# Patient Record
Sex: Female | Born: 1990 | Race: White | Hispanic: No | Marital: Married | Smoking: Never smoker
Health system: Southern US, Community
[De-identification: ages and names within clinical notes are randomized; demographics above are authoritative.]

## PROBLEM LIST (undated history)

## (undated) ENCOUNTER — Emergency Department (HOSPITAL_COMMUNITY): Discharge status: Absent without leave | Payer: Managed Care, Other (non HMO)

## (undated) DIAGNOSIS — F329 Major depressive disorder, single episode, unspecified: Secondary | ICD-10-CM

## (undated) DIAGNOSIS — C801 Malignant (primary) neoplasm, unspecified: Secondary | ICD-10-CM

## (undated) DIAGNOSIS — Z789 Other specified health status: Secondary | ICD-10-CM

## (undated) DIAGNOSIS — F32A Depression, unspecified: Secondary | ICD-10-CM

## (undated) DIAGNOSIS — F419 Anxiety disorder, unspecified: Secondary | ICD-10-CM

## (undated) HISTORY — PX: DILATION AND CURETTAGE OF UTERUS: SHX78

## (undated) HISTORY — PX: NO PAST SURGERIES: SHX2092

## (undated) MED FILL — Dexamethasone Sodium Phosphate Inj 100 MG/10ML: INTRAMUSCULAR | Qty: 1 | Status: AC

## (undated) MED FILL — Fosaprepitant Dimeglumine For IV Infusion 150 MG (Base Eq): INTRAVENOUS | Qty: 5 | Status: AC

---
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
## 2013-05-01 NOTE — L&D Delivery Note (Signed)
Delivery Note  Angel Tate is a 23 y.o. female G1 at 84 0/7 weeks presenting for c/o contractions.  Pregnancy complicated by:  1) Low back pain- tylenol prn  2) Anemia in pregnancy- Hgb 10.7, integra daily, last check 8/27: Hgb 11.6  Pt started having contractions ~ 2 days ago, she has been to MAU x2 for early labor and upon arrival for this admission she was noted to be 4cm dilated with contractions q 1-56min.  Pt received an epidural for pain management.  She was augmented with Pitocin and an IUPC to monitor contractions.  She progressed to complete dilation.  At 4:36 PM a viable and healthy female was delivered via Vaginal, Spontaneous Delivery (Presentation: Left Occiput Anterior).  APGAR: 8, 9; weight 8 lb 1.1 oz (3660 g).   Placenta status: Intact, Spontaneous.  Anesthesia: Epidural  Episiotomy: None Lacerations: 2nd degree Suture Repair: 2.0 3.0 vicryl Est. Blood Loss (mL): 350  Mom to postpartum.  Baby to Couplet care / Skin to Skin.  Janyth Pupa, M 01/22/2014, 8:01 PM

## 2013-06-02 LAB — OB RESULTS CONSOLE HEPATITIS B SURFACE ANTIGEN: Hepatitis B Surface Ag: NEGATIVE

## 2013-06-02 LAB — OB RESULTS CONSOLE ANTIBODY SCREEN: Antibody Screen: NEGATIVE

## 2013-06-02 LAB — OB RESULTS CONSOLE RUBELLA ANTIBODY, IGM: Rubella: IMMUNE

## 2013-06-02 LAB — OB RESULTS CONSOLE ABO/RH: RH Type: POSITIVE

## 2013-06-02 LAB — OB RESULTS CONSOLE HIV ANTIBODY (ROUTINE TESTING): HIV: NONREACTIVE

## 2013-06-02 LAB — OB RESULTS CONSOLE RPR: RPR: NONREACTIVE

## 2013-06-27 ENCOUNTER — Other Ambulatory Visit (HOSPITAL_COMMUNITY)
Admission: RE | Admit: 2013-06-27 | Discharge: 2013-06-27 | Disposition: A | Discharge status: Home / Self Care | Payer: Commercial Indemnity | Source: Ambulatory Visit | Attending: Obstetrics & Gynecology | Admitting: Obstetrics & Gynecology

## 2013-06-27 DIAGNOSIS — Z113 Encounter for screening for infections with a predominantly sexual mode of transmission: Secondary | ICD-10-CM | POA: Insufficient documentation

## 2013-06-27 DIAGNOSIS — Z124 Encounter for screening for malignant neoplasm of cervix: Secondary | ICD-10-CM | POA: Insufficient documentation

## 2013-06-27 DIAGNOSIS — N760 Acute vaginitis: Secondary | ICD-10-CM | POA: Insufficient documentation

## 2013-12-16 LAB — OB RESULTS CONSOLE GBS: GBS: NEGATIVE

## 2014-01-20 ENCOUNTER — Inpatient Hospital Stay (HOSPITAL_COMMUNITY)
Admission: AD | Admit: 2014-01-20 | Discharge: 2014-01-21 | Disposition: A | Discharge status: Home / Self Care | Payer: Managed Care, Other (non HMO) | Source: Ambulatory Visit | Attending: Obstetrics and Gynecology | Admitting: Obstetrics and Gynecology

## 2014-01-20 ENCOUNTER — Encounter (HOSPITAL_COMMUNITY): Payer: Self-pay | Admitting: *Deleted

## 2014-01-20 ENCOUNTER — Inpatient Hospital Stay (HOSPITAL_COMMUNITY): Payer: Managed Care, Other (non HMO)

## 2014-01-20 ENCOUNTER — Inpatient Hospital Stay (HOSPITAL_COMMUNITY)
Admission: AD | Admit: 2014-01-20 | Discharge: 2014-01-20 | Disposition: A | Discharge status: Home / Self Care | Payer: Managed Care, Other (non HMO) | Source: Ambulatory Visit | Attending: Obstetrics and Gynecology | Admitting: Obstetrics and Gynecology

## 2014-01-20 ENCOUNTER — Telehealth (HOSPITAL_COMMUNITY): Payer: Self-pay | Admitting: *Deleted

## 2014-01-20 DIAGNOSIS — O479 False labor, unspecified: Secondary | ICD-10-CM | POA: Insufficient documentation

## 2014-01-20 DIAGNOSIS — O9989 Other specified diseases and conditions complicating pregnancy, childbirth and the puerperium: Principal | ICD-10-CM

## 2014-01-20 DIAGNOSIS — O4290 Premature rupture of membranes, unspecified as to length of time between rupture and onset of labor, unspecified weeks of gestation: Secondary | ICD-10-CM

## 2014-01-20 DIAGNOSIS — O99891 Other specified diseases and conditions complicating pregnancy: Secondary | ICD-10-CM | POA: Insufficient documentation

## 2014-01-20 HISTORY — DX: Other specified health status: Z78.9

## 2014-01-20 LAB — AMNISURE RUPTURE OF MEMBRANE (ROM) NOT AT ARMC: Amnisure ROM: NEGATIVE

## 2014-01-20 LAB — OB RESULTS CONSOLE GC/CHLAMYDIA
Chlamydia: NEGATIVE
Gonorrhea: NEGATIVE

## 2014-01-20 NOTE — MAU Note (Signed)
Pt reports having several episiodes of leaking today. Sent from office.

## 2014-01-20 NOTE — Telephone Encounter (Signed)
Preadmission screen  

## 2014-01-20 NOTE — H&P (Signed)
HPI: 23 y/o G1P0 @ [redacted]w[redacted]d estimated gestational age (as dated by LMP c/w 20 week ultrasound) who was seen in the office today and reports slow leaking of fluid that has continued every time she stands up.  In office, unable to confirm rupture due to bloody show though findings suggestive of gross rupture.  Irregular contractions, + Fetal Movement.  ROS: no HA, no epigastric pain, no visual changes.    Pregnancy complicated by: 1) Low back pain- tylenol prn 2) Anemia in pregnancy- Hgb 10.7, integra daily, last check 8/27: Hgb 11.6   Prenatal Transfer Tool  Maternal Diabetes: No Genetic Screening: Normal Maternal Ultrasounds/Referrals: Normal Fetal Ultrasounds or other Referrals:  None Maternal Substance Abuse:  No Significant Maternal Medications:  None Significant Maternal Lab Results: Lab values include: Group B Strep negative   PNL:  GBS negaitve, Rub Immune, Hep B neg, RPR NR, HIV neg, GC/C neg, glucola:normal Tetra negative H/H: 11.6/35.6 Blood type: O positive Rc'd Tdap Last [place] @ 28wk (concern for S<D)- vertex, anterior, AFI wnl, EFW: 66%, female  OBHx: primip PMHx:  Low back pain- xray 03/2013 irregular lordosis Meds:  PNV, tylenol prn, pepcid prn for indigestion Allergy:  NKDA SurgHx: no SocHx:   no Tobacco, no  EtOH, no Illicit Drugs  O: LMP 61/60/7371 in office: BP 118/77, Wt: 170lbs Gen. AAOx3, NAD Abd. Gravid,  no tenderness,  no rigidity,  no guarding Extr.  no edema B/L , no calf tenderness bilaterally FHT: 140 by doppler SSE: Slight pooling of bloody/clear discharge, cervix does not appear dilated SVE: 1/25/-3, vertex on exam with palpable suture, no bag appreciated  Labs: see orders  A/P:  23 y.o. G1P0 @ [redacted]w[redacted]d EGA who presents for rule out PROM -FWB: continous monitoring, reassuring by doppler in office -Rule out PROM: plan for amnisure.  If confirmed rupture will admit with plans for Pitocin -GBS: negative -Anemia in pregnancy- CBC, T&S to be drawn for  admission -Dr. Landry Mellow to manage until 7pm, [facility] for care this evening  Janyth Pupa, DO [phone] (pager) [phone] (office)

## 2014-01-20 NOTE — MAU Note (Signed)
Worsening contractions tonight.

## 2014-01-20 NOTE — MAU Note (Signed)
Pt to be reassessed in 1 hour after walking.

## 2014-01-21 ENCOUNTER — Other Ambulatory Visit: Payer: Self-pay | Admitting: Obstetrics and Gynecology

## 2014-01-21 ENCOUNTER — Inpatient Hospital Stay (HOSPITAL_COMMUNITY): Payer: Managed Care, Other (non HMO) | Admitting: Anesthesiology

## 2014-01-21 ENCOUNTER — Encounter (HOSPITAL_COMMUNITY): Payer: Self-pay | Admitting: *Deleted

## 2014-01-21 ENCOUNTER — Encounter (HOSPITAL_COMMUNITY): Payer: Self-pay

## 2014-01-21 ENCOUNTER — Inpatient Hospital Stay (HOSPITAL_COMMUNITY)
Admission: AD | Admit: 2014-01-21 | Discharge: 2014-01-24 | DRG: 775 | Disposition: A | Discharge status: Home / Self Care | Payer: Managed Care, Other (non HMO) | Source: Ambulatory Visit | Attending: Obstetrics & Gynecology | Admitting: Obstetrics & Gynecology

## 2014-01-21 ENCOUNTER — Inpatient Hospital Stay (HOSPITAL_COMMUNITY)
Admission: AD | Admit: 2014-01-21 | Discharge: 2014-01-21 | Disposition: A | Discharge status: Home / Self Care | Payer: Managed Care, Other (non HMO) | Source: Ambulatory Visit | Attending: Obstetrics & Gynecology | Admitting: Obstetrics & Gynecology

## 2014-01-21 ENCOUNTER — Encounter (HOSPITAL_COMMUNITY): Payer: Managed Care, Other (non HMO) | Admitting: Anesthesiology

## 2014-01-21 DIAGNOSIS — O9902 Anemia complicating childbirth: Secondary | ICD-10-CM | POA: Diagnosis present

## 2014-01-21 DIAGNOSIS — O479 False labor, unspecified: Secondary | ICD-10-CM | POA: Insufficient documentation

## 2014-01-21 DIAGNOSIS — D649 Anemia, unspecified: Secondary | ICD-10-CM | POA: Diagnosis present

## 2014-01-21 DIAGNOSIS — Z825 Family history of asthma and other chronic lower respiratory diseases: Secondary | ICD-10-CM

## 2014-01-21 LAB — CBC
HCT: 36.9 % (ref 36.0–46.0)
Hemoglobin: 12.3 g/dL (ref 12.0–15.0)
MCH: 30.4 pg (ref 26.0–34.0)
MCHC: 33.3 g/dL (ref 30.0–36.0)
MCV: 91.1 fL (ref 78.0–100.0)
Platelets: 211 10*3/uL (ref 150–400)
RBC: 4.05 MIL/uL (ref 3.87–5.11)
RDW: 14.1 % (ref 11.5–15.5)
WBC: 19.5 10*3/uL — ABNORMAL HIGH (ref 4.0–10.5)

## 2014-01-21 LAB — TYPE AND SCREEN
ABO/RH(D): O POS
Antibody Screen: NEGATIVE

## 2014-01-21 MED ORDER — OXYCODONE-ACETAMINOPHEN 5-325 MG PO TABS
2.0000 | ORAL_TABLET | ORAL | Status: DC | PRN
  Administered 2014-01-22: 2 via ORAL
  Filled 2014-01-21: qty 2

## 2014-01-21 MED ORDER — TERBUTALINE SULFATE 1 MG/ML IJ SOLN: 0.2500 mg | Freq: Once | INTRAMUSCULAR | Status: DC | PRN

## 2014-01-21 MED ORDER — LACTATED RINGERS IV SOLN: 500.0000 mL | INTRAVENOUS | Status: DC | PRN

## 2014-01-21 MED ORDER — BUTORPHANOL TARTRATE 1 MG/ML IJ SOLN
1.0000 mg | Freq: Once | INTRAMUSCULAR | Status: AC
  Administered 2014-01-21: 1 mg via INTRAMUSCULAR
  Filled 2014-01-21: qty 1

## 2014-01-21 MED ORDER — LACTATED RINGERS IV SOLN
INTRAVENOUS | Status: DC
  Administered 2014-01-21 – 2014-01-22 (×3): via INTRAVENOUS

## 2014-01-21 MED ORDER — ONDANSETRON HCL 4 MG/2ML IJ SOLN: 4.0000 mg | Freq: Four times a day (QID) | INTRAMUSCULAR | Status: DC | PRN

## 2014-01-21 MED ORDER — PHENYLEPHRINE 40 MCG/ML (10ML) SYRINGE FOR IV PUSH (FOR BLOOD PRESSURE SUPPORT): 80.0000 ug | PREFILLED_SYRINGE | INTRAVENOUS | Status: DC | PRN

## 2014-01-21 MED ORDER — HYDROXYZINE HCL 50 MG PO TABS: 50.0000 mg | ORAL_TABLET | Freq: Four times a day (QID) | ORAL | Status: DC | PRN

## 2014-01-21 MED ORDER — PHENYLEPHRINE 40 MCG/ML (10ML) SYRINGE FOR IV PUSH (FOR BLOOD PRESSURE SUPPORT)
80.0000 ug | PREFILLED_SYRINGE | INTRAVENOUS | Status: DC | PRN
  Filled 2014-01-21: qty 10

## 2014-01-21 MED ORDER — LIDOCAINE HCL (PF) 1 % IJ SOLN
30.0000 mL | INTRAMUSCULAR | Status: DC | PRN
  Filled 2014-01-21: qty 30

## 2014-01-21 MED ORDER — OXYCODONE-ACETAMINOPHEN 5-325 MG PO TABS: 1.0000 | ORAL_TABLET | ORAL | Status: DC | PRN

## 2014-01-21 MED ORDER — OXYTOCIN 40 UNITS IN LACTATED RINGERS INFUSION - SIMPLE MED: 62.5000 mL/h | INTRAVENOUS | Status: DC

## 2014-01-21 MED ORDER — OXYTOCIN 40 UNITS IN LACTATED RINGERS INFUSION - SIMPLE MED
1.0000 m[IU]/min | INTRAVENOUS | Status: DC
  Administered 2014-01-21: 1 m[IU]/min via INTRAVENOUS
  Administered 2014-01-21: 2 m[IU]/min via INTRAVENOUS
  Filled 2014-01-21: qty 1000

## 2014-01-21 MED ORDER — LIDOCAINE HCL (PF) 1 % IJ SOLN
INTRAMUSCULAR | Status: DC | PRN
  Administered 2014-01-21 (×2): 5 mL

## 2014-01-21 MED ORDER — EPHEDRINE 5 MG/ML INJ: 10.0000 mg | INTRAVENOUS | Status: DC | PRN

## 2014-01-21 MED ORDER — FENTANYL 2.5 MCG/ML BUPIVACAINE 1/10 % EPIDURAL INFUSION (WH - ANES)
14.0000 mL/h | INTRAMUSCULAR | Status: DC | PRN
  Administered 2014-01-21 – 2014-01-22 (×4): 14 mL/h via EPIDURAL
  Filled 2014-01-21 (×4): qty 125

## 2014-01-21 MED ORDER — BUTORPHANOL TARTRATE 1 MG/ML IJ SOLN: 1.0000 mg | INTRAMUSCULAR | Status: DC | PRN

## 2014-01-21 MED ORDER — CITRIC ACID-SODIUM CITRATE 334-500 MG/5ML PO SOLN
30.0000 mL | ORAL | Status: DC | PRN
Start: 2014-01-21 — End: 2014-01-22

## 2014-01-21 MED ORDER — ZOLPIDEM TARTRATE 5 MG PO TABS
5.0000 mg | ORAL_TABLET | Freq: Once | ORAL | Status: AC
  Administered 2014-01-21: 5 mg via ORAL
  Filled 2014-01-21: qty 1

## 2014-01-21 MED ORDER — DIPHENHYDRAMINE HCL 50 MG/ML IJ SOLN: 12.5000 mg | INTRAMUSCULAR | Status: DC | PRN

## 2014-01-21 MED ORDER — OXYTOCIN BOLUS FROM INFUSION: 500.0000 mL | INTRAVENOUS | Status: DC

## 2014-01-21 MED ORDER — MORPHINE SULFATE 4 MG/ML IJ SOLN
4.0000 mg | Freq: Once | INTRAMUSCULAR | Status: AC
  Administered 2014-01-21: 4 mg via INTRAMUSCULAR
  Filled 2014-01-21: qty 1

## 2014-01-21 MED ORDER — LACTATED RINGERS IV SOLN
500.0000 mL | Freq: Once | INTRAVENOUS | Status: AC
  Administered 2014-01-21: 500 mL via INTRAVENOUS

## 2014-01-21 MED ORDER — ACETAMINOPHEN 325 MG PO TABS: 650.0000 mg | ORAL_TABLET | ORAL | Status: DC | PRN

## 2014-01-21 NOTE — Discharge Instructions (Signed)
Fetal Movement Counts °Patient Name: __________________________________________________ Patient Due Date: ____________________ °Performing a fetal movement count is highly recommended in high-risk pregnancies, but it is good for every pregnant woman to do. Your health care provider may ask you to start counting fetal movements at 28 weeks of the pregnancy. Fetal movements often increase: °· After eating a full meal. °· After physical activity. °· After eating or drinking something sweet or cold. °· At rest. °Pay attention to when you feel the baby is most active. This will help you notice a pattern of your baby's sleep and wake cycles and what factors contribute to an increase in fetal movement. It is important to perform a fetal movement count at the same time each day when your baby is normally most active.  °HOW TO COUNT FETAL MOVEMENTS °1. Find a quiet and comfortable area to sit or lie down on your left side. Lying on your left side provides the best blood and oxygen circulation to your baby. °2. Write down the day and time on a sheet of paper or in a journal. °3. Start counting kicks, flutters, swishes, rolls, or jabs in a 2-hour period. You should feel at least 10 movements within 2 hours. °4. If you do not feel 10 movements in 2 hours, wait 2-3 hours and count again. Look for a change in the pattern or not enough counts in 2 hours. °SEEK MEDICAL CARE IF: °· You feel less than 10 counts in 2 hours, tried twice. °· There is no movement in over an hour. °· The pattern is changing or taking longer each day to reach 10 counts in 2 hours. °· You feel the baby is not moving as he or she usually does. °Date: ____________ Movements: ____________ Start time: ____________ Finish time: ____________  °Date: ____________ Movements: ____________ Start time: ____________ Finish time: ____________ °Date: ____________ Movements: ____________ Start time: ____________ Finish time: ____________ °Date: ____________ Movements:  ____________ Start time: ____________ Finish time: ____________ °Date: ____________ Movements: ____________ Start time: ____________ Finish time: ____________ °Date: ____________ Movements: ____________ Start time: ____________ Finish time: ____________ °Date: ____________ Movements: ____________ Start time: ____________ Finish time: ____________ °Date: ____________ Movements: ____________ Start time: ____________ Finish time: ____________  °Date: ____________ Movements: ____________ Start time: ____________ Finish time: ____________ °Date: ____________ Movements: ____________ Start time: ____________ Finish time: ____________ °Date: ____________ Movements: ____________ Start time: ____________ Finish time: ____________ °Date: ____________ Movements: ____________ Start time: ____________ Finish time: ____________ °Date: ____________ Movements: ____________ Start time: ____________ Finish time: ____________ °Date: ____________ Movements: ____________ Start time: ____________ Finish time: ____________ °Date: ____________ Movements: ____________ Start time: ____________ Finish time: ____________  °Date: ____________ Movements: ____________ Start time: ____________ Finish time: ____________ °Date: ____________ Movements: ____________ Start time: ____________ Finish time: ____________ °Date: ____________ Movements: ____________ Start time: ____________ Finish time: ____________ °Date: ____________ Movements: ____________ Start time: ____________ Finish time: ____________ °Date: ____________ Movements: ____________ Start time: ____________ Finish time: ____________ °Date: ____________ Movements: ____________ Start time: ____________ Finish time: ____________ °Date: ____________ Movements: ____________ Start time: ____________ Finish time: ____________  °Date: ____________ Movements: ____________ Start time: ____________ Finish time: ____________ °Date: ____________ Movements: ____________ Start time: ____________ Finish  time: ____________ °Date: ____________ Movements: ____________ Start time: ____________ Finish time: ____________ °Date: ____________ Movements: ____________ Start time: ____________ Finish time: ____________ °Date: ____________ Movements: ____________ Start time: ____________ Finish time: ____________ °Date: ____________ Movements: ____________ Start time: ____________ Finish time: ____________ °Date: ____________ Movements: ____________ Start time: ____________ Finish time: ____________  °Date: ____________ Movements: ____________ Start time: ____________ Finish   time: ____________ °Date: ____________ Movements: ____________ Start time: ____________ Finish time: ____________ °Date: ____________ Movements: ____________ Start time: ____________ Finish time: ____________ °Date: ____________ Movements: ____________ Start time: ____________ Finish time: ____________ °Date: ____________ Movements: ____________ Start time: ____________ Finish time: ____________ °Date: ____________ Movements: ____________ Start time: ____________ Finish time: ____________ °Date: ____________ Movements: ____________ Start time: ____________ Finish time: ____________  °Date: ____________ Movements: ____________ Start time: ____________ Finish time: ____________ °Date: ____________ Movements: ____________ Start time: ____________ Finish time: ____________ °Date: ____________ Movements: ____________ Start time: ____________ Finish time: ____________ °Date: ____________ Movements: ____________ Start time: ____________ Finish time: ____________ °Date: ____________ Movements: ____________ Start time: ____________ Finish time: ____________ °Date: ____________ Movements: ____________ Start time: ____________ Finish time: ____________ °Date: ____________ Movements: ____________ Start time: ____________ Finish time: ____________  °Date: ____________ Movements: ____________ Start time: ____________ Finish time: ____________ °Date: ____________  Movements: ____________ Start time: ____________ Finish time: ____________ °Date: ____________ Movements: ____________ Start time: ____________ Finish time: ____________ °Date: ____________ Movements: ____________ Start time: ____________ Finish time: ____________ °Date: ____________ Movements: ____________ Start time: ____________ Finish time: ____________ °Date: ____________ Movements: ____________ Start time: ____________ Finish time: ____________ °Date: ____________ Movements: ____________ Start time: ____________ Finish time: ____________  °Date: ____________ Movements: ____________ Start time: ____________ Finish time: ____________ °Date: ____________ Movements: ____________ Start time: ____________ Finish time: ____________ °Date: ____________ Movements: ____________ Start time: ____________ Finish time: ____________ °Date: ____________ Movements: ____________ Start time: ____________ Finish time: ____________ °Date: ____________ Movements: ____________ Start time: ____________ Finish time: ____________ °Date: ____________ Movements: ____________ Start time: ____________ Finish time: ____________ °Document Released: 05/17/2006 Document Revised: 09/01/2013 Document Reviewed: 02/12/2012 °[employer]® Patient Information ©2015 [employer]. This information is not intended to replace advice given to you by your health care provider. Make sure you discuss any questions you have with your health care provider. °Third Trimester of Pregnancy °The third trimester is from week 29 through week 42, months 7 through 9. The third trimester is a time when the fetus is growing rapidly. At the end of the ninth month, the fetus is about 20 inches in length and weighs 6-10 pounds.  °BODY CHANGES °Your body goes through many changes during pregnancy. The changes vary from woman to woman.  °· Your weight will continue to increase. You can expect to gain 25-35 pounds (11-16 kg) by the end of the pregnancy. °· You may begin to get  stretch marks on your hips, abdomen, and breasts. °· You may urinate more often because the fetus is moving lower into your pelvis and pressing on your bladder. °· You may develop or continue to have heartburn as a result of your pregnancy. °· You may develop constipation because certain hormones are causing the muscles that push waste through your intestines to slow down. °· You may develop hemorrhoids or swollen, bulging veins (varicose veins). °· You may have pelvic pain because of the weight gain and pregnancy hormones relaxing your joints between the bones in your pelvis. Backaches may result from overexertion of the muscles supporting your posture. °· You may have changes in your hair. These can include thickening of your hair, rapid growth, and changes in texture. Some women also have hair loss during or after pregnancy, or hair that feels dry or thin. Your hair will most likely return to normal after your baby is born. °· Your breasts will continue to grow and be tender. A yellow discharge may leak from your breasts called colostrum. °· Your belly button may stick out. °· You may feel short of   breath because of your expanding uterus. °· You may notice the fetus "dropping," or moving lower in your abdomen. °· You may have a bloody mucus discharge. This usually occurs a few days to a week before labor begins. °· Your cervix becomes thin and soft (effaced) near your due date. °WHAT TO EXPECT AT YOUR PRENATAL EXAMS  °You will have prenatal exams every 2 weeks until week 36. Then, you will have weekly prenatal exams. During a routine prenatal visit: °· You will be weighed to make sure you and the fetus are growing normally. °· Your blood pressure is taken. °· Your abdomen will be measured to track your baby's growth. °· The fetal heartbeat will be listened to. °· Any test results from the previous visit will be discussed. °· You may have a cervical check near your due date to see if you have effaced. °At around  36 weeks, your caregiver will check your cervix. At the same time, your caregiver will also perform a test on the secretions of the vaginal tissue. This test is to determine if a type of bacteria, Group B streptococcus, is present. Your caregiver will explain this further. °Your caregiver may ask you: °· What your birth plan is. °· How you are feeling. °· If you are feeling the baby move. °· If you have had any abnormal symptoms, such as leaking fluid, bleeding, severe headaches, or abdominal cramping. °· If you have any questions. °Other tests or screenings that may be performed during your third trimester include: °· Blood tests that check for low iron levels (anemia). °· Fetal testing to check the health, activity level, and growth of the fetus. Testing is done if you have certain medical conditions or if there are problems during the pregnancy. °FALSE LABOR °You may feel small, irregular contractions that eventually go away. These are called Braxton Hicks contractions, or false labor. Contractions may last for hours, days, or even weeks before true labor sets in. If contractions come at regular intervals, intensify, or become painful, it is best to be seen by your caregiver.  °SIGNS OF LABOR  °· Menstrual-like cramps. °· Contractions that are 5 minutes apart or less. °· Contractions that start on the top of the uterus and spread down to the lower abdomen and back. °· A sense of increased pelvic pressure or back pain. °· A watery or bloody mucus discharge that comes from the vagina. °If you have any of these signs before the 37th week of pregnancy, call your caregiver right away. You need to go to the hospital to get checked immediately. °HOME CARE INSTRUCTIONS  °· Avoid all smoking, herbs, alcohol, and unprescribed drugs. These chemicals affect the formation and growth of the baby. °· Follow your caregiver's instructions regarding medicine use. There are medicines that are either safe or unsafe to take during  pregnancy. °· Exercise only as directed by your caregiver. Experiencing uterine cramps is a good sign to stop exercising. °· Continue to eat regular, healthy meals. °· Wear a good support bra for breast tenderness. °· Do not use hot tubs, steam rooms, or saunas. °· Wear your seat belt at all times when driving. °· Avoid raw meat, uncooked cheese, cat litter boxes, and soil used by cats. These carry germs that can cause birth defects in the baby. °· Take your prenatal vitamins. °· Try taking a stool softener (if your caregiver approves) if you develop constipation. Eat more high-fiber foods, such as fresh vegetables or fruit and whole grains. Drink   plenty of fluids to keep your urine clear or pale yellow. °· Take warm sitz baths to soothe any pain or discomfort caused by hemorrhoids. Use hemorrhoid cream if your caregiver approves. °· If you develop varicose veins, wear support hose. Elevate your feet for 15 minutes, 3-4 times a day. Limit salt in your diet. °· Avoid heavy lifting, wear low heal shoes, and practice good posture. °· Rest a lot with your legs elevated if you have leg cramps or low back pain. °· Visit your dentist if you have not gone during your pregnancy. Use a soft toothbrush to brush your teeth and be gentle when you floss. °· A sexual relationship may be continued unless your caregiver directs you otherwise. °· Do not travel far distances unless it is absolutely necessary and only with the approval of your caregiver. °· Take prenatal classes to understand, practice, and ask questions about the labor and delivery. °· Make a trial run to the hospital. °· Pack your hospital bag. °· Prepare the baby's nursery. °· Continue to go to all your prenatal visits as directed by your caregiver. °SEEK MEDICAL CARE IF: °· You are unsure if you are in labor or if your water has broken. °· You have dizziness. °· You have mild pelvic cramps, pelvic pressure, or nagging pain in your abdominal area. °· You have  persistent nausea, vomiting, or diarrhea. °· You have a bad smelling vaginal discharge. °· You have pain with urination. °SEEK IMMEDIATE MEDICAL CARE IF:  °· You have a fever. °· You are leaking fluid from your vagina. °· You have spotting or bleeding from your vagina. °· You have severe abdominal cramping or pain. °· You have rapid weight loss or gain. °· You have shortness of breath with chest pain. °· You notice sudden or extreme swelling of your face, hands, ankles, feet, or legs. °· You have not felt your baby move in over an hour. °· You have severe headaches that do not go away with medicine. °· You have vision changes. °Document Released: 04/11/2001 Document Revised: 04/22/2013 Document Reviewed: 06/18/2012 °[employer]® Patient Information ©2015 [employer]. This information is not intended to replace advice given to you by your health care provider. Make sure you discuss any questions you have with your health care provider. ° °

## 2014-01-21 NOTE — Discharge Instructions (Signed)
Braxton Hicks Contractions °Contractions of the uterus can occur throughout pregnancy. Contractions are not always a sign that you are in labor.  °WHAT ARE BRAXTON HICKS CONTRACTIONS?  °Contractions that occur before labor are called Braxton Hicks contractions, or false labor. Toward the end of pregnancy (32-34 weeks), these contractions can develop more often and may become more forceful. This is not true labor because these contractions do not result in opening (dilatation) and thinning of the cervix. They are sometimes difficult to tell apart from true labor because these contractions can be forceful and people have different pain tolerances. You should not feel embarrassed if you go to the hospital with false labor. Sometimes, the only way to tell if you are in true labor is for your health care provider to look for changes in the cervix. °If there are no prenatal problems or other health problems associated with the pregnancy, it is completely safe to be sent home with false labor and await the onset of true labor. °HOW CAN YOU TELL THE DIFFERENCE BETWEEN TRUE AND FALSE LABOR? °False Labor °· The contractions of false labor are usually shorter and not as hard as those of true labor.   °· The contractions are usually irregular.   °· The contractions are often felt in the front of the lower abdomen and in the groin.   °· The contractions may go away when you walk around or change positions while lying down.   °· The contractions get weaker and are shorter lasting as time goes on.   °· The contractions do not usually become progressively stronger, regular, and closer together as with true labor.   °True Labor °· Contractions in true labor last 30-70 seconds, become very regular, usually become more intense, and increase in frequency.   °· The contractions do not go away with walking.   °· The discomfort is usually felt in the top of the uterus and spreads to the lower abdomen and low back.   °· True labor can be  determined by your health care provider with an exam. This will show that the cervix is dilating and getting thinner.   °WHAT TO REMEMBER °· Keep up with your usual exercises and follow other instructions given by your health care provider.   °· Take medicines as directed by your health care provider.   °· Keep your regular prenatal appointments.   °· Eat and drink lightly if you think you are going into labor.   °· If Braxton Hicks contractions are making you uncomfortable:   °¨ Change your position from lying down or resting to walking, or from walking to resting.   °¨ Sit and rest in a tub of warm water.   °¨ Drink 2-3 glasses of water. Dehydration may cause these contractions.   °¨ Do slow and deep breathing several times an hour.   °WHEN SHOULD I SEEK IMMEDIATE MEDICAL CARE? °Seek immediate medical care if: °· Your contractions become stronger, more regular, and closer together.   °· You have fluid leaking or gushing from your vagina.   °· You have a fever.   °· You pass blood-tinged mucus.   °· You have vaginal bleeding.   °· You have continuous abdominal pain.   °· You have low back pain that you never had before.   °· You feel your baby's head pushing down and causing pelvic pressure.   °· Your baby is not moving as much as it used to.   °Document Released: 04/17/2005 Document Revised: 04/22/2013 Document Reviewed: 01/27/2013 °[employer]® Patient Information ©2015 [employer]. This information is not intended to replace advice given to you by your health care   provider. Make sure you discuss any questions you have with your health care provider. ° °

## 2014-01-21 NOTE — Anesthesia Procedure Notes (Signed)
Epidural Patient location during procedure: OB Start time: 01/21/2014 8:34 PM  Staffing Anesthesiologist: Rudean Curt Performed by: anesthesiologist   Preanesthetic Checklist Completed: patient identified, site marked, surgical consent, pre-op evaluation, timeout performed, IV checked, risks and benefits discussed and monitors and equipment checked  Epidural Patient position: sitting Prep: site prepped and draped and DuraPrep Patient monitoring: continuous pulse ox and blood pressure Approach: midline Location: L3-L4 Injection technique: LOR air  Needle:  Needle type: Tuohy  Needle gauge: 17 G Needle length: 9 cm and 9 Needle insertion depth: 4 cm Catheter type: closed end flexible Catheter size: 19 Gauge Catheter at skin depth: 10 cm Test dose: negative  Assessment Events: blood not aspirated, injection not painful, no injection resistance, negative IV test and no paresthesia  Additional Notes Patient identified.  Risk benefits discussed including failed block, incomplete pain control, headache, nerve damage, paralysis, blood pressure changes, nausea, vomiting, reactions to medication both toxic or allergic, and postpartum back pain.  Patient expressed understanding and wished to proceed.  All questions were answered.  Sterile technique used throughout procedure and epidural site dressed with sterile barrier dressing. No paresthesia or other complications noted.The patient did not experience any signs of intravascular injection such as tinnitus or metallic taste in mouth nor signs of intrathecal spread such as rapid motor block. Please see nursing notes for vital signs.

## 2014-01-21 NOTE — Anesthesia Preprocedure Evaluation (Signed)

## 2014-01-21 NOTE — MAU Note (Signed)
Pt to receive medication for pain and then be reassessed after an hour.

## 2014-01-21 NOTE — MAU Note (Signed)
Patients presents to MAU with c/o contractions every 3-5 minutes; reports bloody show. Denies LOF at this time. + FM. States was seen in MAU last night and was 2cm.

## 2014-01-21 NOTE — MAU Provider Note (Signed)
History    Angel Tate is a 23y.o. G1P0 at 40 wks who presents, after calling, reporting increase in contraction frequency and intensity.  During phone conversation, patient reported fetal movement, bloody show, and contractions, but denied LOF.  Patient informed of early labor and instructed to call back if contractions increased in frequency and/or intensity.  Patient seen earlier in office & MAU for suspected ROM, but Amnisure returned negative.  Patient presents to MAU and to be evaluated by RN.    There are no active problems to display for this patient.   Chief Complaint  Patient presents with  . Labor Eval   HPI  OB History   Grav Para Term Preterm Abortions TAB SAB Ect Mult Living   1               Past Medical History  Diagnosis Date  . Medical history non-contributory     Past Surgical History  Procedure Laterality Date  . No past surgeries      Family History  Problem Relation Age of Onset  . Hypertension Maternal Grandmother   . Asthma Maternal Aunt     History  Substance Use Topics  . Smoking status: Never Smoker   . Smokeless tobacco: Not on file  . Alcohol Use: No    Allergies: No Known Allergies  Prescriptions prior to admission  Medication Sig Dispense Refill  . acetaminophen (TYLENOL) 500 MG tablet Take 1,000 mg by mouth once.      . bisacodyl (DULCOLAX) 5 MG EC tablet Take 10 mg by mouth daily as needed for mild constipation.      . calcium carbonate (TUMS - DOSED IN MG ELEMENTAL CALCIUM) 500 MG chewable tablet Chew 2 tablets by mouth daily as needed for indigestion or heartburn.      . famotidine (PEPCID) 20 MG tablet Take 20 mg by mouth 2 (two) times daily as needed for heartburn or indigestion.      . Prenatal Vit-Fe Fumarate-FA (PRENATAL MULTIVITAMIN) TABS tablet Take 1 tablet by mouth daily at 12 noon.        ROS  See HPI Above Physical Exam   Blood pressure 129/73, pulse 93, temperature 98 F (36.7 C), temperature source Oral,  resp. rate 20, height 5\' 4"  (1.626 m), weight 170 lb (77.111 kg), last menstrual period 04/16/2013, SpO2 98.00%.  Physical Exam SVE: 2/60/-2, Bloody Show per RN FHR: 135 bpm, Mod Var, -Decels, +Accels UC: Q2-49mins  ED Course  Assessment: IUP at 40wks Early Labor  Plan: -Nurse call to report VE -Instructed to have patient ambulate in hallway -Reassess cervical status and contraction pattern in 1-2hrs  Follow Up (0130) -SVE the same per nurse -Instructed to offer therapeutic rest and observe per protocol -Therapeutic Rest accepted: Morphine IM & Ambien  Follow Up (0340) -Strip Reviewed -Reassuring, min variability most likely due to pain medication -Discharge to home with precautions  Rhyen Mazariego LYNN CNM, MSN 01/21/2014 3:53 AM

## 2014-01-21 NOTE — MAU Note (Signed)
Patient states she is having contractions every 3-5 minutes with bloody show. Reports good fetal movement.

## 2014-01-22 ENCOUNTER — Encounter (HOSPITAL_COMMUNITY): Payer: Self-pay | Admitting: *Deleted

## 2014-01-22 LAB — RPR

## 2014-01-22 LAB — ABO/RH: ABO/RH(D): O POS

## 2014-01-22 MED ORDER — WITCH HAZEL-GLYCERIN EX PADS: 1.0000 "application " | MEDICATED_PAD | CUTANEOUS | Status: DC | PRN

## 2014-01-22 MED ORDER — ONDANSETRON HCL 4 MG PO TABS: 4.0000 mg | ORAL_TABLET | ORAL | Status: DC | PRN

## 2014-01-22 MED ORDER — LIDOCAINE-EPINEPHRINE (PF) 2 %-1:200000 IJ SOLN
20.0000 mL | Freq: Once | INTRAMUSCULAR | Status: AC
  Administered 2014-01-22: 20 mL

## 2014-01-22 MED ORDER — LANOLIN HYDROUS EX OINT: TOPICAL_OINTMENT | CUTANEOUS | Status: DC | PRN

## 2014-01-22 MED ORDER — FAMOTIDINE 20 MG PO TABS: 20.0000 mg | ORAL_TABLET | Freq: Two times a day (BID) | ORAL | Status: DC | PRN

## 2014-01-22 MED ORDER — BENZOCAINE-MENTHOL 20-0.5 % EX AERO
1.0000 "application " | INHALATION_SPRAY | CUTANEOUS | Status: DC | PRN
  Administered 2014-01-22: 1 via TOPICAL
  Filled 2014-01-22: qty 56

## 2014-01-22 MED ORDER — DIBUCAINE 1 % RE OINT: 1.0000 "application " | TOPICAL_OINTMENT | RECTAL | Status: DC | PRN

## 2014-01-22 MED ORDER — INFLUENZA VAC SPLIT QUAD 0.5 ML IM SUSY: 0.5000 mL | PREFILLED_SYRINGE | INTRAMUSCULAR | Status: DC | PRN

## 2014-01-22 MED ORDER — PRENATAL MULTIVITAMIN CH
1.0000 | ORAL_TABLET | Freq: Every day | ORAL | Status: DC
  Administered 2014-01-23: 1 via ORAL
  Filled 2014-01-22: qty 1

## 2014-01-22 MED ORDER — ONDANSETRON HCL 4 MG/2ML IJ SOLN: 4.0000 mg | INTRAMUSCULAR | Status: DC | PRN

## 2014-01-22 MED ORDER — OXYCODONE-ACETAMINOPHEN 5-325 MG PO TABS
1.0000 | ORAL_TABLET | ORAL | Status: DC | PRN
  Administered 2014-01-23: 1 via ORAL
  Filled 2014-01-22: qty 1

## 2014-01-22 MED ORDER — SENNOSIDES-DOCUSATE SODIUM 8.6-50 MG PO TABS
2.0000 | ORAL_TABLET | ORAL | Status: DC
  Administered 2014-01-22 – 2014-01-24 (×2): 2 via ORAL
  Filled 2014-01-22 (×2): qty 2

## 2014-01-22 MED ORDER — SODIUM BICARBONATE 8.4 % IV SOLN
INTRAVENOUS | Status: DC | PRN
  Administered 2014-01-22: 5 mL via EPIDURAL

## 2014-01-22 MED ORDER — ZOLPIDEM TARTRATE 5 MG PO TABS
5.0000 mg | ORAL_TABLET | Freq: Every evening | ORAL | Status: DC | PRN
Start: 2014-01-22 — End: 2014-01-24

## 2014-01-22 MED ORDER — BUPIVACAINE HCL (PF) 0.25 % IJ SOLN
INTRAMUSCULAR | Status: DC | PRN
  Administered 2014-01-22 (×2): 10 mL via EPIDURAL

## 2014-01-22 MED ORDER — DIPHENHYDRAMINE HCL 25 MG PO CAPS: 25.0000 mg | ORAL_CAPSULE | Freq: Four times a day (QID) | ORAL | Status: DC | PRN

## 2014-01-22 MED ORDER — SIMETHICONE 80 MG PO CHEW: 80.0000 mg | CHEWABLE_TABLET | ORAL | Status: DC | PRN

## 2014-01-22 MED ORDER — IBUPROFEN 600 MG PO TABS
600.0000 mg | ORAL_TABLET | Freq: Four times a day (QID) | ORAL | Status: DC
  Administered 2014-01-22 – 2014-01-24 (×7): 600 mg via ORAL
  Filled 2014-01-22 (×7): qty 1

## 2014-01-22 MED ORDER — OXYCODONE-ACETAMINOPHEN 5-325 MG PO TABS: 2.0000 | ORAL_TABLET | ORAL | Status: DC | PRN

## 2014-01-22 NOTE — Progress Notes (Signed)
OB PN:  S: Pt resting comfortably with epidural, no acute complaints  O: BP 108/49  Pulse 93  Temp(Src) 97.8 F (36.6 C) (Oral)  Resp 20  Ht 5\' 4"  (1.626 m)  Wt 77.111 kg (170 lb)  BMI 29.17 kg/m2  SpO2 99%  LMP 04/16/2013  FHT: 135bpm, moderate variablity, + accels, no decels Toco: q32min, now with coupling, MVU adequate SVE: C/C/+1  A/P: 23 y.o. G1P0 @ [redacted]w[redacted]d admitted for labor 1. FWB: Cat. I 2. Labor: continue Pit per protocol, IUPC in place-adequate contractions.  Once further descent will start pushing Pain: continue epidural GBS: negative  Janyth Pupa, DO [phone] (pager) [phone] (office)

## 2014-01-22 NOTE — Progress Notes (Signed)
Angel Tate is a 23 y.o. G1P0 at [redacted]w[redacted]d   Subjective: Pt with c/o pain and pressure.  Anesthesiologist at bedside to redose epidural.  Objective: BP 122/95  Pulse 112  Temp(Src) 98.2 F (36.8 C) (Oral)  Resp 18  Ht 5\' 4"  (1.626 m)  Wt 77.111 kg (170 lb)  BMI 29.17 kg/m2  SpO2 99%  LMP 04/16/2013      FHT:  140s,, reactive, occ moderate variables, moderate variability UC:   regular, every 1-2 minutes MVU 250  SVE:   Dilation: 6.5 Effacement (%): 90 Station: +1;+2 Exam by:: Dr Simona Huh  Labs: Lab Results  Component Value Date   WBC 19.5* 01/21/2014   HGB 12.3 01/21/2014   HCT 36.9 01/21/2014   MCV 91.1 01/21/2014   PLT 211 01/21/2014    Assessment / Plan: Augmentation of labor, progressing well  Labor: Minimal resting tone b/w contractions.  Decrease Pitocin. Preeclampsia:  no signs or symptoms of toxicity Fetal Wellbeing:  Category II Pain Control:  Epidural I/D:  n/a Anticipated MOD:  NSVD  Will check out to Dr. Nelda Marseille at 7 am.  Thurnell Lose 01/22/2014, 6:46 AM

## 2014-01-22 NOTE — H&P (Signed)
Angel Tate is a 23 y.o. female G1 at 57 0/7 weeks presenting for c/o contractions.  Pt started having contractions ~ 2 days ago.  Pt was evaluated for labor in office and sent to hospital for an Amnisure to r/o ROM.  Membranes were intact and AFI was normal.  Please see Dr. Annie Main H&P for more details. Pt presented to MAU twice in the last 24 hours.  Cervix remained 2 cms with gradual effacement.  Pt was observed several hours prior to discharge.  Pt received Stadol at time of discharge, ~ 6 hours before she was readmitted. At time of this admission, pt reported contractions were every 1-2 minutes, much stronger.  Denied LOF.  Pt has had bloody show previously.  Upon arrival cervix was 4 cm.  Pitocin was started for augmentation after no cervical change after 2 hours.  Pt is currently on 8 mUs and cervix is still 4 cm per RN who has assessed pt since admission. Pt is s/p epidural and currently comfortable. PNC has been uncomplicated.  Pt has a labial cyst that is not symptomatic.  She denies a h/o genital herpes.  Maternal Medical History:  Reason for admission: Contractions.   Contractions: Onset was 2 days ago.   Frequency: regular.   Perceived severity is strong.    Fetal activity: Perceived fetal activity is normal.    Prenatal complications: no prenatal complications Prenatal Complications - Diabetes: none.    OB History   Grav Para Term Preterm Abortions TAB SAB Ect Mult Living   1              Past Medical History  Diagnosis Date  . Medical history non-contributory    Past Surgical History  Procedure Laterality Date  . No past surgeries     Family History: family history includes Asthma in her maternal aunt; Hypertension in her maternal grandmother. Social History:  reports that she has never smoked. She has never used smokeless tobacco. She reports that she does not drink alcohol or use illicit drugs.   Prenatal Transfer Tool  Maternal Diabetes: No Genetic  Screening: Normal Maternal Ultrasounds/Referrals: Normal Fetal Ultrasounds or other Referrals:  None Maternal Substance Abuse:  No Significant Maternal Medications:  None Significant Maternal Lab Results:  Lab values include: Group B Strep negative Other Comments:  None  Review of Systems  Gastrointestinal: Positive for abdominal pain.   Cervx 4/90/0, cervix thicker on pt's right side IUPC placed posteriorly without difficulty.  Dilation: 4 Effacement (%): 90 Station: -1;0 Exam by:: e. poore, rn Blood pressure 105/62, pulse 92, temperature 98 F (36.7 C), temperature source Oral, resp. rate 18, height 5\' 4"  (1.626 m), weight 77.111 kg (170 lb), last menstrual period 04/16/2013, SpO2 99.00%. Maternal Exam:  Uterine Assessment: Contraction strength is moderate.  Contraction frequency is regular.   Abdomen: Patient reports no abdominal tenderness. Estimated fetal weight is 7 lbs.   Fetal presentation: vertex  Introitus: Vulva is positive for lesion. Normal vagina.  Ferning test: not done.  Amniotic fluid character: bloody. Labial cyst on 4 o'clock, nontender, not indurated, no discharge  Pelvis: adequate for delivery.   Cervix: Cervix evaluated by digital exam.     Fetal Exam Fetal Monitor Review: Mode: fetoscope.   Variability: moderate (6-25 bpm).   Pattern: accelerations present.    Fetal State Assessment: Category I - tracings are normal.     Physical Exam  Constitutional: She is oriented to person, place, and time. She appears well-developed and well-nourished. No  distress.  HENT:  Head: Normocephalic and atraumatic.  Eyes: EOM are normal.  Neck: Normal range of motion.  Respiratory: Effort normal. No respiratory distress.  Genitourinary: Vulva exhibits lesion.  Musculoskeletal: Normal range of motion. She exhibits no edema and no tenderness.  Neurological: She is alert and oriented to person, place, and time.  Skin: Skin is warm and dry.  Psychiatric: She has  a normal mood and affect.    Prenatal labs: ABO, Rh: --/--/O POS (09/23 1940) Antibody: NEG (09/23 1940) Rubella: Immune (02/02 0000) RPR: NON REAC (09/23 1940)  HBsAg: Negative (02/02 0000)  HIV: Non-reactive (02/02 0000)  GBS: Negative (08/18 0000)   Assessment/Plan: IUP at  40 1/7 weeks. Protracted labor. Category 1 tracing. Continue augmentation of labor with Pitocin. Determine adequacy of contractions with IUPC. Comfortable with epidural.  [name] 01/22/2014, 3:54 AM

## 2014-01-23 LAB — CBC
HCT: 31.2 % — ABNORMAL LOW (ref 36.0–46.0)
Hemoglobin: 10.3 g/dL — ABNORMAL LOW (ref 12.0–15.0)
MCH: 30.3 pg (ref 26.0–34.0)
MCHC: 33 g/dL (ref 30.0–36.0)
MCV: 91.8 fL (ref 78.0–100.0)
Platelets: 168 10*3/uL (ref 150–400)
RBC: 3.4 MIL/uL — ABNORMAL LOW (ref 3.87–5.11)
RDW: 14.2 % (ref 11.5–15.5)
WBC: 19.3 10*3/uL — ABNORMAL HIGH (ref 4.0–10.5)

## 2014-01-23 MED ORDER — IBUPROFEN 600 MG PO TABS: 600.0000 mg | ORAL_TABLET | Freq: Four times a day (QID) | ORAL | Status: DC

## 2014-01-23 MED ORDER — INFLUENZA VAC SPLIT QUAD 0.5 ML IM SUSY
0.5000 mL | PREFILLED_SYRINGE | INTRAMUSCULAR | Status: DC
  Filled 2014-01-23: qty 0.5

## 2014-01-23 MED ORDER — DOCUSATE SODIUM 100 MG PO CAPS: 100.0000 mg | ORAL_CAPSULE | Freq: Two times a day (BID) | ORAL | Status: DC

## 2014-01-23 NOTE — Lactation Note (Signed)
This note was copied from the chart of Angel [facility]. Lactation Consultation Note Baby hasn't latched well probabley d/t very short shaft. Mom has bouncy areolas. Hand expression taught w/good immediate colostrum flow. NS #20 fitted. Shells given to wear to evert nipples more, encouraged to wear w/bra between feedings. Hand pump given to pull nipples out prior to application of NS. Assisted baby in football position and latched well, needed stimulation to suck at times. Became fussy at times, rooting, then would suck. Explained part of newborn behavior. Mom encouraged to feed baby 8-12 times/24 hours and with feeding cues. Mom encouraged to waken baby for feeds. Mom taught how to apply & clean nipple shield. Mom encouraged to do skin-to-skin.Referred to Baby and Me Book in Breastfeeding section Pg. 22-23 for position options and Proper latch demonstration.[place] brochure given w/resources, support groups and Eva services.Encouraged to call for assistance if needed and to verify proper latch.[place] heard. Needs much encouragement and more teaching. Patient Name: Angel Tate Date: 01/23/2014 Reason for consult: Initial assessment   Maternal Data Has patient been taught Hand Expression?: Yes Does the patient have breastfeeding experience prior to this delivery?: No  Feeding Feeding Type: Breast Fed Length of feed: 10 min (still feeding)  LATCH Score/Interventions Latch: Repeated attempts needed to sustain latch, nipple held in mouth throughout feeding, stimulation needed to elicit sucking reflex. Intervention(s): Adjust position;Assist with latch;Breast massage;Breast compression  Audible Swallowing: Spontaneous and intermittent Intervention(s): Skin to skin;Hand expression  Type of Nipple: Everted at rest and after stimulation (short shaft)  Comfort (Breast/Nipple): Soft / non-tender     Hold (Positioning): Assistance needed to correctly position infant at  breast and maintain latch. Intervention(s): Breastfeeding basics reviewed;Support Pillows;Position options;Skin to skin  LATCH Score: 8  Lactation Tools Discussed/Used Tools: Shells;Nipple [place] Fuel;Pump Nipple shield size: 20 Shell Type: Inverted Breast pump type: Manual Pump Review: Setup, frequency, and cleaning;Milk Storage Initiated by:: Allayne Stack RN Date initiated:: 01/23/14   Consult Status Consult Status: Follow-up Date: 01/23/14 Follow-up type: In-patient    Theodoro Kalata 01/23/2014, 6:55 AM

## 2014-01-23 NOTE — Progress Notes (Signed)
Postpartum Note Day # 1  S:  Patient resting comfortable in bed.  Pain controlled.  Tolerating general. + flatus, no BM.  Lochia moderate.  Ambulating without difficulty.  She denies n/v/f/c, SOB, or CP.  Pt plans on breastfeeding.  O: Temp:  [97.8 F (36.6 C)-98.7 F (37.1 C)] 98.3 F (36.8 C) (09/25 0634) Pulse Rate:  [82-137] 103 (09/25 0634) Resp:  [17-20] 17 (09/25 0634) BP: (91-151)/(48-133) 117/66 mmHg (09/25 0634) SpO2:  [97 %] 97 % (09/25 0634) Gen: A&Ox3, NAD CV: RRR, no MRG Resp: CTAB Abdomen: soft, NT, ND Uterus: firm, non-tender, below umbilicus Ext: No edema, no calf tenderness bilaterally  Labs:  Recent Labs  01/21/14 1940 01/23/14 0630  HGB 12.3 10.3*    A/P: Pt is a 23 y.o. G1P1001 s/p NSVD PPD#1  - Pain well controlled -GU: UOP is adequate -GI: Tolerating general diet, encourage fiber and stool softener to help with BM -Activity: encouraged sitting up to chair and ambulation as tolerated -Prophylaxis: early ambulation -Labs: stable as above DISPO: Plan for discharge home tomorrow.  Janyth Pupa, DO [phone] (pager) [phone] (office)

## 2014-01-23 NOTE — Anesthesia Postprocedure Evaluation (Signed)
Anesthesia Post Note  Patient: Angel Tate  Procedure(s) Performed: * No procedures listed *  Anesthesia type: Epidural  Patient location: Mother/Baby  Post pain: Pain level controlled  Post assessment: Post-op Vital signs reviewed  Last Vitals:  Filed Vitals:   01/23/14 0634  BP: 117/66  Pulse: 103  Temp: 36.8 C  Resp: 17    Post vital signs: Reviewed  Level of consciousness:alert  Complications: No apparent anesthesia complications

## 2014-01-23 NOTE — Discharge Instructions (Signed)

## 2014-01-24 NOTE — Progress Notes (Signed)
Postpartum Note Day # 2  S:  Patient resting comfortable in bed.  Pain controlled.  Tolerating general. + flatus, + BM.  Lochia moderate and improving.  Ambulating without difficulty.  She denies n/v/f/c, SOB, or CP.  Pt breastfeeding without difficulty.  O: Temp:  [98.1 F (36.7 C)-98.4 F (36.9 C)] 98.1 F (36.7 C) (09/26 0610) Pulse Rate:  [70-84] 70 (09/26 0610) Resp:  [18-20] 18 (09/26 0610) BP: (115-118)/(58-74) 115/58 mmHg (09/26 0610) Gen: A&Ox3, NAD CV: RRR, no MRG Resp: CTAB Abdomen: soft, NT, ND Uterus: firm, non-tender, below umbilicus Ext: No edema, no calf tenderness bilaterally  Labs:   Recent Labs  01/21/14 1940 01/23/14 0630  HGB 12.3 10.3*    A/P: Pt is a 23 y.o. G1P1001 s/p NSVD PPD#2  - Pain well controlled -GU: UOP is adequate -GI: Tolerating general diet, encourage fiber and stool softener -Activity: encouraged sitting up to chair and ambulation as tolerated -Prophylaxis: early ambulation -Labs: stable as above DISPO: Plan for discharge today  Janyth Pupa, DO [phone] (pager) [phone] (office)

## 2014-01-26 ENCOUNTER — Inpatient Hospital Stay (HOSPITAL_COMMUNITY)
Admission: RE | Admit: 2014-01-26 | Discharge: 2014-01-26 | Disposition: A | Discharge status: Home / Self Care | Payer: Managed Care, Other (non HMO) | Source: Ambulatory Visit | Attending: Obstetrics & Gynecology | Admitting: Obstetrics & Gynecology

## 2014-01-29 NOTE — Discharge Summary (Signed)
Physician Discharge Summary  Patient ID: Angel Tate MRN: [id number] DOB/AGE: 1991-01-27 23 y.o.  Admit date: 01/21/2014 Discharge date: 01/24/2014  Admission Diagnoses: 1) Intrauterine pregnancy @ 40wk  2) Anemia in pregnancy 3) Low back pain  Discharge Diagnoses:  Principal Problem:   Vaginal delivery   Discharged Condition: stable  Hospital Course: Angel Tate is a 23 y.o. female G1 at 9 0/7 weeks presenting for c/o contractions. On admission, she was found to be 3cm dilated.   Pregnancy complicated by:  1) Low back pain- tylenol prn  2) Anemia in pregnancy- Hgb 10.7, integra daily, last check 8/27: Hgb 11.6   Pt started having contractions ~ 2 days ago, she has been to MAU x2 for early labor and upon arrival for this admission she was noted to be 4cm dilated with contractions q 1-54min. Pt received an epidural for pain management. She was augmented with Pitocin and an IUPC to monitor contractions. She progressed to complete dilation. At 4:36 PM a viable and healthy female was delivered by Dr. Nelda Marseille.  2nd degree perineal laceration was noted and repaired in the usual fashion.  APGAR: 8, 9; weight 8 lb 1.1 oz (3660 g).  Her postpartum course was uncomplicated and she was discharged home in stable condition, on PPD #2.  Consults: None  Treatments: IV hydration, analgesia: Epidural and procedures: IUPC placement, NSVD  Discharge Exam: Blood pressure 115/58, pulse 70, temperature 98.1 F (36.7 C), temperature source Oral, resp. rate 18, height 5\' 4"  (1.626 m), weight 77.111 kg (170 lb), last menstrual period 04/16/2013, SpO2 97.00%, unknown if currently breastfeeding. Gen: A&Ox3, NAD  CV: RRR, no MRG  Resp: CTAB  Abdomen: soft, NT, ND  Uterus: firm, non-tender, below umbilicus  Ext: No edema, no calf tenderness bilaterally  Disposition: 01-Home or Self Care     Medication List         acetaminophen 500 MG tablet  Commonly known as:  TYLENOL  Take 1,000 mg by  mouth once.     bisacodyl 5 MG EC tablet  Commonly known as:  DULCOLAX  Take 10 mg by mouth daily as needed for mild constipation.     calcium carbonate 500 MG chewable tablet  Commonly known as:  TUMS - dosed in mg elemental calcium  Chew 2 tablets by mouth daily as needed for indigestion or heartburn.     docusate sodium 100 MG capsule  Commonly known as:  COLACE  Take 1 capsule (100 mg total) by mouth 2 (two) times daily.     famotidine 20 MG tablet  Commonly known as:  PEPCID  Take 20 mg by mouth 2 (two) times daily as needed for heartburn or indigestion.     ibuprofen 600 MG tablet  Commonly known as:  ADVIL,MOTRIN  Take 1 tablet (600 mg total) by mouth every 6 (six) hours.     prenatal multivitamin Tabs tablet  Take 1 tablet by mouth daily at 12 noon.           Follow-up Information   Follow up with Janyth Pupa, M, DO In 6 weeks.   Specialty:  Obstetrics and Gynecology   Contact information:   [place] [id number] [phone]       Signed: Annalee Genta 01/29/2014, 9:45 AM

## 2014-02-09 NOTE — Progress Notes (Signed)
Post discharge chart review completed.  

## 2014-03-02 ENCOUNTER — Encounter (HOSPITAL_COMMUNITY): Payer: Self-pay | Admitting: *Deleted

## 2016-06-30 ENCOUNTER — Other Ambulatory Visit (HOSPITAL_COMMUNITY)
Admission: RE | Admit: 2016-06-30 | Discharge: 2016-06-30 | Disposition: A | Discharge status: Home / Self Care | Payer: BLUE CROSS/BLUE SHIELD | Source: Ambulatory Visit | Attending: Obstetrics and Gynecology | Admitting: Obstetrics and Gynecology

## 2016-06-30 ENCOUNTER — Other Ambulatory Visit: Payer: Self-pay | Admitting: Obstetrics & Gynecology

## 2016-06-30 DIAGNOSIS — Z01419 Encounter for gynecological examination (general) (routine) without abnormal findings: Secondary | ICD-10-CM | POA: Diagnosis not present

## 2016-07-04 LAB — CYTOLOGY - PAP: Diagnosis: NEGATIVE

## 2017-02-15 NOTE — H&P (Signed)
History and Physical   HPI. 26 y.o. G2P1001 @ [redacted]w[redacted]d with missed AB.  Ultrasound was performed on 10/15 that showed gestational sac measuring ~ [redacted]w[redacted]d with no cardiac activity.  She was initially treated with cytotec and did not some moderate bleeding.  Follow up hCG was completed and noted to be 41,000.  She tried an additional round of cytotec with no further bleeding.  She presents today for surgical intervention of missed AB.     [id number]- FTNSVD, uncomplicated female GYNHx: LMP: 11/27/16 last PAP: 06/2016, sexually active w/ one partner,  MedHx: none SxHx: none FHx: no ovarian CA, no colon CA, no breast CA, no Uterine CA SocHx: no tobacco, no EtOH, no drugs Meds: PNV ALL: NKDA  O:Examination in office Gen: NAD, A&Ox3 Heart/Lungs: S1S2 RRR, CTAB Abdomen: soft  no tenderness,  no rigidity,  no guarding,   Pelvis: No external genital lesions/rashes. Vaginal mucosa moist pink well rugated  w/out lesions,  no discharge,  cervix visualized closed with no lesions,  no adnexal tenderness no adnexal fullness, bilateral adnexa without palpable masses.  Extrem: no edema, no calf tenderness LE bilaterally, neg Homan's B/L  Labs: O pos, antibody neg, Hgb 12.7  10/17: hCG 41, 414  [place]: [redacted]wk gestational sac and fetal pole- no cardiac activity, bilateral ovaries unremarkable   A/P: 26 y.o. G2P1001 with missed AB who presents for surgical management with D&E -NPO -LR @ 125cc/hr -CBC, T&S to be collected -IV Doxycycline to OR -SCDs to OR -Risk/benefit and indications reviewed with patient including but not limited to risk of bleeding, infection and uterine injury reviewed with patient.  Questions and concerns were addressed and pt wishes to proceed.  Angel Pupa, DO [phone] (pager) [phone] (office)

## 2017-02-17 ENCOUNTER — Encounter (HOSPITAL_COMMUNITY)
Disposition: A | Discharge status: Home / Self Care | Payer: Self-pay | Source: Ambulatory Visit | Attending: Obstetrics & Gynecology

## 2017-02-17 ENCOUNTER — Ambulatory Visit (HOSPITAL_COMMUNITY): Payer: BLUE CROSS/BLUE SHIELD | Admitting: Anesthesiology

## 2017-02-17 ENCOUNTER — Ambulatory Visit (HOSPITAL_COMMUNITY)
Admit: 2017-02-17 | Discharge: 2017-02-17 | Disposition: A | Discharge status: Home / Self Care | Payer: BLUE CROSS/BLUE SHIELD | Source: Ambulatory Visit | Attending: Obstetrics & Gynecology | Admitting: Obstetrics & Gynecology

## 2017-02-17 ENCOUNTER — Encounter (HOSPITAL_COMMUNITY): Payer: Self-pay | Admitting: *Deleted

## 2017-02-17 DIAGNOSIS — O021 Missed abortion: Secondary | ICD-10-CM | POA: Diagnosis present

## 2017-02-17 HISTORY — DX: Anxiety disorder, unspecified: F41.9

## 2017-02-17 HISTORY — PX: DILATION AND EVACUATION: SHX1459

## 2017-02-17 LAB — CBC
HCT: 34.6 % — ABNORMAL LOW (ref 36.0–46.0)
Hemoglobin: 11.5 g/dL — ABNORMAL LOW (ref 12.0–15.0)
MCH: 29.9 pg (ref 26.0–34.0)
MCHC: 33.2 g/dL (ref 30.0–36.0)
MCV: 90.1 fL (ref 78.0–100.0)
Platelets: 242 10*3/uL (ref 150–400)
RBC: 3.84 MIL/uL — ABNORMAL LOW (ref 3.87–5.11)
RDW: 13.6 % (ref 11.5–15.5)
WBC: 8.3 10*3/uL (ref 4.0–10.5)

## 2017-02-17 LAB — TYPE AND SCREEN
ABO/RH(D): O POS
Antibody Screen: NEGATIVE

## 2017-02-17 SURGERY — DILATION AND EVACUATION, UTERUS
Anesthesia: Monitor Anesthesia Care | Site: Vagina

## 2017-02-17 MED ORDER — LIDOCAINE HCL (CARDIAC) 20 MG/ML IV SOLN
INTRAVENOUS | Status: DC | PRN
  Administered 2017-02-17: 80 mg via INTRAVENOUS

## 2017-02-17 MED ORDER — MIDAZOLAM HCL 2 MG/2ML IJ SOLN
INTRAMUSCULAR | Status: DC | PRN
  Administered 2017-02-17: 2 mg via INTRAVENOUS

## 2017-02-17 MED ORDER — DOXYCYCLINE HYCLATE 100 MG IV SOLR
100.0000 mg | Freq: Once | INTRAVENOUS | Status: AC
  Administered 2017-02-17: 100 mg via INTRAVENOUS
  Filled 2017-02-17: qty 100

## 2017-02-17 MED ORDER — FENTANYL CITRATE (PF) 100 MCG/2ML IJ SOLN
INTRAMUSCULAR | Status: DC | PRN
  Administered 2017-02-17: 100 ug via INTRAVENOUS

## 2017-02-17 MED ORDER — PROPOFOL 10 MG/ML IV BOLUS
INTRAVENOUS | Status: DC | PRN
  Administered 2017-02-17 (×3): 10 mg via INTRAVENOUS
  Administered 2017-02-17: 20 mg via INTRAVENOUS
  Administered 2017-02-17 (×3): 10 mg via INTRAVENOUS
  Administered 2017-02-17: 30 mg via INTRAVENOUS

## 2017-02-17 MED ORDER — DEXAMETHASONE SODIUM PHOSPHATE 10 MG/ML IJ SOLN
INTRAMUSCULAR | Status: DC | PRN
  Administered 2017-02-17: 10 mg via INTRAVENOUS

## 2017-02-17 MED ORDER — FENTANYL CITRATE (PF) 100 MCG/2ML IJ SOLN
INTRAMUSCULAR | Status: AC
  Filled 2017-02-17: qty 2

## 2017-02-17 MED ORDER — IBUPROFEN 200 MG PO TABS: 600.0000 mg | ORAL_TABLET | Freq: Four times a day (QID) | ORAL | 0 refills | Status: DC | PRN

## 2017-02-17 MED ORDER — ACETAMINOPHEN 500 MG PO TABS: 1000.0000 mg | ORAL_TABLET | ORAL | Status: DC

## 2017-02-17 MED ORDER — LIDOCAINE-EPINEPHRINE 1 %-1:100000 IJ SOLN
INTRAMUSCULAR | Status: DC | PRN
  Administered 2017-02-17: 20 mL

## 2017-02-17 MED ORDER — LACTATED RINGERS IV SOLN
INTRAVENOUS | Status: DC
  Administered 2017-02-17 (×2): via INTRAVENOUS
  Filled 2017-02-17: qty 1000

## 2017-02-17 MED ORDER — MIDAZOLAM HCL 2 MG/2ML IJ SOLN
INTRAMUSCULAR | Status: AC
  Filled 2017-02-17: qty 2

## 2017-02-17 MED ORDER — ONDANSETRON HCL 4 MG/2ML IJ SOLN
INTRAMUSCULAR | Status: DC | PRN
  Administered 2017-02-17: 4 mg via INTRAVENOUS

## 2017-02-17 MED ORDER — KETOROLAC TROMETHAMINE 30 MG/ML IJ SOLN
INTRAMUSCULAR | Status: DC | PRN
  Administered 2017-02-17: 30 mg via INTRAVENOUS

## 2017-02-17 MED ORDER — FENTANYL CITRATE (PF) 100 MCG/2ML IJ SOLN: 25.0000 ug | INTRAMUSCULAR | Status: DC | PRN

## 2017-02-17 SURGICAL SUPPLY — 19 items
CATH ROBINSON RED A/P 16FR (CATHETERS) ×2 IMPLANT
DECANTER SPIKE VIAL GLASS SM (MISCELLANEOUS) ×2 IMPLANT
GLOVE BIO SURGEON STRL SZ 6.5 (GLOVE) ×2 IMPLANT
GLOVE BIOGEL PI IND STRL 6.5 (GLOVE) ×1 IMPLANT
GLOVE BIOGEL PI IND STRL 7.0 (GLOVE) ×1 IMPLANT
GLOVE BIOGEL PI INDICATOR 6.5 (GLOVE) ×1
GLOVE BIOGEL PI INDICATOR 7.0 (GLOVE) ×1
GOWN STRL REUS W/TWL LRG LVL3 (GOWN DISPOSABLE) ×4 IMPLANT
KIT BERKELEY 1ST TRIMESTER 3/8 (MISCELLANEOUS) ×2 IMPLANT
NS IRRIG 1000ML POUR BTL (IV SOLUTION) ×2 IMPLANT
PACK VAGINAL MINOR WOMEN LF (CUSTOM PROCEDURE TRAY) ×2 IMPLANT
PAD OB MATERNITY 4.3X12.25 (PERSONAL CARE ITEMS) ×2 IMPLANT
PAD PREP 24X48 CUFFED NSTRL (MISCELLANEOUS) ×2 IMPLANT
SET BERKELEY SUCTION TUBING (SUCTIONS) ×2 IMPLANT
TOWEL OR 17X24 6PK STRL BLUE (TOWEL DISPOSABLE) ×4 IMPLANT
VACURETTE 10 RIGID CVD (CANNULA) ×2 IMPLANT
VACURETTE 7MM CVD STRL WRAP (CANNULA) IMPLANT
VACURETTE 8 RIGID CVD (CANNULA) IMPLANT
VACURETTE 9 RIGID CVD (CANNULA) IMPLANT

## 2017-02-17 NOTE — Anesthesia Preprocedure Evaluation (Signed)
Anesthesia Evaluation  Patient identified by MRN, date of birth, ID band Patient awake    Reviewed: Allergy & Precautions, H&P , Patient's Chart, lab work & pertinent test results, reviewed documented beta blocker date and time   Airway Mallampati: II  TM Distance: >3 FB Neck ROM: full    Dental no notable dental hx.    Pulmonary    Pulmonary exam normal breath sounds clear to auscultation       Cardiovascular  Rhythm:regular Rate:Normal     Neuro/Psych    GI/Hepatic   Endo/Other    Renal/GU      Musculoskeletal   Abdominal   Peds  Hematology   Anesthesia Other Findings   Reproductive/Obstetrics                             Anesthesia Physical Anesthesia Plan  ASA: II  Anesthesia Plan: MAC   Post-op Pain Management:    Induction: Intravenous  PONV Risk Score and Plan: 2 and Ondansetron, Dexamethasone and Treatment may vary due to age or medical condition  Airway Management Planned: Mask and Natural Airway  Additional Equipment:   Intra-op Plan:   Post-operative Plan:   Informed Consent: I have reviewed the patients History and Physical, chart, labs and discussed the procedure including the risks, benefits and alternatives for the proposed anesthesia with the patient or authorized representative who has indicated his/her understanding and acceptance.   Dental Advisory Given  Plan Discussed with: CRNA and Surgeon  Anesthesia Plan Comments: (MAC v LMA)        Anesthesia Quick Evaluation

## 2017-02-17 NOTE — Op Note (Signed)
Preoperative diagnosis: Missed AB  Postoperative diagnosis: Same  Anesthesia: IV sedation and paracervical block  Anesthesiologist: Dr. Glennon Mac  Procedure: Dilatation and evacuation  Surgeon: Dr. Janyth Pupa  Estimated blood loss: 20cc IVF: 1200cc UOP: 100cc  Procedure:  After being informed of the planned procedure with possible complications including bleeding, infection and injury to uterus, informed consent is obtained and patient is taken to or #4. She is placed in the lithotomy position and given IV sedation without complication. She is then prepped and draped in a sterile  fashion and her bladder is emptied with an in and out red rubber catheter.  Pelvic exam reveals a anteverted  uterus compatible with [redacted] weeks gestation. Both adnexa are felt and normal.  A weighted speculum is inserted in the vagina and the anterior lip of the cervix was grasped with a tenaculum forcep. We proceed with a paracervical block using 20 cc of Lidocaine1% with epi. The uterus was then sounded at 11 cm. The cervix was already dilated with products visible at os. Using a #10 curved cannula, we proceed with evacuation of products of conception without difficulty. We then proceed with sharp curettage of the uterine cavity to confirm complete evacuation.  Cannula was resinserted and evacuation was again performed.  Minimal blood noted.  All instruments were removed. Silver nitrazine was used at the tenaculum site.  Instrument and sponge count was correct.The procedure is very well tolerated by the patient is taken to recovery room in a well and stable condition.  Specimen: Products of conception sent to pathology  Janyth Pupa, DO [phone] (cell) [phone] (office)

## 2017-02-17 NOTE — MAU Note (Signed)
Pt presents for D&E for missed AB.

## 2017-02-17 NOTE — Discharge Instructions (Addendum)
HOME INSTRUCTIONS  Please note any unusual or excessive bleeding, pain, swelling. Mild dizziness or drowsiness are normal for about 24 hours after surgery.   Shower when comfortable  Restrictions: No driving for 24 hours or while taking pain medications.  Activity:  No heavy lifting (> 30 lbs), nothing in vagina (no tampons, douching, or intercourse) x 2 weeks; no tub baths for 2 weeks Vaginal spotting is expected but if your bleeding is heavy, period like,  please call the office   Diet:  You may return to your regular diet.  Do not eat large meals.  Eat small frequent meals throughout the day.  Continue to drink a good amount of water at least 6-8 glasses of water per day, hydration is very important for the healing process.  Pain Management: Take Ibuprofen (advil) 600mg  every 6 hours as needed or Tylenol as needed for pain.  Always take prescription pain medication with food.  Alcohol -- Avoid for 24 hours and while taking pain medications.  Nausea: Take sips of ginger ale or soda  Fever -- Call physician if temperature over 101 degrees  Follow up:  If you do not already have a follow up appointment scheduled, please call the office at [phone].  If you experience fever (a temperature greater than 100.4), pain unrelieved by pain medication, shortness of breath, swelling of a single leg, or any other symptoms which are concerning to you please the office immediately.

## 2017-02-17 NOTE — Progress Notes (Signed)
H&P reviewed, no changes.   Risks and benefits reviewed and questions were addressed.  Pt desires to proceed with Suction D&E.   Janyth Pupa, DO [phone] (cell) [phone] (office)

## 2017-02-17 NOTE — Anesthesia Postprocedure Evaluation (Signed)
Anesthesia Post Note  Patient: Angel Tate  Procedure(s) Performed: DILATATION AND EVACUATION (N/A Vagina )     Patient location during evaluation: PACU Anesthesia Type: MAC Level of consciousness: awake and alert Pain management: pain level controlled Vital Signs Assessment: post-procedure vital signs reviewed and stable Respiratory status: spontaneous breathing, nonlabored ventilation, respiratory function stable and patient connected to nasal cannula oxygen Cardiovascular status: stable and blood pressure returned to baseline Postop Assessment: no apparent nausea or vomiting Anesthetic complications: no    Last Vitals:  Vitals:   02/17/17 1230 02/17/17 1245  BP: (!) 106/57 104/60  Pulse: 89 82  Resp: (!) 24 18  Temp:  (!) 36.3 C  SpO2: 98% 99%    Last Pain:  Vitals:   02/17/17 1245  TempSrc:   PainSc: 0-No pain   Pain Goal:                 Riccardo Dubin

## 2017-02-17 NOTE — Transfer of Care (Signed)
Immediate Anesthesia Transfer of Care Note  Patient: Angel Tate  Procedure(s) Performed: DILATATION AND EVACUATION (N/A Vagina )  Patient Location: PACU  Anesthesia Type:MAC  Level of Consciousness: sedated  Airway & Oxygen Therapy: Patient Spontanous Breathing  Post-op Assessment: Report given to RN  Post vital signs: Reviewed and stable  Last Vitals:  Vitals:   02/17/17 0934  BP: 112/64  Pulse: 73  Resp: 20  Temp: 37.6 C  SpO2: 99%    Last Pain:  Vitals:   02/17/17 0934  TempSrc: Oral         Complications: No apparent anesthesia complications

## 2017-02-18 ENCOUNTER — Encounter (HOSPITAL_COMMUNITY): Payer: Self-pay | Admitting: Obstetrics & Gynecology

## 2017-02-19 NOTE — Addendum Note (Signed)
Addendum  created 02/19/17 1258 by Lucretia Kern D, CRNA   Charge Capture section accepted

## 2018-05-01 NOTE — L&D Delivery Note (Signed)
Delivery Note:   G3P1011 at [redacted]w[redacted]d  Admitting diagnosis: 38 wks water broke Risks: hx anxiety on Zoloft Onset of labor: 12 am 01/19/2019 Augmentation: none ROM: 12 am 01/19/2019  Complete dilation at 01/19/2019  0643 Onset of pushing at 0645 FHR second stage Category 2, moderate variability, occasional variable and leate decels.  Analgesia Lorriane Shire intrapartum:Epidural    Resumed care from [place], [place] for second stage at 0710' Pushing in R lateral position with CNM and L&D staff support, FOB present for birth and supportive.  Delivery of a Live born female  Birth Weight:  pending APGAR: 5, 8  Newborn Delivery   Birth date/time: 01/19/2019 07:19:00 Delivery type: Vaginal, Spontaneous      in cephalic presentation, position OA to LOT.   Nuchal Cord: Yes , double, baby somersaulted on perineum then cord unlooped, tactile stimulation to elicit cry, color improving slowly over couple of minutes, baby skin to skin with mother. Cord double clamped after cessation of pulsation, cut by FOB.  Collection of cord blood for typing completed. Cord blood donation-None  Arterial cord blood sample-No    Placenta partially separated with moderate bleeding noted, delivered manually from lower segment with gentle traction-Spontaneous  with 3 vessels . Complete and intact, central cord insertion. Uterotonics: Pitocin IV Placenta to L&D for disposal. Uterine tone firm, bleeding small  None  laceration identified.  Episiotomy:None  Local analgesia: none  Repair: NA Est. Blood Loss ([id number]   Complications: None  APGAR:1 min-5 , 5 min-8    Mom to postpartum.  Baby to Couplet care / Skin to Skin.  Delivery Report:  Review the Delivery Report for details.     Signed: Juliene Pina, CNM, MSN 01/19/2019, 7:43 AM

## 2018-06-18 LAB — OB RESULTS CONSOLE RPR: RPR: NONREACTIVE

## 2018-06-18 LAB — OB RESULTS CONSOLE RUBELLA ANTIBODY, IGM: Rubella: IMMUNE

## 2018-06-18 LAB — OB RESULTS CONSOLE HEPATITIS B SURFACE ANTIGEN: Hepatitis B Surface Ag: NEGATIVE

## 2018-06-18 LAB — OB RESULTS CONSOLE ABO/RH: RH Type: POSITIVE

## 2018-06-18 LAB — OB RESULTS CONSOLE ANTIBODY SCREEN: Antibody Screen: NEGATIVE

## 2018-06-18 LAB — OB RESULTS CONSOLE HIV ANTIBODY (ROUTINE TESTING): HIV: NONREACTIVE

## 2018-06-18 LAB — OB RESULTS CONSOLE GC/CHLAMYDIA
Chlamydia: NEGATIVE
Gonorrhea: NEGATIVE

## 2019-01-14 ENCOUNTER — Telehealth (HOSPITAL_COMMUNITY): Payer: Self-pay | Admitting: *Deleted

## 2019-01-14 ENCOUNTER — Encounter (HOSPITAL_COMMUNITY): Payer: Self-pay | Admitting: *Deleted

## 2019-01-14 LAB — OB RESULTS CONSOLE GBS: GBS: NEGATIVE

## 2019-01-14 NOTE — Telephone Encounter (Signed)
Preadmission screen  

## 2019-01-16 ENCOUNTER — Telehealth (HOSPITAL_COMMUNITY): Payer: Self-pay | Admitting: *Deleted

## 2019-01-16 ENCOUNTER — Encounter (HOSPITAL_COMMUNITY): Payer: Self-pay | Admitting: *Deleted

## 2019-01-16 NOTE — Telephone Encounter (Signed)
Preadmission screen  

## 2019-01-19 ENCOUNTER — Inpatient Hospital Stay (HOSPITAL_COMMUNITY)
Admission: AD | Admit: 2019-01-19 | Discharge: 2019-01-20 | DRG: 807 | Disposition: A | Discharge status: Home / Self Care | Payer: BC Managed Care – PPO | Attending: Obstetrics & Gynecology | Admitting: Obstetrics & Gynecology

## 2019-01-19 ENCOUNTER — Inpatient Hospital Stay (HOSPITAL_COMMUNITY): Payer: BC Managed Care – PPO | Admitting: Anesthesiology

## 2019-01-19 ENCOUNTER — Encounter (HOSPITAL_COMMUNITY): Payer: Self-pay

## 2019-01-19 ENCOUNTER — Other Ambulatory Visit: Payer: Self-pay

## 2019-01-19 DIAGNOSIS — O43123 Velamentous insertion of umbilical cord, third trimester: Secondary | ICD-10-CM | POA: Diagnosis present

## 2019-01-19 DIAGNOSIS — O99344 Other mental disorders complicating childbirth: Secondary | ICD-10-CM | POA: Diagnosis present

## 2019-01-19 DIAGNOSIS — F419 Anxiety disorder, unspecified: Secondary | ICD-10-CM | POA: Diagnosis present

## 2019-01-19 DIAGNOSIS — Z3A37 37 weeks gestation of pregnancy: Secondary | ICD-10-CM | POA: Diagnosis not present

## 2019-01-19 DIAGNOSIS — Z20828 Contact with and (suspected) exposure to other viral communicable diseases: Secondary | ICD-10-CM | POA: Diagnosis present

## 2019-01-19 DIAGNOSIS — F32A Depression, unspecified: Secondary | ICD-10-CM | POA: Diagnosis present

## 2019-01-19 DIAGNOSIS — F329 Major depressive disorder, single episode, unspecified: Secondary | ICD-10-CM | POA: Diagnosis present

## 2019-01-19 DIAGNOSIS — O26893 Other specified pregnancy related conditions, third trimester: Secondary | ICD-10-CM | POA: Diagnosis present

## 2019-01-19 HISTORY — DX: Depression, unspecified: F32.A

## 2019-01-19 LAB — CBC
HCT: 36 % (ref 36.0–46.0)
Hemoglobin: 11.9 g/dL — ABNORMAL LOW (ref 12.0–15.0)
MCH: 31.7 pg (ref 26.0–34.0)
MCHC: 33.1 g/dL (ref 30.0–36.0)
MCV: 96 fL (ref 80.0–100.0)
Platelets: 189 10*3/uL (ref 150–400)
RBC: 3.75 MIL/uL — ABNORMAL LOW (ref 3.87–5.11)
RDW: 13.7 % (ref 11.5–15.5)
WBC: 12.9 10*3/uL — ABNORMAL HIGH (ref 4.0–10.5)
nRBC: 0 % (ref 0.0–0.2)

## 2019-01-19 LAB — POCT FERN TEST: POCT Fern Test: POSITIVE

## 2019-01-19 LAB — TYPE AND SCREEN
ABO/RH(D): O POS
Antibody Screen: NEGATIVE

## 2019-01-19 LAB — ABO/RH: ABO/RH(D): O POS

## 2019-01-19 LAB — RPR: RPR Ser Ql: NONREACTIVE

## 2019-01-19 LAB — SARS CORONAVIRUS 2 BY RT PCR (HOSPITAL ORDER, PERFORMED IN ~~LOC~~ HOSPITAL LAB): SARS Coronavirus 2: NEGATIVE

## 2019-01-19 MED ORDER — FENTANYL CITRATE (PF) 100 MCG/2ML IJ SOLN
INTRAMUSCULAR | Status: AC
  Filled 2019-01-19: qty 2

## 2019-01-19 MED ORDER — SOD CITRATE-CITRIC ACID 500-334 MG/5ML PO SOLN: 30.0000 mL | ORAL | Status: DC | PRN

## 2019-01-19 MED ORDER — WITCH HAZEL-GLYCERIN EX PADS: 1.0000 "application " | MEDICATED_PAD | CUTANEOUS | Status: DC | PRN

## 2019-01-19 MED ORDER — TETANUS-DIPHTH-ACELL PERTUSSIS 5-2.5-18.5 LF-MCG/0.5 IM SUSP: 0.5000 mL | Freq: Once | INTRAMUSCULAR | Status: DC

## 2019-01-19 MED ORDER — METHYLERGONOVINE MALEATE 0.2 MG/ML IJ SOLN: 0.2000 mg | INTRAMUSCULAR | Status: DC | PRN

## 2019-01-19 MED ORDER — BENZOCAINE-MENTHOL 20-0.5 % EX AERO: 1.0000 "application " | INHALATION_SPRAY | CUTANEOUS | Status: DC | PRN

## 2019-01-19 MED ORDER — PHENYLEPHRINE 40 MCG/ML (10ML) SYRINGE FOR IV PUSH (FOR BLOOD PRESSURE SUPPORT): 80.0000 ug | PREFILLED_SYRINGE | INTRAVENOUS | Status: DC | PRN

## 2019-01-19 MED ORDER — FENTANYL-BUPIVACAINE-NACL 0.5-0.125-0.9 MG/250ML-% EP SOLN
12.0000 mL/h | EPIDURAL | Status: DC | PRN
  Filled 2019-01-19: qty 250

## 2019-01-19 MED ORDER — DIPHENHYDRAMINE HCL 50 MG/ML IJ SOLN: 12.5000 mg | INTRAMUSCULAR | Status: DC | PRN

## 2019-01-19 MED ORDER — LACTATED RINGERS IV SOLN: 500.0000 mL | INTRAVENOUS | Status: DC | PRN

## 2019-01-19 MED ORDER — OXYCODONE-ACETAMINOPHEN 5-325 MG PO TABS: 2.0000 | ORAL_TABLET | ORAL | Status: DC | PRN

## 2019-01-19 MED ORDER — LACTATED RINGERS IV SOLN
500.0000 mL | Freq: Once | INTRAVENOUS | Status: AC
  Administered 2019-01-19: 500 mL via INTRAVENOUS

## 2019-01-19 MED ORDER — SIMETHICONE 80 MG PO CHEW: 80.0000 mg | CHEWABLE_TABLET | ORAL | Status: DC | PRN

## 2019-01-19 MED ORDER — OXYTOCIN 40 UNITS IN NORMAL SALINE INFUSION - SIMPLE MED
2.5000 [IU]/h | INTRAVENOUS | Status: DC
  Filled 2019-01-19: qty 1000

## 2019-01-19 MED ORDER — SODIUM CHLORIDE (PF) 0.9 % IJ SOLN
INTRAMUSCULAR | Status: DC | PRN
  Administered 2019-01-19: 12 mL/h via EPIDURAL

## 2019-01-19 MED ORDER — ONDANSETRON HCL 4 MG/2ML IJ SOLN: 4.0000 mg | Freq: Four times a day (QID) | INTRAMUSCULAR | Status: DC | PRN

## 2019-01-19 MED ORDER — BUPIVACAINE HCL (PF) 0.25 % IJ SOLN
INTRAMUSCULAR | Status: DC | PRN
  Administered 2019-01-19 (×2): 5 mL via EPIDURAL

## 2019-01-19 MED ORDER — DIBUCAINE (PERIANAL) 1 % EX OINT: 1.0000 "application " | TOPICAL_OINTMENT | CUTANEOUS | Status: DC | PRN

## 2019-01-19 MED ORDER — ONDANSETRON HCL 4 MG/2ML IJ SOLN: 4.0000 mg | INTRAMUSCULAR | Status: DC | PRN

## 2019-01-19 MED ORDER — EPHEDRINE 5 MG/ML INJ: 10.0000 mg | INTRAVENOUS | Status: DC | PRN

## 2019-01-19 MED ORDER — ACETAMINOPHEN 325 MG PO TABS: 650.0000 mg | ORAL_TABLET | ORAL | Status: DC | PRN

## 2019-01-19 MED ORDER — ONDANSETRON HCL 4 MG PO TABS: 4.0000 mg | ORAL_TABLET | ORAL | Status: DC | PRN

## 2019-01-19 MED ORDER — LIDOCAINE HCL (PF) 1 % IJ SOLN
INTRAMUSCULAR | Status: DC | PRN
  Administered 2019-01-19 (×2): 5 mL via EPIDURAL

## 2019-01-19 MED ORDER — METHYLERGONOVINE MALEATE 0.2 MG PO TABS: 0.2000 mg | ORAL_TABLET | ORAL | Status: DC | PRN

## 2019-01-19 MED ORDER — OXYTOCIN BOLUS FROM INFUSION
500.0000 mL | Freq: Once | INTRAVENOUS | Status: AC
  Administered 2019-01-19: 500 mL via INTRAVENOUS

## 2019-01-19 MED ORDER — FLEET ENEMA 7-19 GM/118ML RE ENEM: 1.0000 | ENEMA | Freq: Every day | RECTAL | Status: DC | PRN

## 2019-01-19 MED ORDER — IBUPROFEN 600 MG PO TABS
600.0000 mg | ORAL_TABLET | Freq: Four times a day (QID) | ORAL | Status: DC
  Administered 2019-01-19 – 2019-01-20 (×5): 600 mg via ORAL
  Filled 2019-01-19 (×5): qty 1

## 2019-01-19 MED ORDER — SENNOSIDES-DOCUSATE SODIUM 8.6-50 MG PO TABS
2.0000 | ORAL_TABLET | ORAL | Status: DC
  Administered 2019-01-20: 2 via ORAL
  Filled 2019-01-19: qty 2

## 2019-01-19 MED ORDER — PHENYLEPHRINE 40 MCG/ML (10ML) SYRINGE FOR IV PUSH (FOR BLOOD PRESSURE SUPPORT)
80.0000 ug | PREFILLED_SYRINGE | INTRAVENOUS | Status: DC | PRN
  Filled 2019-01-19: qty 10

## 2019-01-19 MED ORDER — FLEET ENEMA 7-19 GM/118ML RE ENEM: 1.0000 | ENEMA | RECTAL | Status: DC | PRN

## 2019-01-19 MED ORDER — OXYCODONE-ACETAMINOPHEN 5-325 MG PO TABS: 1.0000 | ORAL_TABLET | ORAL | Status: DC | PRN

## 2019-01-19 MED ORDER — DIPHENHYDRAMINE HCL 25 MG PO CAPS: 25.0000 mg | ORAL_CAPSULE | Freq: Four times a day (QID) | ORAL | Status: DC | PRN

## 2019-01-19 MED ORDER — PRENATAL MULTIVITAMIN CH
1.0000 | ORAL_TABLET | Freq: Every day | ORAL | Status: DC
  Administered 2019-01-19 – 2019-01-20 (×2): 1 via ORAL
  Filled 2019-01-19 (×2): qty 1

## 2019-01-19 MED ORDER — FENTANYL CITRATE (PF) 100 MCG/2ML IJ SOLN
INTRAMUSCULAR | Status: DC | PRN
  Administered 2019-01-19: 100 ug via EPIDURAL

## 2019-01-19 MED ORDER — COCONUT OIL OIL: 1.0000 "application " | TOPICAL_OIL | Status: DC | PRN

## 2019-01-19 MED ORDER — BISACODYL 10 MG RE SUPP: 10.0000 mg | Freq: Every day | RECTAL | Status: DC | PRN

## 2019-01-19 MED ORDER — ZOLPIDEM TARTRATE 5 MG PO TABS: 5.0000 mg | ORAL_TABLET | Freq: Every evening | ORAL | Status: DC | PRN

## 2019-01-19 MED ORDER — LACTATED RINGERS IV SOLN: INTRAVENOUS | Status: DC

## 2019-01-19 MED ORDER — LIDOCAINE HCL (PF) 1 % IJ SOLN: 30.0000 mL | INTRAMUSCULAR | Status: DC | PRN

## 2019-01-19 MED ORDER — SERTRALINE HCL 50 MG PO TABS
50.0000 mg | ORAL_TABLET | Freq: Every day | ORAL | Status: DC
  Administered 2019-01-19: 50 mg via ORAL
  Filled 2019-01-19: qty 1

## 2019-01-19 NOTE — Plan of Care (Signed)
  Problem: Clinical Measurements: Goal: Ability to maintain clinical measurements within normal limits will improve Outcome: Progressing Goal: Will remain free from infection Outcome: Progressing Goal: Diagnostic test results will improve Outcome: Progressing Goal: Respiratory complications will improve Outcome: Progressing Goal: Cardiovascular complication will be avoided Outcome: Progressing   Problem: Activity: Goal: Risk for activity intolerance will decrease Outcome: Progressing   Problem: Nutrition: Goal: Adequate nutrition will be maintained Outcome: Progressing   Problem: Elimination: Goal: Will not experience complications related to bowel motility Outcome: Progressing Goal: Will not experience complications related to urinary retention Outcome: Progressing   Problem: Skin Integrity: Goal: Risk for impaired skin integrity will decrease Outcome: Progressing   

## 2019-01-19 NOTE — MAU Note (Addendum)
Presents with LOF and cxts starting around 0000 today. Unable to time cxts. Pos FM   Gilmer Mor RN

## 2019-01-19 NOTE — Lactation Note (Signed)
This note was copied from a baby's chart. Lactation Consultation Note  Patient Name: Angel Tate [id number] Date: 01/19/2019 Reason for consult: Initial assessment;Early term 37-38.6wks P2.  Mom states first baby never latched so she pumped and bottle fed x 1 year. Newborn is 4 hours old and latched to breast with ease.  Baby is currently skin to skin on mom's chest.  Instructed to watch for feeding cues and call for assist prn.  Breastfeeding consultation services and support given and reviewed.  Maternal Data Formula Feeding for Exclusion: No Does the patient have breastfeeding experience prior to this delivery?: Yes  Feeding Feeding Type: Breast Fed  LATCH Score Latch: Repeated attempts needed to sustain latch, nipple held in mouth throughout feeding, stimulation needed to elicit sucking reflex.  Audible Swallowing: Spontaneous and intermittent  Type of Nipple: Everted at rest and after stimulation  Comfort (Breast/Nipple): Soft / non-tender  Hold (Positioning): Assistance needed to correctly position infant at breast and maintain latch.  LATCH Score: 8  Interventions    Lactation Tools Discussed/Used     Consult Status Consult Status: Follow-up Date: 01/20/19 Follow-up type: In-patient    Ave Filter 01/19/2019, 11:46 AM

## 2019-01-19 NOTE — Anesthesia Procedure Notes (Signed)
Epidural Patient location during procedure: OB Start time: 01/19/2019 3:35 AM End time: 01/19/2019 3:45 AM  Staffing Anesthesiologist: Murvin Natal, MD Performed: anesthesiologist   Preanesthetic Checklist Completed: patient identified, site marked, pre-op evaluation, timeout performed, IV checked, risks and benefits discussed and monitors and equipment checked  Epidural Patient position: sitting Prep: DuraPrep Patient monitoring: heart rate, cardiac monitor, continuous pulse ox and blood pressure Approach: midline Location: L4-L5 Injection technique: LOR air  Needle:  Needle type: Tuohy  Needle gauge: 17 G Needle length: 9 cm Needle insertion depth: 5 cm Catheter type: closed end flexible Catheter size: 19 Gauge Catheter at skin depth: 10 cm Test dose: negative and Other  Assessment Events: blood not aspirated, injection not painful, no injection resistance and negative IV test  Additional Notes Informed consent obtained prior to proceeding including risk of failure, 1% risk of PDPH, risk of minor discomfort and bruising. Discussed alternatives to epidural analgesia and patient desires to proceed.  Timeout performed pre-procedure verifying patient name, procedure, and platelet count.  Patient tolerated procedure well. Reason for block:procedure for pain

## 2019-01-19 NOTE — H&P (Signed)
Angel Tate is a 28 y.o. female, G3P1011 at 37.6 weeks, presenting for SROM clear fluid and labor.  Past hx of postpartum depression and anxiety.  Currently on Zoloft 50 mg.  Patient obtained prenatal [facility] with [facility]  Patient Active Problem List   Diagnosis Date Noted  . Indication for care in labor or delivery 01/19/2019  . Vaginal delivery 01/23/2014    History of present pregnancy: Patient entered care at 10.3 weeks.   EDC of 02/03/2019 was established by LMP.   Anatomy scan: 20 weeks, with normal findings and an posterior placenta.   Additional [place] evaluations:  None Significant prenatal events:  None Last evaluation: This week  OB History    Gravida  3   Para  1   Term  1   Preterm      AB  1   Living  1     SAB  1   TAB      Ectopic      Multiple      Live Births  1          Past Medical History:  Diagnosis Date  . Anxiety   . Depression   . Medical history non-contributory    Past Surgical History:  Procedure Laterality Date  . DILATION AND CURETTAGE OF UTERUS    . DILATION AND EVACUATION N/A 02/17/2017   Procedure: DILATATION AND EVACUATION;  Surgeon: Janyth Pupa, DO;  Location: [facility];  Service: Gynecology;  Laterality: N/A;   Family History: family history includes Asthma in her maternal aunt; Hypertension in her maternal grandmother. Social History:  reports that she has never smoked. She has never used smokeless tobacco. She reports current alcohol use. She reports that she does not use drugs.   Prenatal Transfer Tool  Maternal Diabetes: No Genetic Screening: Normal Maternal Ultrasounds/Referrals: Normal Fetal Ultrasounds or other Referrals:  None Maternal Substance Abuse:  No Significant Maternal Medications:  None Significant Maternal Lab Results: None  TDAP Yes Flu UTD  ROS:  All 10 systems reviewed and negative  No Known Allergies   Dilation: 6 Effacement (%): 80 Station: -1 Exam by:: Angel Tate,  CNM Blood pressure 117/64, pulse (!) 137, temperature 98.1 F (36.7 C), temperature source Oral, resp. rate 16, height 5\' 4"  (1.626 m), weight 77.8 kg, last menstrual period 04/29/2018, SpO2 100 %, unknown if currently breastfeeding.  Chest clear Heart RRR without murmur Abd gravid, NT, FH apprporiate Pelvic: adequate Ext: +2/+2 neg  FHR: Category 1 UCs: every 2  Prenatal labs: ABO, Rh: O positive Antibody: Neg Rubella:  Immune (02/18 0000) RPR: Nonreactive (02/18 0000)  HBsAg: Negative (02/18 0000)  HIV: Non-reactive (02/18 0000)  GBS: Negative/-- (09/15 0000) Sickle cell/Hgb electrophoresis:  N/A  GC:  Neg Chlamydia: Neg Genetic screenings: Neg Glucola: 110 Other:   Hgb 12.9 at NOB, 11.1 at 28 weeks       Assessment/Plan: IUP at 37.6 in active labor srom clear fluid Cat 1 strip  Plan: Admit to [place] Pain med/epidural prn Anticipate SVD  Angel Koch ProtheroCNM, MSN 01/19/2019, 3:13 AM

## 2019-01-19 NOTE — Anesthesia Postprocedure Evaluation (Signed)
Anesthesia Post Note  Patient: Angel Tate  Procedure(s) Performed: AN AD HOC LABOR EPIDURAL     Patient location during evaluation: Mother Baby Anesthesia Type: Epidural Level of consciousness: awake and alert, oriented and patient cooperative Pain management: pain level controlled Vital Signs Assessment: post-procedure vital signs reviewed and stable Respiratory status: spontaneous breathing Cardiovascular status: stable Postop Assessment: no headache, epidural receding, patient able to bend at knees and no signs of nausea or vomiting Anesthetic complications: no Comments: Pain score 3.     Last Vitals:  Vitals:   01/19/19 1010 01/19/19 1500  BP: 110/67 111/67  Pulse: 89 76  Resp: 16 16  Temp: 36.7 C 36.9 C  SpO2: 98% 98%    Last Pain:  Vitals:   01/19/19 1509  TempSrc:   PainSc: 3    Pain Goal:                   [facility]

## 2019-01-19 NOTE — Anesthesia Preprocedure Evaluation (Signed)
Anesthesia Evaluation  Patient identified by MRN, date of birth, ID band Patient awake    Reviewed: Allergy & Precautions, H&P , NPO status , Patient's Chart, lab work & pertinent test results  History of Anesthesia Complications Negative for: history of anesthetic complications  Airway Mallampati: II  TM Distance: >3 FB Neck ROM: full    Dental no notable dental hx. (+) Teeth Intact   Pulmonary neg pulmonary ROS,    Pulmonary exam normal breath sounds clear to auscultation       Cardiovascular negative cardio ROS Normal cardiovascular exam Rhythm:regular Rate:Normal     Neuro/Psych PSYCHIATRIC DISORDERS Anxiety Depression negative neurological ROS     GI/Hepatic negative GI ROS, Neg liver ROS,   Endo/Other  negative endocrine ROS  Renal/GU negative Renal ROS  negative genitourinary   Musculoskeletal   Abdominal   Peds  Hematology negative hematology ROS (+)   Anesthesia Other Findings   Reproductive/Obstetrics (+) Pregnancy                             Anesthesia Physical Anesthesia Plan  ASA: II  Anesthesia Plan: Epidural   Post-op Pain Management:    Induction:   PONV Risk Score and Plan:   Airway Management Planned:   Additional Equipment:   Intra-op Plan:   Post-operative Plan:   Informed Consent: I have reviewed the patients History and Physical, chart, labs and discussed the procedure including the risks, benefits and alternatives for the proposed anesthesia with the patient or authorized representative who has indicated his/her understanding and acceptance.       Plan Discussed with:   Anesthesia Plan Comments:         Anesthesia Quick Evaluation

## 2019-01-20 LAB — CBC
HCT: 33.5 % — ABNORMAL LOW (ref 36.0–46.0)
Hemoglobin: 10.9 g/dL — ABNORMAL LOW (ref 12.0–15.0)
MCH: 31.5 pg (ref 26.0–34.0)
MCHC: 32.5 g/dL (ref 30.0–36.0)
MCV: 96.8 fL (ref 80.0–100.0)
Platelets: 173 10*3/uL (ref 150–400)
RBC: 3.46 MIL/uL — ABNORMAL LOW (ref 3.87–5.11)
RDW: 14.3 % (ref 11.5–15.5)
WBC: 13.4 10*3/uL — ABNORMAL HIGH (ref 4.0–10.5)
nRBC: 0 % (ref 0.0–0.2)

## 2019-01-20 NOTE — Lactation Note (Signed)
This note was copied from a baby's chart. Lactation Consultation Note  Patient Name: Angel Tate [id number] Date: 01/20/2019 Reason for consult: Follow-up assessment;Early term 59-38.6wks Baby is 26 hours old/3% weight loss.  Mom currently has baby on breast in football hold.  Baby is latched well and good active suck/swallow observed.  Mom is comfortable.  She is also pumping small amounts and spoon feeding baby.  Discussed milk coming to volume and the prevention and treatment of engorgement.  Mom has a breast pump at home.  No questions or concerns.  Reviewed lactation services and encouraged to call prn.  Maternal Data    Feeding Feeding Type: Breast Fed  LATCH Score Latch: Grasps breast easily, tongue down, lips flanged, rhythmical sucking.  Audible Swallowing: Spontaneous and intermittent  Type of Nipple: Everted at rest and after stimulation  Comfort (Breast/Nipple): Soft / non-tender  Hold (Positioning): Assistance needed to correctly position infant at breast and maintain latch.  LATCH Score: 9  Interventions    Lactation Tools Discussed/Used     Consult Status Consult Status: Complete Follow-up type: Call as needed    Ave Filter 01/20/2019, 9:28 AM

## 2019-01-20 NOTE — Progress Notes (Addendum)
Patient ID: Angel Tate, female   DOB: [birth year], 28 y.o.   MRN: [id number] PPD # 1 S/P NSVD  Live born female  Birth Weight: 7 lb 6.3 oz (3355 g) APGAR: 5, 8  Newborn Delivery   Birth date/time: 01/19/2019 07:19:00 Delivery type: Vaginal, Spontaneous     Baby name: Eulas Post Delivering provider: Derrell Lolling C  Episiotomy:None   Lacerations:None   circumcision declines  Feeding: breast  Pain control at delivery: Epidural   S:  Reports feeling very well             Tolerating po/ No nausea or vomiting             Bleeding is decreased             Pain controlled with ibuprofen (OTC)             Up ad lib / ambulatory / voiding without difficulties   O:  A & O x 3, in no apparent distress              VS:  Vitals:   01/19/19 1500 01/19/19 1940 01/19/19 2329 01/20/19 0535  BP: 111/67 110/75 121/71 113/76  Pulse: 76 79 77 87  Resp: 16 18 18 18   Temp: 98.4 F (36.9 C) 98.8 F (37.1 C) 97.9 F (36.6 C) 98.6 F (37 C)  TempSrc: Oral Oral Oral Oral  SpO2: 98% 98% 99% 98%  Weight:      Height:        LABS:  Recent Labs    01/19/19 0231 01/20/19 0526  WBC 12.9* 13.4*  HGB 11.9* 10.9*  HCT 36.0 33.5*  PLT 189 173    Blood type: --/--/O POS, O POS Performed at [facility], [address] [place], [place] [address]  [phone])  Rubella: Immune (02/18 0000)   I&O: I/O last 3 completed shifts: In: -  Out: 700 [Urine:400; Blood:300]          No intake/output data recorded.  Vaccines: TDaP UTD         Flu    UTD   Lungs: Clear and unlabored  Heart: regular rate and rhythm / no murmurs  Abdomen: soft, non-tender, non-distended, (+) abdominal wall laxity             Fundus: firm, non-tender, U-1  Perineum: intact, no edema  Lochia: small  Extremities: no edema, no calf pain or tenderness    A/P: PPD # 1 27 y.o., [id number]   Principal Problem:   Postpartum care following vaginal delivery 9/20 Active Problems:   Vaginal delivery   Indication for  care in labor or delivery   Anxiety and depression  - stable on Zoloft 50 mg daily, to continue  - reviewed warning signs of PPD, when to call, self-care   Doing well - stable status  Routine post partum orders  -abdominal binder for comfort x 2 weeks while ambulating             DC home today w/ instructions  F/U at [place] office in 6 weeks and PRN     Juliene Pina, MSN, CNM 01/20/2019, 6:53 AM

## 2019-01-20 NOTE — Discharge Summary (Signed)
OB Discharge Summary     Patient Name: Angel Tate DOB: Jul 02, 1990 MRN: [id number]  Date of admission: 01/19/2019 Delivering MD: Derrell Lolling C   Date of discharge: 01/20/2019  Admitting diagnosis: 84 wks water broke Intrauterine pregnancy: [redacted]w[redacted]d     Secondary diagnosis:  Principal Problem:   Postpartum care following vaginal delivery 9/20 Active Problems:   Vaginal delivery   Indication for care in labor or delivery   Anxiety and depression  Additional problems: none     Discharge diagnosis: Term Pregnancy Delivered                                                                                                Post partum procedures:none  Augmentation: none  Complications: None  Hospital course:  Onset of Labor With Vaginal Delivery     28 y.o. yo [id number] at [redacted]w[redacted]d was admitted in Active Labor on 01/19/2019. Patient had an uncomplicated labor course as follows:  Membrane Rupture Time/Date: 12:00 AM ,01/19/2019   Intrapartum Procedures: Episiotomy: None [1]                                         Lacerations:  None [1]  Patient had a delivery of a Viable infant. 01/19/2019  Information for the patient's newborn:  [place], [place] [id number]  Delivery Method: Vaginal, Spontaneous(Filed from Delivery Summary)     Pateint had an uncomplicated postpartum course.  She is ambulating, tolerating a regular diet, passing flatus, and urinating well. Patient is discharged home in stable condition on 01/20/19.   Physical exam  Vitals:   01/19/19 1500 01/19/19 1940 01/19/19 2329 01/20/19 0535  BP: 111/67 110/75 121/71 113/76  Pulse: 76 79 77 87  Resp: 16 18 18 18   Temp: 98.4 F (36.9 C) 98.8 F (37.1 C) 97.9 F (36.6 C) 98.6 F (37 C)  TempSrc: Oral Oral Oral Oral  SpO2: 98% 98% 99% 98%  Weight:      Height:       General: alert, cooperative and no distress  CV: RRR Lungs: CTAB Lochia: appropriate Uterine Fundus: firm Incision: N/A DVT Evaluation: No  evidence of DVT seen on physical exam. Labs: Lab Results  Component Value Date   WBC 13.4 (H) 01/20/2019   HGB 10.9 (L) 01/20/2019   HCT 33.5 (L) 01/20/2019   MCV 96.8 01/20/2019   PLT 173 01/20/2019   No flowsheet data found.  Discharge instruction: per After Visit Summary and "Baby and Me Booklet".  After visit meds:  Allergies as of 01/20/2019   No Known Allergies     Medication List    TAKE these medications   ibuprofen 200 MG tablet Commonly known as: ADVIL Take 3 tablets (600 mg total) by mouth every 6 (six) hours as needed (for pain.).   prenatal multivitamin Tabs tablet Take 1 tablet by mouth at bedtime.   sertraline 100 MG tablet Commonly known as: ZOLOFT Take 100 mg by mouth at bedtime.       Diet:  routine diet  Activity: Advance as tolerated. Pelvic rest for 6 weeks.   Outpatient follow up:6 weeks Follow up Appt: Future Appointments  Date Time Provider [place]  01/27/2019  8:40 AM [facility]-MAU 1 [facility]INDC None  01/29/2019  7:15 AM [facility]-LD SCHED ROOM [facility] None   Follow up Visit:No follow-ups on file.  Postpartum contraception: Progesterone only pills and Not Discussed  Newborn Data: Live born female  Birth Weight: 7 lb 6.3 oz (3355 g) APGAR: 5, 8  Newborn Delivery   Birth date/time: 01/19/2019 07:19:00 Delivery type: Vaginal, Spontaneous      Baby Feeding: Breast Disposition:home with mother   01/20/2019 Annalee Genta, DO

## 2019-01-20 NOTE — Progress Notes (Signed)
MOB was referred for history of depression/anxiety. * Referral screened out by Clinical Social Worker because none of the following criteria appear to apply: ~ History of anxiety/depression during this pregnancy, or of post-partum depression following prior delivery. ~ Diagnosis of anxiety and/or depression within last 3 years OR * MOB's symptoms currently being treated with medication and/or therapy. Per MOB's chart review, MOB on Zoloft for anxiety and depression.     Please contact the Clinical Social Worker if needs arise, by [facility] request, or if MOB scores greater than 9/yes to question 10 on [place] Postpartum Depression Screen.    Virgie Dad Siegfried Vieth, MSW, LCSW [facility] [place] [phone]

## 2019-01-20 NOTE — Discharge Instructions (Signed)

## 2019-01-27 ENCOUNTER — Other Ambulatory Visit (HOSPITAL_COMMUNITY)
Admission: RE | Admit: 2019-01-27 | Discharge: 2019-01-27 | Disposition: A | Discharge status: Home / Self Care | Payer: BC Managed Care – PPO | Source: Ambulatory Visit

## 2019-01-29 ENCOUNTER — Inpatient Hospital Stay (HOSPITAL_COMMUNITY): Payer: BC Managed Care – PPO

## 2019-01-29 ENCOUNTER — Inpatient Hospital Stay (HOSPITAL_COMMUNITY)
Admission: AD | Admit: 2019-01-29 | Payer: BC Managed Care – PPO | Source: Home / Self Care | Admitting: Obstetrics & Gynecology

## 2020-01-28 ENCOUNTER — Other Ambulatory Visit: Payer: Self-pay | Admitting: Nurse Practitioner

## 2020-01-28 DIAGNOSIS — L042 Acute lymphadenitis of upper limb: Secondary | ICD-10-CM

## 2020-02-04 ENCOUNTER — Ambulatory Visit
Admission: RE | Admit: 2020-02-04 | Discharge: 2020-02-04 | Disposition: A | Discharge status: Home / Self Care | Payer: BC Managed Care – PPO | Source: Ambulatory Visit | Attending: Nurse Practitioner | Admitting: Nurse Practitioner

## 2020-02-04 ENCOUNTER — Other Ambulatory Visit: Payer: Self-pay | Admitting: Nurse Practitioner

## 2020-02-04 ENCOUNTER — Other Ambulatory Visit: Payer: Self-pay

## 2020-02-04 ENCOUNTER — Other Ambulatory Visit (HOSPITAL_COMMUNITY)
Admission: RE | Admit: 2020-02-04 | Discharge: 2020-02-04 | Disposition: A | Discharge status: Home / Self Care | Payer: BC Managed Care – PPO | Source: Ambulatory Visit | Attending: Diagnostic Radiology | Admitting: Diagnostic Radiology

## 2020-02-04 DIAGNOSIS — L042 Acute lymphadenitis of upper limb: Secondary | ICD-10-CM

## 2020-02-06 LAB — SURGICAL PATHOLOGY

## 2020-02-11 ENCOUNTER — Other Ambulatory Visit: Payer: BC Managed Care – PPO

## 2020-02-13 ENCOUNTER — Inpatient Hospital Stay: Payer: BC Managed Care – PPO

## 2020-02-13 ENCOUNTER — Other Ambulatory Visit: Payer: Self-pay

## 2020-02-13 ENCOUNTER — Encounter: Payer: Self-pay | Admitting: Oncology

## 2020-02-13 ENCOUNTER — Inpatient Hospital Stay: Payer: BC Managed Care – PPO | Attending: Oncology | Admitting: Oncology

## 2020-02-13 VITALS — BP 124/78 | HR 80 | Temp 98.3°F | Resp 18 | Ht 64.0 in | Wt 154.0 lb

## 2020-02-13 DIAGNOSIS — Z8041 Family history of malignant neoplasm of ovary: Secondary | ICD-10-CM

## 2020-02-13 DIAGNOSIS — C8590 Non-Hodgkin lymphoma, unspecified, unspecified site: Secondary | ICD-10-CM

## 2020-02-13 DIAGNOSIS — Z23 Encounter for immunization: Secondary | ICD-10-CM | POA: Insufficient documentation

## 2020-02-13 DIAGNOSIS — C8464 Anaplastic large cell lymphoma, ALK-positive, lymph nodes of axilla and upper limb: Secondary | ICD-10-CM | POA: Diagnosis not present

## 2020-02-13 DIAGNOSIS — Z7189 Other specified counseling: Secondary | ICD-10-CM | POA: Diagnosis not present

## 2020-02-13 LAB — CBC WITH DIFFERENTIAL/PLATELET
Abs Immature Granulocytes: 0.03 10*3/uL (ref 0.00–0.07)
Basophils Absolute: 0 10*3/uL (ref 0.0–0.1)
Basophils Relative: 1 %
Eosinophils Absolute: 0.1 10*3/uL (ref 0.0–0.5)
Eosinophils Relative: 1 %
HCT: 37.9 % (ref 36.0–46.0)
Hemoglobin: 12.4 g/dL (ref 12.0–15.0)
Immature Granulocytes: 0 %
Lymphocytes Relative: 35 %
Lymphs Abs: 2.5 10*3/uL (ref 0.7–4.0)
MCH: 29.7 pg (ref 26.0–34.0)
MCHC: 32.7 g/dL (ref 30.0–36.0)
MCV: 90.7 fL (ref 80.0–100.0)
Monocytes Absolute: 0.5 10*3/uL (ref 0.1–1.0)
Monocytes Relative: 7 %
Neutro Abs: 4 10*3/uL (ref 1.7–7.7)
Neutrophils Relative %: 56 %
Platelets: 290 10*3/uL (ref 150–400)
RBC: 4.18 MIL/uL (ref 3.87–5.11)
RDW: 13.2 % (ref 11.5–15.5)
WBC: 7.2 10*3/uL (ref 4.0–10.5)
nRBC: 0 % (ref 0.0–0.2)

## 2020-02-13 LAB — COMPREHENSIVE METABOLIC PANEL
ALT: 13 U/L (ref 0–44)
AST: 16 U/L (ref 15–41)
Albumin: 4.5 g/dL (ref 3.5–5.0)
Alkaline Phosphatase: 65 U/L (ref 38–126)
Anion gap: 9 (ref 5–15)
BUN: 12 mg/dL (ref 6–20)
CO2: 27 mmol/L (ref 22–32)
Calcium: 9.3 mg/dL (ref 8.9–10.3)
Chloride: 103 mmol/L (ref 98–111)
Creatinine, Ser: 0.79 mg/dL (ref 0.44–1.00)
GFR, Estimated: >60 mL/min (ref >60)
Glucose, Bld: 95 mg/dL (ref 70–99)
Potassium: 4.2 mmol/L (ref 3.5–5.1)
Sodium: 139 mmol/L (ref 135–145)
Total Bilirubin: 0.5 mg/dL (ref 0.3–1.2)
Total Protein: 8 g/dL (ref 6.5–8.1)

## 2020-02-13 LAB — HIV ANTIBODY (ROUTINE TESTING W REFLEX): HIV Screen 4th Generation wRfx: NONREACTIVE

## 2020-02-13 LAB — URIC ACID: Uric Acid, Serum: 5 mg/dL (ref 2.5–7.1)

## 2020-02-13 LAB — LACTATE DEHYDROGENASE: LDH: 129 U/L (ref 98–192)

## 2020-02-13 MED ORDER — INFLUENZA VAC SPLIT QUAD 0.5 ML IM SUSY
0.5000 mL | PREFILLED_SYRINGE | Freq: Once | INTRAMUSCULAR | Status: AC
  Administered 2020-02-13: 0.5 mL via INTRAMUSCULAR
  Filled 2020-02-13: qty 0.5

## 2020-02-13 MED ORDER — ACETAMINOPHEN-CODEINE #3 300-30 MG PO TABS: 1.0000 | ORAL_TABLET | Freq: Four times a day (QID) | ORAL | 0 refills | Status: DC | PRN

## 2020-02-13 NOTE — Progress Notes (Signed)
Hematology/Oncology Consult note [facility] [phone] [phone]   Patient Care Team: Patient, No Pcp Per as PCP - General (General Practice)  REFERRING PROVIDER: Arlyce Harman, NP  CHIEF COMPLAINTS/REASON FOR VISIT:  Evaluation of lymphoma  HISTORY OF PRESENTING ILLNESS:   Angel Tate is a  29 y.o.  female with PMH listed below was seen in consultation at the request of  Thongteum, Auma N, NP  for evaluation of lymphoma Patient reports feeling right axillary mass in June 2021.  Initially the mass did not bother her.  Patient developed a rash in the right axillary/upper outer quadrant of the breast and had additional work-up.  Patient was treated with a 10-day course of doxycycline for possible infection.  Did not improve. 02/04/2020, targeted ultrasound showed right axillary mass measuring 6.2 x 4 by 4 cm.  There are 2 adjacent smaller axillary mass measuring 2.0 x 1.2 x 1.4 cm and 0.8 x 0.7 x 0.9 cm.  Mammogram showed dense fibroglandular.  No suspicious mass or malignant type microcalcifications or distortion detected in either breast. Patient underwent ultrasound-guided core biopsy of the right axillary mass. 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  Ki-67 80-90% Flow cytometry has insufficient cells for analysis. Patient was referred to establish care for further evaluation and management. Retrospectively, patient recalls some night sweating.  No fever, chills, unintentional weight loss.  She has good energy level and appetite.  Today she was accompanied by her husband.  She has 2.  Children  Review of Systems  Constitutional: Negative for appetite change, chills, fatigue and fever.  HENT:   Negative for hearing loss and voice change.   Eyes: Negative for eye problems.  Respiratory: Negative for chest tightness and cough.   Cardiovascular: Negative for chest pain.  Gastrointestinal: Negative for  abdominal distention, abdominal pain and blood in stool.  Endocrine: Negative for hot flashes.       Night sweats  Genitourinary: Negative for difficulty urinating and frequency.   Musculoskeletal: Negative for arthralgias.  Skin: Negative for itching and rash.  Neurological: Negative for extremity weakness.  Hematological: Negative for adenopathy.  Psychiatric/Behavioral: Negative for confusion.    MEDICAL HISTORY:  Past Medical History:  Diagnosis Date  . Anxiety   . Depression   . Medical history non-contributory     SURGICAL HISTORY: Past Surgical History:  Procedure Laterality Date  . DILATION AND CURETTAGE OF UTERUS    . DILATION AND EVACUATION N/A 02/17/2017   Procedure: DILATATION AND EVACUATION;  Surgeon: Janyth Pupa, DO;  Location: [facility];  Service: Gynecology;  Laterality: N/A;    SOCIAL HISTORY: Social History   Socioeconomic History  . Marital status: Married    Spouse name: [name]  . Number of children: 1  . Years of education: Not on file  . Highest education level: Not on file  Occupational History  . Occupation: social media- marketing   Tobacco Use  . Smoking status: Never Smoker  . Smokeless tobacco: Never Used  Vaping Use  . Vaping Use: Never used  Substance and Sexual Activity  . Alcohol use: Yes    Comment: socially  . Drug use: No  . Sexual activity: Yes    Birth control/protection: None  Other Topics Concern  . Not on file  Social History Narrative  . Not on file   Social Determinants of Health   Financial Resource Strain:   . Difficulty of Paying Living Expenses: Not on file  Food  Insecurity:   . Worried About Charity fundraiser in the Last Year: Not on file  . Ran Out of Food in the Last Year: Not on file  Transportation Needs:   . Lack of Transportation (Medical): Not on file  . Lack of Transportation (Non-Medical): Not on file  Physical Activity:   . Days of Exercise per Week: Not on file  . Minutes of Exercise per  Session: Not on file  Stress:   . Feeling of Stress : Not on file  Social Connections:   . Frequency of Communication with Friends and Family: Not on file  . Frequency of Social Gatherings with Friends and Family: Not on file  . Attends Religious Services: Not on file  . Active Member of Clubs or Organizations: Not on file  . Attends Archivist Meetings: Not on file  . Marital Status: Not on file  Intimate Partner Violence:   . Fear of Current or Ex-Partner: Not on file  . Emotionally Abused: Not on file  . Physically Abused: Not on file  . Sexually Abused: Not on file    FAMILY HISTORY: Family History  Problem Relation Age of Onset  . Hypertension Maternal Grandmother   . Asthma Maternal Aunt   . Ovarian cancer Paternal Grandmother     ALLERGIES:  has No Known Allergies.  MEDICATIONS:  Current Outpatient Medications  Medication Sig Dispense Refill  . ibuprofen (ADVIL,MOTRIN) 200 MG tablet Take 3 tablets (600 mg total) by mouth every 6 (six) hours as needed (for pain.). 30 tablet 0  . acetaminophen-codeine (TYLENOL #3) 300-30 MG tablet Take 1 tablet by mouth every 6 (six) hours as needed for moderate pain. 30 tablet 0  . sertraline (ZOLOFT) 100 MG tablet Take 100 mg by mouth at bedtime. (Patient not taking: Reported on 02/13/2020)  11   No current facility-administered medications for this visit.     PHYSICAL EXAMINATION: ECOG PERFORMANCE STATUS: 0 - Asymptomatic Vitals:   02/13/20 1103  BP: 124/78  Pulse: 80  Resp: 18  Temp: 98.3 F (36.8 C)   Filed Weights   02/13/20 1103  Weight: 154 lb (69.9 kg)    Physical Exam Constitutional:      General: She is not in acute distress. HENT:     Head: Normocephalic and atraumatic.  Eyes:     General: No scleral icterus. Cardiovascular:     Rate and Rhythm: Normal rate and regular rhythm.     Heart sounds: Normal heart sounds.  Pulmonary:     Effort: Pulmonary effort is normal. No respiratory distress.      Breath sounds: No wheezing.  Abdominal:     General: Bowel sounds are normal. There is no distension.     Palpations: Abdomen is soft.  Musculoskeletal:        General: No deformity. Normal range of motion.     Cervical back: Normal range of motion and neck supple.  Skin:    General: Skin is warm and dry.     Findings: No erythema or rash.  Neurological:     Mental Status: She is alert and oriented to person, place, and time. Mental status is at baseline.     Cranial Nerves: No cranial nerve deficit.     Coordination: Coordination normal.  Psychiatric:        Mood and Affect: Mood normal.   Right axillary palpable fullness.  LABORATORY DATA:  I have reviewed the data as listed Lab Results  Component Value  Date   WBC 7.2 02/13/2020   HGB 12.4 02/13/2020   HCT 37.9 02/13/2020   MCV 90.7 02/13/2020   PLT 290 02/13/2020   Recent Labs    02/13/20 1146  NA 139  K 4.2  CL 103  CO2 27  GLUCOSE 95  BUN 12  CREATININE 0.79  CALCIUM 9.3  GFRNONAA >60  PROT 8.0  ALBUMIN 4.5  AST 16  ALT 13  ALKPHOS 65  BILITOT 0.5   Iron/TIBC/Ferritin/ %Sat No results found for: IRON, TIBC, FERRITIN, IRONPCTSAT    RADIOGRAPHIC STUDIES: I have personally reviewed the radiological images as listed and agreed with the findings in the report. MM DIAG BREAST TOMO BILATERAL  Result Date: 02/04/2020 CLINICAL DATA:  29 year old female complaining of a palpable right axillary mass. Patient states that she has palpated the abnormality since June of 2021 and more recently developed a rash in the right axilla/upper outer quadrant of the breast. Patient has not received the COVID-19 vaccination and she has no underlying medical conditions. EXAM: DIGITAL DIAGNOSTIC BILATERAL MAMMOGRAM WITH CAD AND TOMO ULTRASOUND RIGHT BREAST COMPARISON:  None. ACR Breast Density Category c: The breast tissue is heterogeneously dense, which may obscure small masses. FINDINGS: I palpate fullness in the right axilla.  There is erythema or non raised rash in the upper outer quadrant/right axilla. Targeted ultrasound shows a right axillary mass measuring 6.2 x 4.0 x 4.0 cm. There are 2 adjacent smaller axillary masses measuring 2.0 x 1.2 x 1.4 cm and 0.8 x 0.7 x 0.9 cm. Two view mammogram shows dense fibroglandular tissue. No suspicious mass, malignant type microcalcifications or distortion detected in either breast. Mammographic images were processed with CAD. IMPRESSION: Suspicious right axillary adenopathy. RECOMMENDATION: Ultrasound-guided core biopsy of the largest right axillary mass is recommended. Biopsy has been scheduled on 02/11/2020. The patient was placed on a 10 day course of doxycycline 100 mg p.o. b.i.d. for the possible rash in the upper-outer quadrant/right axilla. I have discussed the findings and recommendations with the patient. If applicable, a reminder letter will be sent to the patient regarding the next appointment. BI-RADS CATEGORY  4: Suspicious. Electronically Signed   By: Lillia Mountain M.D.   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 Core biopsy reveals sheets of large atypical lymphoid cells with frequent mitotic figures and apoptotic debris. Flow cytometry has insufficient cells for analysis. 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 This was found to be concordant by Dr. Hassan Rowan. The patient reported doing well on 02/05/20 after the biopsy with tenderness at the site. Post biopsy instructions and care were reviewed and questions were answered. The patient was encouraged to call [facility] for any additional concerns. Results and recommendations called and faxed to [facility] LPN with [facility] and Gynecology on 02/06/20. Dr. Delice Bison NP will arrange Hematology/Oncology referral per patient request. Pathology results reported by Stacie Acres RN on 02/06/2020. Electronically Signed   By: Margarette Canada M.D.   On: 02/06/2020 12:21   Result Date: 02/06/2020 CLINICAL DATA:  29 year old female for tissue sampling of 6.2 cm RIGHT axillary lymph node/mass. EXAM: [place] AXILLARY NODE CORE BIOPSY RIGHT COMPARISON:  Previous exam(s). PROCEDURE: I met with the patient and we discussed the procedure of ultrasound-guided biopsy, including benefits and alternatives. We  discussed the high likelihood of a successful procedure. We discussed the risks of the procedure, including infection, bleeding, tissue injury, clip migration, and inadequate sampling. Informed written consent was given. The usual time-out protocol was performed immediately prior to the procedure. Using sterile technique and 1% Lidocaine as local anesthetic, under direct ultrasound visualization, a 12 gauge spring-loaded device was used to perform biopsy of the 6.2 cm RIGHT axillary lymph node/mass using a LATERAL approach. Specimens were placed in formalin and saline. At the conclusion of the procedure [facility] tissue marker clip was deployed into the biopsy cavity, with satisfactory placement confirmed sonographically. IMPRESSION: Ultrasound guided biopsy of 6.2 cm RIGHT axillary lymph node/mass. No apparent complications. Electronically Signed: By: Margarette Canada M.D. On: 02/04/2020 14:48   [place] AXILLA RIGHT  Result Date: 02/04/2020 CLINICAL DATA:  29 year old female complaining of a palpable right axillary mass. Patient states that she has palpated the abnormality since June of 2021 and more recently developed a rash in the right axilla/upper outer quadrant of the breast. Patient has not received the COVID-19 vaccination and she has no underlying medical conditions. EXAM: DIGITAL DIAGNOSTIC BILATERAL MAMMOGRAM WITH CAD AND TOMO ULTRASOUND RIGHT BREAST COMPARISON:  None. ACR Breast Density  Category c: The breast tissue is heterogeneously dense, which may obscure small masses. FINDINGS: I palpate fullness in the right axilla. There is erythema or non raised rash in the upper outer quadrant/right axilla. Targeted ultrasound shows a right axillary mass measuring 6.2 x 4.0 x 4.0 cm. There are 2 adjacent smaller axillary masses measuring 2.0 x 1.2 x 1.4 cm and 0.8 x 0.7 x 0.9 cm. Two view mammogram shows dense fibroglandular tissue. No suspicious mass, malignant type microcalcifications or distortion detected in either breast. Mammographic images were processed with CAD. IMPRESSION: Suspicious right axillary adenopathy. RECOMMENDATION: Ultrasound-guided core biopsy of the largest right axillary mass is recommended. Biopsy has been scheduled on 02/11/2020. The patient was placed on a 10 day course of doxycycline 100 mg p.o. b.i.d. for the possible rash in the upper-outer quadrant/right axilla. I have discussed the findings and recommendations with the patient. If applicable, a reminder letter will be sent to the patient regarding the next appointment. BI-RADS CATEGORY  4: Suspicious. Electronically Signed   By: Lillia Mountain M.D.   On: 02/04/2020 13:48      ASSESSMENT & PLAN:  1. 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 Goals of care, counseling/discussion    #Right axillary anaplastic ALK positive large cell lymphoma Pathology report were reviewed and discussed with patient.  For work-up -Check labs, CBC, CMP, LDH, uric acid, HIV, hepatitis panel, peripheral smear, flowcytometry, EBV and HTLV1/2 -Obtain PET scan for staging -Echocardiogram for evaluation of baseline cardiac function -Discussed about the need of Mediport placement -Bone marrow biopsy  The diagnosis and care plan were discussed with patient in detail.   The goal of treatment is with curative intent.  Discussed with patient that ALCL is a type of aggressive lymphoma and mainstay of  first-line treatment is systemic chemotherapy.  Chemotherapy education was provided.  I explained to the patient the risks and benefits of chemotherapy BV+ CHP  including all but not limited to hair loss, mouth sore, nausea, vomiting, diarrhea, low blood counts, bleeding, neuropathy, heart failure,  infertility and risk of life threatening infection and even death, secondary malignancy etc.   Infertility was discussed with patient and she does not desire to preserve her fertility..  Also advised patient to  use contraceptive measures during treatments as chemotherapy treatment is teratogenic for fetus. Patient voices understanding and willing to proceed chemotherapy.   # Chemotherapy education; refer to vascular surgery Medi- port placement. Antiemetics-Zofran and Compazine; allopurinol, EMLA cream sent to pharmacy #Neoplasm related pain, 8 out of 10, patient has taken ibuprofen frequently.  Tylenol did not help her symptoms.  Recommend patient to utilize Tylenol #3 alternating with ibuprofen and see if symptoms can be better controlled.  Patient verbalized understanding that medications should not be sold or shared, taken with alcohol, or used while driving. Patient educated that medications should not be bitten, chewed, or crushed. patient has been made aware of the sside effects and treatment/ prevention  of constipation,  respiratory depression,  and mental status changes, even lead to overdose that may result in death, if used outside of the parameters that we discussed.   Supportive care measures are necessary for patient well-being and will be provided as necessary. We spent sufficient time to discuss many aspect of care, questions were answered to patient's satisfaction.    Orders Placed This Encounter  Procedures  . NM PET Image Initial (PI) Skull Base To Thigh    Standing Status:   Future    Standing Expiration Date:   02/12/2021    Order Specific Question:   If indicated for the ordered  procedure, I authorize the administration of a radiopharmaceutical per Radiology protocol    Answer:   Yes    Order Specific Question:   Is the patient pregnant?    Answer:   No    Order Specific Question:   Preferred imaging location?    Answer:   [facility]  . CBC with Differential/Platelet    Standing Status:   Future    Number of Occurrences:   1    Standing Expiration Date:   02/12/2021  . Comprehensive metabolic panel    Standing Status:   Future    Number of Occurrences:   1    Standing Expiration Date:   02/12/2021  . Lactate dehydrogenase    Standing Status:   Future    Number of Occurrences:   1    Standing Expiration Date:   02/12/2021  . Hepatitis panel, acute    Standing Status:   Future    Number of Occurrences:   1    Standing Expiration Date:   02/12/2021  . HIV Antibody (routine testing w rflx)    Standing Status:   Future    Number of Occurrences:   1    Standing Expiration Date:   02/12/2021  . Uric acid    Standing Status:   Future    Number of Occurrences:   1    Standing Expiration Date:   02/12/2021  . ECHOCARDIOGRAM LIMITED    Standing Status:   Future    Standing Expiration Date:   02/12/2021    Order Specific Question:   Where should this test be performed    Answer:   [facility]    Order Specific Question:   Perflutren DEFINITY (image enhancing agent) should be administered unless hypersensitivity or allergy exist    Answer:   Administer Perflutren    Order Specific Question:   Reason for exam-Echo    Answer:   Other-Full Diagnosis List    Order Specific Question:   Full ICD-10/Reason for Exam    Answer:   Lymphoma [facility]) [[id number]]    Order Specific Question:   Other Comments  Answer:   lymphoma    All questions were answered. The patient knows to call the clinic with any problems questions or concerns.  cc Thongteum, Maryjo [place], NP    Return of visit: to be determined.  Thank you for this kind referral and the opportunity to  participate in the care of this patient. A copy of today's note is routed to referring provider    Earlie Server, MD, PhD Hematology Oncology [facility] Pager- [phone] 02/13/2020

## 2020-02-13 NOTE — H&P (View-Only) (Signed)
Hematology/Oncology Consult note [facility] [phone] [phone]   Patient Care Team: Patient, No Pcp Per as PCP - General (General Practice)  REFERRING PROVIDER: Arlyce Harman, NP  CHIEF COMPLAINTS/REASON FOR VISIT:  Evaluation of lymphoma  HISTORY OF PRESENTING ILLNESS:   Angel Tate is a  29 y.o.  female with PMH listed below was seen in consultation at the request of  Thongteum, Auma N, NP  for evaluation of lymphoma Patient reports feeling right axillary mass in June 2021.  Initially the mass did not bother her.  Patient developed a rash in the right axillary/upper outer quadrant of the breast and had additional work-up.  Patient was treated with a 10-day course of doxycycline for possible infection.  Did not improve. 02/04/2020, targeted ultrasound showed right axillary mass measuring 6.2 x 4 by 4 cm.  There are 2 adjacent smaller axillary mass measuring 2.0 x 1.2 x 1.4 cm and 0.8 x 0.7 x 0.9 cm.  Mammogram showed dense fibroglandular.  No suspicious mass or malignant type microcalcifications or distortion detected in either breast. Patient underwent ultrasound-guided core biopsy of the right axillary mass. 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  Ki-67 80-90% Flow cytometry has insufficient cells for analysis. Patient was referred to establish care for further evaluation and management. Retrospectively, patient recalls some night sweating.  No fever, chills, unintentional weight loss.  She has good energy level and appetite.  Today she was accompanied by her husband.  She has 2.  Children  Review of Systems  Constitutional: Negative for appetite change, chills, fatigue and fever.  HENT:   Negative for hearing loss and voice change.   Eyes: Negative for eye problems.  Respiratory: Negative for chest tightness and cough.   Cardiovascular: Negative for chest pain.  Gastrointestinal: Negative for  abdominal distention, abdominal pain and blood in stool.  Endocrine: Negative for hot flashes.       Night sweats  Genitourinary: Negative for difficulty urinating and frequency.   Musculoskeletal: Negative for arthralgias.  Skin: Negative for itching and rash.  Neurological: Negative for extremity weakness.  Hematological: Negative for adenopathy.  Psychiatric/Behavioral: Negative for confusion.    MEDICAL HISTORY:  Past Medical History:  Diagnosis Date  . Anxiety   . Depression   . Medical history non-contributory     SURGICAL HISTORY: Past Surgical History:  Procedure Laterality Date  . DILATION AND CURETTAGE OF UTERUS    . DILATION AND EVACUATION N/A 02/17/2017   Procedure: DILATATION AND EVACUATION;  Surgeon: Janyth Pupa, DO;  Location: [facility];  Service: Gynecology;  Laterality: N/A;    SOCIAL HISTORY: Social History   Socioeconomic History  . Marital status: Married    Spouse name: [name]  . Number of children: 1  . Years of education: Not on file  . Highest education level: Not on file  Occupational History  . Occupation: social media- marketing   Tobacco Use  . Smoking status: Never Smoker  . Smokeless tobacco: Never Used  Vaping Use  . Vaping Use: Never used  Substance and Sexual Activity  . Alcohol use: Yes    Comment: socially  . Drug use: No  . Sexual activity: Yes    Birth control/protection: None  Other Topics Concern  . Not on file  Social History Narrative  . Not on file   Social Determinants of Health   Financial Resource Strain:   . Difficulty of Paying Living Expenses: Not on file  Food  Insecurity:   . Worried About Charity fundraiser in the Last Year: Not on file  . Ran Out of Food in the Last Year: Not on file  Transportation Needs:   . Lack of Transportation (Medical): Not on file  . Lack of Transportation (Non-Medical): Not on file  Physical Activity:   . Days of Exercise per Week: Not on file  . Minutes of Exercise per  Session: Not on file  Stress:   . Feeling of Stress : Not on file  Social Connections:   . Frequency of Communication with Friends and Family: Not on file  . Frequency of Social Gatherings with Friends and Family: Not on file  . Attends Religious Services: Not on file  . Active Member of Clubs or Organizations: Not on file  . Attends Archivist Meetings: Not on file  . Marital Status: Not on file  Intimate Partner Violence:   . Fear of Current or Ex-Partner: Not on file  . Emotionally Abused: Not on file  . Physically Abused: Not on file  . Sexually Abused: Not on file    FAMILY HISTORY: Family History  Problem Relation Age of Onset  . Hypertension Maternal Grandmother   . Asthma Maternal Aunt   . Ovarian cancer Paternal Grandmother     ALLERGIES:  has No Known Allergies.  MEDICATIONS:  Current Outpatient Medications  Medication Sig Dispense Refill  . ibuprofen (ADVIL,MOTRIN) 200 MG tablet Take 3 tablets (600 mg total) by mouth every 6 (six) hours as needed (for pain.). 30 tablet 0  . acetaminophen-codeine (TYLENOL #3) 300-30 MG tablet Take 1 tablet by mouth every 6 (six) hours as needed for moderate pain. 30 tablet 0  . sertraline (ZOLOFT) 100 MG tablet Take 100 mg by mouth at bedtime. (Patient not taking: Reported on 02/13/2020)  11   No current facility-administered medications for this visit.     PHYSICAL EXAMINATION: ECOG PERFORMANCE STATUS: 0 - Asymptomatic Vitals:   02/13/20 1103  BP: 124/78  Pulse: 80  Resp: 18  Temp: 98.3 F (36.8 C)   Filed Weights   02/13/20 1103  Weight: 154 lb (69.9 kg)    Physical Exam Constitutional:      General: She is not in acute distress. HENT:     Head: Normocephalic and atraumatic.  Eyes:     General: No scleral icterus. Cardiovascular:     Rate and Rhythm: Normal rate and regular rhythm.     Heart sounds: Normal heart sounds.  Pulmonary:     Effort: Pulmonary effort is normal. No respiratory distress.      Breath sounds: No wheezing.  Abdominal:     General: Bowel sounds are normal. There is no distension.     Palpations: Abdomen is soft.  Musculoskeletal:        General: No deformity. Normal range of motion.     Cervical back: Normal range of motion and neck supple.  Skin:    General: Skin is warm and dry.     Findings: No erythema or rash.  Neurological:     Mental Status: She is alert and oriented to person, place, and time. Mental status is at baseline.     Cranial Nerves: No cranial nerve deficit.     Coordination: Coordination normal.  Psychiatric:        Mood and Affect: Mood normal.   Right axillary palpable fullness.  LABORATORY DATA:  I have reviewed the data as listed Lab Results  Component Value  Date   WBC 7.2 02/13/2020   HGB 12.4 02/13/2020   HCT 37.9 02/13/2020   MCV 90.7 02/13/2020   PLT 290 02/13/2020   Recent Labs    02/13/20 1146  NA 139  K 4.2  CL 103  CO2 27  GLUCOSE 95  BUN 12  CREATININE 0.79  CALCIUM 9.3  GFRNONAA >60  PROT 8.0  ALBUMIN 4.5  AST 16  ALT 13  ALKPHOS 65  BILITOT 0.5   Iron/TIBC/Ferritin/ %Sat No results found for: IRON, TIBC, FERRITIN, IRONPCTSAT    RADIOGRAPHIC STUDIES: I have personally reviewed the radiological images as listed and agreed with the findings in the report. MM DIAG BREAST TOMO BILATERAL  Result Date: 02/04/2020 CLINICAL DATA:  29 year old female complaining of a palpable right axillary mass. Patient states that she has palpated the abnormality since June of 2021 and more recently developed a rash in the right axilla/upper outer quadrant of the breast. Patient has not received the COVID-19 vaccination and she has no underlying medical conditions. EXAM: DIGITAL DIAGNOSTIC BILATERAL MAMMOGRAM WITH CAD AND TOMO ULTRASOUND RIGHT BREAST COMPARISON:  None. ACR Breast Density Category c: The breast tissue is heterogeneously dense, which may obscure small masses. FINDINGS: I palpate fullness in the right axilla.  There is erythema or non raised rash in the upper outer quadrant/right axilla. Targeted ultrasound shows a right axillary mass measuring 6.2 x 4.0 x 4.0 cm. There are 2 adjacent smaller axillary masses measuring 2.0 x 1.2 x 1.4 cm and 0.8 x 0.7 x 0.9 cm. Two view mammogram shows dense fibroglandular tissue. No suspicious mass, malignant type microcalcifications or distortion detected in either breast. Mammographic images were processed with CAD. IMPRESSION: Suspicious right axillary adenopathy. RECOMMENDATION: Ultrasound-guided core biopsy of the largest right axillary mass is recommended. Biopsy has been scheduled on 02/11/2020. The patient was placed on a 10 day course of doxycycline 100 mg p.o. b.i.d. for the possible rash in the upper-outer quadrant/right axilla. I have discussed the findings and recommendations with the patient. If applicable, a reminder letter will be sent to the patient regarding the next appointment. BI-RADS CATEGORY  4: Suspicious. Electronically Signed   By: Lillia Mountain M.D.   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 Core biopsy reveals sheets of large atypical lymphoid cells with frequent mitotic figures and apoptotic debris. Flow cytometry has insufficient cells for analysis. 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 This was found to be concordant by Dr. Hassan Rowan. The patient reported doing well on 02/05/20 after the biopsy with tenderness at the site. Post biopsy instructions and care were reviewed and questions were answered. The patient was encouraged to call [facility] for any additional concerns. Results and recommendations called and faxed to [facility] with [facility] Gynecology on 02/06/20. Dr. Delice Bison NP will arrange Hematology/Oncology referral per patient request. Pathology results reported by Stacie Acres RN on 02/06/2020. Electronically Signed   By: Margarette Canada M.D.   On: 02/06/2020 12:21   Result Date: 02/06/2020 CLINICAL DATA:  29 year old female for tissue sampling of 6.2 cm RIGHT axillary lymph node/mass. EXAM: [place] AXILLARY NODE CORE BIOPSY RIGHT COMPARISON:  Previous exam(s). PROCEDURE: I met with the patient and we discussed the procedure of ultrasound-guided biopsy, including benefits and alternatives. We  discussed the high likelihood of a successful procedure. We discussed the risks of the procedure, including infection, bleeding, tissue injury, clip migration, and inadequate sampling. Informed written consent was given. The usual time-out protocol was performed immediately prior to the procedure. Using sterile technique and 1% Lidocaine as local anesthetic, under direct ultrasound visualization, a 12 gauge spring-loaded device was used to perform biopsy of the 6.2 cm RIGHT axillary lymph node/mass using a LATERAL approach. Specimens were placed in formalin and saline. At the conclusion of the procedure [facility] tissue marker clip was deployed into the biopsy cavity, with satisfactory placement confirmed sonographically. IMPRESSION: Ultrasound guided biopsy of 6.2 cm RIGHT axillary lymph node/mass. No apparent complications. Electronically Signed: By: Margarette Canada M.D. On: 02/04/2020 14:48   [place] AXILLA RIGHT  Result Date: 02/04/2020 CLINICAL DATA:  29 year old female complaining of a palpable right axillary mass. Patient states that she has palpated the abnormality since June of 2021 and more recently developed a rash in the right axilla/upper outer quadrant of the breast. Patient has not received the COVID-19 vaccination and she has no underlying medical conditions. EXAM: DIGITAL DIAGNOSTIC BILATERAL MAMMOGRAM WITH CAD AND TOMO ULTRASOUND RIGHT BREAST COMPARISON:  None. ACR Breast Density  Category c: The breast tissue is heterogeneously dense, which may obscure small masses. FINDINGS: I palpate fullness in the right axilla. There is erythema or non raised rash in the upper outer quadrant/right axilla. Targeted ultrasound shows a right axillary mass measuring 6.2 x 4.0 x 4.0 cm. There are 2 adjacent smaller axillary masses measuring 2.0 x 1.2 x 1.4 cm and 0.8 x 0.7 x 0.9 cm. Two view mammogram shows dense fibroglandular tissue. No suspicious mass, malignant type microcalcifications or distortion detected in either breast. Mammographic images were processed with CAD. IMPRESSION: Suspicious right axillary adenopathy. RECOMMENDATION: Ultrasound-guided core biopsy of the largest right axillary mass is recommended. Biopsy has been scheduled on 02/11/2020. The patient was placed on a 10 day course of doxycycline 100 mg p.o. b.i.d. for the possible rash in the upper-outer quadrant/right axilla. I have discussed the findings and recommendations with the patient. If applicable, a reminder letter will be sent to the patient regarding the next appointment. BI-RADS CATEGORY  4: Suspicious. Electronically Signed   By: Lillia Mountain M.D.   On: 02/04/2020 13:48      ASSESSMENT & PLAN:  1. 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 Goals of care, counseling/discussion    #Right axillary anaplastic ALK positive large cell lymphoma Pathology report were reviewed and discussed with patient.  For work-up -Check labs, CBC, CMP, LDH, uric acid, HIV, hepatitis panel, peripheral smear, flowcytometry, EBV and HTLV1/2 -Obtain PET scan for staging -Echocardiogram for evaluation of baseline cardiac function -Discussed about the need of Mediport placement -Bone marrow biopsy  The diagnosis and care plan were discussed with patient in detail.   The goal of treatment is with curative intent.  Discussed with patient that ALCL is a type of aggressive lymphoma and mainstay of  first-line treatment is systemic chemotherapy.  Chemotherapy education was provided.  I explained to the patient the risks and benefits of chemotherapy BV+ CHP  including all but not limited to hair loss, mouth sore, nausea, vomiting, diarrhea, low blood counts, bleeding, neuropathy, heart failure,  infertility and risk of life threatening infection and even death, secondary malignancy etc.   Infertility was discussed with patient and she does not desire to preserve her fertility..  Also advised patient to  use contraceptive measures during treatments as chemotherapy treatment is teratogenic for fetus. Patient voices understanding and willing to proceed chemotherapy.   # Chemotherapy education; refer to vascular surgery Medi- port placement. Antiemetics-Zofran and Compazine; allopurinol, EMLA cream sent to pharmacy #Neoplasm related pain, 8 out of 10, patient has taken ibuprofen frequently.  Tylenol did not help her symptoms.  Recommend patient to utilize Tylenol #3 alternating with ibuprofen and see if symptoms can be better controlled.  Patient verbalized understanding that medications should not be sold or shared, taken with alcohol, or used while driving. Patient educated that medications should not be bitten, chewed, or crushed. patient has been made aware of the sside effects and treatment/ prevention  of constipation,  respiratory depression,  and mental status changes, even lead to overdose that may result in death, if used outside of the parameters that we discussed.   Supportive care measures are necessary for patient well-being and will be provided as necessary. We spent sufficient time to discuss many aspect of care, questions were answered to patient's satisfaction.    Orders Placed This Encounter  Procedures  . NM PET Image Initial (PI) Skull Base To Thigh    Standing Status:   Future    Standing Expiration Date:   02/12/2021    Order Specific Question:   If indicated for the ordered  procedure, I authorize the administration of a radiopharmaceutical per Radiology protocol    Answer:   Yes    Order Specific Question:   Is the patient pregnant?    Answer:   No    Order Specific Question:   Preferred imaging location?    Answer:   [facility]  . CBC with Differential/Platelet    Standing Status:   Future    Number of Occurrences:   1    Standing Expiration Date:   02/12/2021  . Comprehensive metabolic panel    Standing Status:   Future    Number of Occurrences:   1    Standing Expiration Date:   02/12/2021  . Lactate dehydrogenase    Standing Status:   Future    Number of Occurrences:   1    Standing Expiration Date:   02/12/2021  . Hepatitis panel, acute    Standing Status:   Future    Number of Occurrences:   1    Standing Expiration Date:   02/12/2021  . HIV Antibody (routine testing w rflx)    Standing Status:   Future    Number of Occurrences:   1    Standing Expiration Date:   02/12/2021  . Uric acid    Standing Status:   Future    Number of Occurrences:   1    Standing Expiration Date:   02/12/2021  . ECHOCARDIOGRAM LIMITED    Standing Status:   Future    Standing Expiration Date:   02/12/2021    Order Specific Question:   Where should this test be performed    Answer:   [facility]    Order Specific Question:   Perflutren DEFINITY (image enhancing agent) should be administered unless hypersensitivity or allergy exist    Answer:   Administer Perflutren    Order Specific Question:   Reason for exam-Echo    Answer:   Other-Full Diagnosis List    Order Specific Question:   Full ICD-10/Reason for Exam    Answer:   Lymphoma [facility]) [[id number]]    Order Specific Question:   Other Comments  Answer:   lymphoma    All questions were answered. The patient knows to call the clinic with any problems questions or concerns.  cc Thongteum, Maryjo [place], NP    Return of visit: to be determined.  Thank you for this kind referral and the opportunity to  participate in the care of this patient. A copy of today's note is routed to referring provider    Earlie Server, MD, PhD Hematology Oncology [facility] at [facility] Pager- [phone] 02/13/2020

## 2020-02-13 NOTE — Progress Notes (Signed)
Patient here to establish care. Reports continuous pain to right arm, that began at the end of August.

## 2020-02-14 LAB — HEPATITIS PANEL, ACUTE
HCV Ab: NONREACTIVE
Hep A IgM: NONREACTIVE
Hep B C IgM: NONREACTIVE
Hepatitis B Surface Ag: NONREACTIVE

## 2020-02-16 ENCOUNTER — Telehealth: Payer: Self-pay

## 2020-02-16 ENCOUNTER — Other Ambulatory Visit
Admission: RE | Admit: 2020-02-16 | Discharge: 2020-02-16 | Disposition: A | Discharge status: Home / Self Care | Payer: BC Managed Care – PPO | Attending: Oncology | Admitting: Oncology

## 2020-02-16 ENCOUNTER — Other Ambulatory Visit: Payer: Self-pay | Admitting: Radiology

## 2020-02-16 DIAGNOSIS — C8464 Anaplastic large cell lymphoma, ALK-positive, lymph nodes of axilla and upper limb: Secondary | ICD-10-CM | POA: Diagnosis not present

## 2020-02-16 LAB — CBC WITH DIFFERENTIAL/PLATELET
Abs Immature Granulocytes: 0.02 10*3/uL (ref 0.00–0.07)
Basophils Absolute: 0 10*3/uL (ref 0.0–0.1)
Basophils Relative: 0 %
Eosinophils Absolute: 0.2 10*3/uL (ref 0.0–0.5)
Eosinophils Relative: 2 %
HCT: 36.7 % (ref 36.0–46.0)
Hemoglobin: 11.7 g/dL — ABNORMAL LOW (ref 12.0–15.0)
Immature Granulocytes: 0 %
Lymphocytes Relative: 39 %
Lymphs Abs: 2.9 10*3/uL (ref 0.7–4.0)
MCH: 29.5 pg (ref 26.0–34.0)
MCHC: 31.9 g/dL (ref 30.0–36.0)
MCV: 92.7 fL (ref 80.0–100.0)
Monocytes Absolute: 0.6 10*3/uL (ref 0.1–1.0)
Monocytes Relative: 8 %
Neutro Abs: 3.8 10*3/uL (ref 1.7–7.7)
Neutrophils Relative %: 51 %
Platelets: 269 10*3/uL (ref 150–400)
RBC: 3.96 MIL/uL (ref 3.87–5.11)
RDW: 13.3 % (ref 11.5–15.5)
WBC: 7.5 10*3/uL (ref 4.0–10.5)
nRBC: 0 % (ref 0.0–0.2)

## 2020-02-16 LAB — PREGNANCY, URINE: Preg Test, Ur: NEGATIVE

## 2020-02-16 MED ORDER — ACETAMINOPHEN-CODEINE #3 300-30 MG PO TABS: 1.0000 | ORAL_TABLET | Freq: Three times a day (TID) | ORAL | 0 refills | Status: DC | PRN

## 2020-02-16 NOTE — Telephone Encounter (Signed)
Scheduled for BMB on 02/17/20 to arrive at 7:30 am for 8:30 appt.  The IR nurse will call prior to the procedure to go over the instructions.  Please schedule patient for lab at [facility] and inform patient of lab appt detail.

## 2020-02-16 NOTE — Progress Notes (Signed)
Patient on schedule for BMB 10/19, spoke with patient on phone. Made aware to be here @ 0730, NPO after MN as well as have driver post recovery/discharge.

## 2020-02-16 NOTE — Telephone Encounter (Signed)
Dr. Tasia Catchings has spoken to patient and she would like patient to have additional labs and bone marrow biopsy.  Scheduling will get the lab appt scheduled and see if the PET can be done sooner than 10/26.    Bone marrow bx order faxed to specialy scheduling.

## 2020-02-17 ENCOUNTER — Other Ambulatory Visit: Payer: Self-pay

## 2020-02-17 ENCOUNTER — Ambulatory Visit
Admission: RE | Admit: 2020-02-17 | Discharge: 2020-02-17 | Disposition: A | Discharge status: Home / Self Care | Payer: BC Managed Care – PPO | Source: Ambulatory Visit | Attending: Oncology | Admitting: Oncology

## 2020-02-17 DIAGNOSIS — Z79899 Other long term (current) drug therapy: Secondary | ICD-10-CM | POA: Insufficient documentation

## 2020-02-17 DIAGNOSIS — C8464 Anaplastic large cell lymphoma, ALK-positive, lymph nodes of axilla and upper limb: Secondary | ICD-10-CM

## 2020-02-17 DIAGNOSIS — F329 Major depressive disorder, single episode, unspecified: Secondary | ICD-10-CM | POA: Insufficient documentation

## 2020-02-17 DIAGNOSIS — C8590 Non-Hodgkin lymphoma, unspecified, unspecified site: Secondary | ICD-10-CM | POA: Diagnosis present

## 2020-02-17 DIAGNOSIS — F419 Anxiety disorder, unspecified: Secondary | ICD-10-CM | POA: Insufficient documentation

## 2020-02-17 DIAGNOSIS — C8584 Other specified types of non-Hodgkin lymphoma, lymph nodes of axilla and upper limb: Secondary | ICD-10-CM | POA: Diagnosis not present

## 2020-02-17 LAB — CBC WITH DIFFERENTIAL/PLATELET
Abs Immature Granulocytes: 0.03 10*3/uL (ref 0.00–0.07)
Basophils Absolute: 0 10*3/uL (ref 0.0–0.1)
Basophils Relative: 0 %
Eosinophils Absolute: 0.1 10*3/uL (ref 0.0–0.5)
Eosinophils Relative: 2 %
HCT: 37.3 % (ref 36.0–46.0)
Hemoglobin: 12.2 g/dL (ref 12.0–15.0)
Immature Granulocytes: 0 %
Lymphocytes Relative: 27 %
Lymphs Abs: 1.8 10*3/uL (ref 0.7–4.0)
MCH: 29.8 pg (ref 26.0–34.0)
MCHC: 32.7 g/dL (ref 30.0–36.0)
MCV: 91 fL (ref 80.0–100.0)
Monocytes Absolute: 0.5 10*3/uL (ref 0.1–1.0)
Monocytes Relative: 8 %
Neutro Abs: 4.2 10*3/uL (ref 1.7–7.7)
Neutrophils Relative %: 63 %
Platelets: 252 10*3/uL (ref 150–400)
RBC: 4.1 MIL/uL (ref 3.87–5.11)
RDW: 13.6 % (ref 11.5–15.5)
WBC: 6.7 10*3/uL (ref 4.0–10.5)
nRBC: 0 % (ref 0.0–0.2)

## 2020-02-17 MED ORDER — FENTANYL CITRATE (PF) 100 MCG/2ML IJ SOLN
INTRAMUSCULAR | Status: DC | PRN
  Administered 2020-02-17 (×2): 50 ug via INTRAVENOUS

## 2020-02-17 MED ORDER — MIDAZOLAM HCL 2 MG/2ML IJ SOLN
INTRAMUSCULAR | Status: AC
  Filled 2020-02-17: qty 2

## 2020-02-17 MED ORDER — FENTANYL CITRATE (PF) 100 MCG/2ML IJ SOLN
INTRAMUSCULAR | Status: AC
  Filled 2020-02-17: qty 2

## 2020-02-17 MED ORDER — ONDANSETRON HCL 8 MG PO TABS: 8.0000 mg | ORAL_TABLET | Freq: Two times a day (BID) | ORAL | 1 refills | Status: DC | PRN

## 2020-02-17 MED ORDER — LIDOCAINE-PRILOCAINE 2.5-2.5 % EX CREA: TOPICAL_CREAM | CUTANEOUS | 3 refills | Status: DC

## 2020-02-17 MED ORDER — SODIUM CHLORIDE 0.9 % IV SOLN: INTRAVENOUS | Status: DC

## 2020-02-17 MED ORDER — MIDAZOLAM HCL 2 MG/2ML IJ SOLN
INTRAMUSCULAR | Status: DC | PRN
  Administered 2020-02-17 (×2): 1 mg via INTRAVENOUS

## 2020-02-17 MED ORDER — ALLOPURINOL 300 MG PO TABS: 300.0000 mg | ORAL_TABLET | Freq: Every day | ORAL | 3 refills | Status: DC

## 2020-02-17 MED ORDER — PREDNISONE 20 MG PO TABS: 100.0000 mg | ORAL_TABLET | Freq: Every day | ORAL | 7 refills | Status: DC

## 2020-02-17 MED ORDER — HEPARIN SOD (PORK) LOCK FLUSH 100 UNIT/ML IV SOLN
INTRAVENOUS | Status: AC
  Filled 2020-02-17: qty 5

## 2020-02-17 MED ORDER — PROCHLORPERAZINE MALEATE 10 MG PO TABS: 10.0000 mg | ORAL_TABLET | Freq: Four times a day (QID) | ORAL | 6 refills | Status: DC | PRN

## 2020-02-17 NOTE — Progress Notes (Signed)
Patient clinically stable post BMB per DR Pascal Lux, tolerated well. Awake/alert and oriented post procedure. Grandfather at bedside post procedure with update given. Given Versed 2 mg along with Fentanyl 100 mcg for procedure. Report given to [employer] in specials post procedure.

## 2020-02-17 NOTE — Telephone Encounter (Signed)
Done

## 2020-02-17 NOTE — Discharge Instructions (Signed)
Moderate Conscious Sedation, Adult, Care After These instructions provide you with information about caring for yourself after your procedure. Your health care provider may also give you more specific instructions. Your treatment has been planned according to current medical practices, but problems sometimes occur. Call your health care provider if you have any problems or questions after your procedure. What can I expect after the procedure? After your procedure, it is common:  To feel sleepy for several hours.  To feel clumsy and have poor balance for several hours.  To have poor judgment for several hours.  To vomit if you eat too soon. Follow these instructions at home: For at least 24 hours after the procedure:   Do not: ? Participate in activities where you could fall or become injured. ? Drive. ? Use heavy machinery. ? Drink alcohol. ? Take sleeping pills or medicines that cause drowsiness. ? Make important decisions or sign legal documents. ? Take care of children on your own.  Rest. Eating and drinking  Follow the diet recommended by your health care provider.  If you vomit: ? Drink water, juice, or soup when you can drink without vomiting. ? Make sure you have little or no nausea before eating solid foods. General instructions  Have a responsible adult stay with you until you are awake and alert.  Take over-the-counter and prescription medicines only as told by your health care provider.  If you smoke, do not smoke without supervision.  Keep all follow-up visits as told by your health care provider. This is important. Contact a health care provider if:  You keep feeling nauseous or you keep vomiting.  You feel light-headed.  You develop a rash.  You have a fever. Get help right away if:  You have trouble breathing. This information is not intended to replace advice given to you by your health care provider. Make sure you discuss any questions you have  with your health care provider. Document Revised: 03/30/2017 Document Reviewed: 08/07/2015 Elsevier Patient Education  2020 [place]. Bone Marrow Aspiration and Bone Marrow Biopsy, Adult, Care After This sheet gives you information about how to care for yourself after your procedure. Your health care provider may also give you more specific instructions. If you have problems or questions, contact your health care provider. What can I expect after the procedure? After the procedure, it is common to have:  Mild pain and tenderness.  Swelling.  Bruising. Follow these instructions at home: Puncture site care   Follow instructions from your health care provider about how to take care of the puncture site. Make sure you: ? Wash your hands with soap and water before and after you change your bandage (dressing). If soap and water are not available, use hand sanitizer. ? Change your dressing as told by your health care provider.  Check your puncture site every day for signs of infection. Check for: ? More redness, swelling, or pain. ? Fluid or blood. ? Warmth. ? Pus or a bad smell. Activity  Return to your normal activities as told by your health care provider. Ask your health care provider what activities are safe for you.  Do not lift anything that is heavier than 10 lb (4.5 kg), or the limit that you are told, until your health care provider says that it is safe.  Do not drive for 24 hours if you were given a sedative during your procedure. General instructions   Take over-the-counter and prescription medicines only as told by your  health care provider.  Do not take baths, swim, or use a hot tub until your health care provider approves. Ask your health care provider if you may take showers. You may only be allowed to take sponge baths.  If directed, put ice on the affected area. To do this: ? Put ice in a plastic bag. ? Place a towel between your skin and the bag. ? Leave  the ice on for 20 minutes, 2-3 times a day.  Keep all follow-up visits as told by your health care provider. This is important. Contact a health care provider if:  Your pain is not controlled with medicine.  You have a fever.  You have more redness, swelling, or pain around the puncture site.  You have fluid or blood coming from the puncture site.  Your puncture site feels warm to the touch.  You have pus or a bad smell coming from the puncture site. Summary  After the procedure, it is common to have mild pain, tenderness, swelling, and bruising.  Follow instructions from your health care provider about how to take care of the puncture site and what activities are safe for you.  Take over-the-counter and prescription medicines only as told by your health care provider.  Contact a health care provider if you have any signs of infection, such as fluid or blood coming from the puncture site. This information is not intended to replace advice given to you by your health care provider. Make sure you discuss any questions you have with your health care provider. Document Revised: 09/03/2018 Document Reviewed: 09/03/2018 Elsevier Patient Education  [place].

## 2020-02-17 NOTE — Consult Note (Signed)
Chief Complaint: Lymphoma  Referring Physician(s): Yu,Zhou  Patient Status: [facility] - Out-pt  History of Present Illness: Angel Tate is a 29 y.o. female with past medical history significant for anxiety and depression and recent diagnosis of lymphoma who the presents today for CT-guided bone marrow biopsy for tissue diagnostic purposes.  Patient is currently without complaint.  Specifically, chest pain, shortness of breath, fever or chills.  No change in appetite or energy level.  No unintentional weight loss.  Past Medical History:  Diagnosis Date  . Anxiety   . Depression   . Medical history non-contributory     Past Surgical History:  Procedure Laterality Date  . DILATION AND CURETTAGE OF UTERUS    . DILATION AND EVACUATION N/A 02/17/2017   Procedure: DILATATION AND EVACUATION;  Surgeon: Janyth Pupa, DO;  Location: [facility];  Service: Gynecology;  Laterality: N/A;    Allergies: Patient has no known allergies.  Medications: Prior to Admission medications   Medication Sig Start Date End Date Taking? Authorizing Provider  acetaminophen-codeine (TYLENOL #3) 300-30 MG tablet Take 1 tablet by mouth every 8 (eight) hours as needed for moderate pain or severe pain. 02/16/20  Yes Earlie Server, MD  ibuprofen (ADVIL,MOTRIN) 200 MG tablet Take 3 tablets (600 mg total) by mouth every 6 (six) hours as needed (for pain.). 02/17/17  Yes Janyth Pupa, DO  sertraline (ZOLOFT) 100 MG tablet Take 100 mg by mouth at bedtime. Patient not taking: Reported on 02/13/2020 02/12/17   [provider]     Family History  Problem Relation Age of Onset  . Hypertension Maternal Grandmother   . Asthma Maternal Aunt   . Ovarian cancer Paternal Grandmother     Social History   Socioeconomic History  . Marital status: Married    Spouse name: [name]  . Number of children: 1  . Years of education: Not on file  . Highest education level: Not on file  Occupational History  .  Occupation: social media- marketing   Tobacco Use  . Smoking status: Never Smoker  . Smokeless tobacco: Never Used  Vaping Use  . Vaping Use: Never used  Substance and Sexual Activity  . Alcohol use: Yes    Comment: socially  . Drug use: No  . Sexual activity: Yes    Birth control/protection: None  Other Topics Concern  . Not on file  Social History Narrative  . Not on file   Social Determinants of Health   Financial Resource Strain:   . Difficulty of Paying Living Expenses: Not on file  Food Insecurity:   . Worried About Charity fundraiser in the Last Year: Not on file  . Ran Out of Food in the Last Year: Not on file  Transportation Needs:   . Lack of Transportation (Medical): Not on file  . Lack of Transportation (Non-Medical): Not on file  Physical Activity:   . Days of Exercise per Week: Not on file  . Minutes of Exercise per Session: Not on file  Stress:   . Feeling of Stress : Not on file  Social Connections:   . Frequency of Communication with Friends and Family: Not on file  . Frequency of Social Gatherings with Friends and Family: Not on file  . Attends Religious Services: Not on file  . Active Member of Clubs or Organizations: Not on file  . Attends Archivist Meetings: Not on file  . Marital Status: Not on file    ECOG Status: 0 -  Asymptomatic  Review of Systems: A 12 point ROS discussed and pertinent positives are indicated in the HPI above.  All other systems are negative.  Review of Systems  Vital Signs: BP 124/64   Pulse 86   Temp 98.3 F (36.8 C) (Oral)   Resp 20   Ht _0  (1.626 m)   Wt 69.4 kg   SpO2 98%   BMI 26.26 kg/m   Physical Exam  Imaging: MM DIAG BREAST TOMO BILATERAL  Result Date: 02/04/2020 CLINICAL DATA:  29 year old female complaining of a palpable right axillary mass. Patient states that she has palpated the abnormality since June of 2021 and more recently developed a rash in the right axilla/upper outer  quadrant of the breast. Patient has not received the COVID-19 vaccination and she has no underlying medical conditions. EXAM: DIGITAL DIAGNOSTIC BILATERAL MAMMOGRAM WITH CAD AND TOMO ULTRASOUND RIGHT BREAST COMPARISON:  None. ACR Breast Density Category c: The breast tissue is heterogeneously dense, which may obscure small masses. FINDINGS: I palpate fullness in the right axilla. There is erythema or non raised rash in the upper outer quadrant/right axilla. Targeted ultrasound shows a right axillary mass measuring 6.2 x 4.0 x 4.0 cm. There are 2 adjacent smaller axillary masses measuring 2.0 x 1.2 x 1.4 cm and 0.8 x 0.7 x 0.9 cm. Two view mammogram shows dense fibroglandular tissue. No suspicious mass, malignant type microcalcifications or distortion detected in either breast. Mammographic images were processed with CAD. IMPRESSION: Suspicious right axillary adenopathy. RECOMMENDATION: Ultrasound-guided core biopsy of the largest right axillary mass is recommended. Biopsy has been scheduled on 02/11/2020. The patient was placed on a 10 day course of doxycycline 100 mg p.o. b.i.d. for the possible rash in the upper-outer quadrant/right axilla. I have discussed the findings and recommendations with the patient. If applicable, a reminder letter will be sent to the patient regarding the next appointment. BI-RADS CATEGORY  4: Suspicious. Electronically Signed   By: Lillia Mountain M.D.   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 Core biopsy reveals sheets of large atypical lymphoid cells with frequent mitotic figures and apoptotic debris. Flow cytometry has insufficient cells for analysis. 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 This was found to be concordant by Dr. Hassan Rowan. The patient  reported doing well on 02/05/20 after the biopsy with tenderness at the site. Post biopsy instructions and care were reviewed and questions were answered. The patient was encouraged to call [facility] for any additional concerns. Results and recommendations called and faxed to [facility] LPN with [facility] and Gynecology on 02/06/20. Dr. Delice Bison NP will arrange Hematology/Oncology referral per patient request. Pathology results reported by Stacie Acres RN on 02/06/2020. Electronically Signed   By: Margarette Canada M.D.   On: 02/06/2020 12:21   Result Date: 02/06/2020 CLINICAL DATA:  29 year old female for tissue sampling of 6.2 cm RIGHT axillary lymph node/mass. EXAM: [place] AXILLARY NODE CORE BIOPSY RIGHT COMPARISON:  Previous exam(s). PROCEDURE: I met with the patient and we discussed the procedure of ultrasound-guided biopsy, including benefits and alternatives. We discussed the high likelihood of a successful procedure. We discussed the risks of the procedure, including infection, bleeding, tissue injury, clip migration, and inadequate sampling. Informed written consent was given. The usual time-out protocol was performed immediately  prior to the procedure. Using sterile technique and 1% Lidocaine as local anesthetic, under direct ultrasound visualization, a 12 gauge spring-loaded device was used to perform biopsy of the 6.2 cm RIGHT axillary lymph node/mass using a LATERAL approach. Specimens were placed in formalin and saline. At the conclusion of the procedure [facility] tissue marker clip was deployed into the biopsy cavity, with satisfactory placement confirmed sonographically. IMPRESSION: Ultrasound guided biopsy of 6.2 cm RIGHT axillary lymph node/mass. No apparent complications. Electronically Signed: By: Margarette Canada M.D. On: 02/04/2020 14:48   [place] AXILLA RIGHT  Result Date: 02/04/2020 CLINICAL DATA:  29 year old female complaining of a palpable right axillary mass. Patient  states that she has palpated the abnormality since June of 2021 and more recently developed a rash in the right axilla/upper outer quadrant of the breast. Patient has not received the COVID-19 vaccination and she has no underlying medical conditions. EXAM: DIGITAL DIAGNOSTIC BILATERAL MAMMOGRAM WITH CAD AND TOMO ULTRASOUND RIGHT BREAST COMPARISON:  None. ACR Breast Density Category c: The breast tissue is heterogeneously dense, which may obscure small masses. FINDINGS: I palpate fullness in the right axilla. There is erythema or non raised rash in the upper outer quadrant/right axilla. Targeted ultrasound shows a right axillary mass measuring 6.2 x 4.0 x 4.0 cm. There are 2 adjacent smaller axillary masses measuring 2.0 x 1.2 x 1.4 cm and 0.8 x 0.7 x 0.9 cm. Two view mammogram shows dense fibroglandular tissue. No suspicious mass, malignant type microcalcifications or distortion detected in either breast. Mammographic images were processed with CAD. IMPRESSION: Suspicious right axillary adenopathy. RECOMMENDATION: Ultrasound-guided core biopsy of the largest right axillary mass is recommended. Biopsy has been scheduled on 02/11/2020. The patient was placed on a 10 day course of doxycycline 100 mg p.o. b.i.d. for the possible rash in the upper-outer quadrant/right axilla. I have discussed the findings and recommendations with the patient. If applicable, a reminder letter will be sent to the patient regarding the next appointment. BI-RADS CATEGORY  4: Suspicious. Electronically Signed   By: Lillia Mountain M.D.   On: 02/04/2020 13:48    Labs:  CBC: Recent Labs    02/13/20 1146 02/16/20 1825 02/17/20 0803  WBC 7.2 7.5 6.7  HGB 12.4 11.7* 12.2  HCT 37.9 36.7 37.3  PLT 290 269 252    COAGS: No results for input(s): INR, APTT in the last 8760 hours.  BMP: Recent Labs    02/13/20 1146  NA 139  K 4.2  CL 103  CO2 27  GLUCOSE 95  BUN 12  CALCIUM 9.3  CREATININE 0.79  GFRNONAA >60    LIVER  FUNCTION TESTS: Recent Labs    02/13/20 1146  BILITOT 0.5  AST 16  ALT 13  ALKPHOS 65  PROT 8.0  ALBUMIN 4.5    TUMOR MARKERS: No results for input(s): AFPTM, CEA, CA199, CHROMGRNA in the last 8760 hours.  Assessment and Plan:  Angel Tate is a 29 y.o. female with past medical history significant for anxiety and depression and recent diagnosis of lymphoma who the presents today for CT-guided bone marrow biopsy for tissue diagnostic purposes.  Patient is currently without complaint.    Risks and benefits of CT-guided bone marrow biopsy and aspiration was discussed with the patient and/or patient's family including, but not limited to bleeding, infection, damage to adjacent structures or low yield requiring additional tests.  All of the questions were answered and there is agreement to proceed.  Consent signed and in chart.  Thank you for this interesting consult.  I greatly enjoyed meeting [facility] and look forward to participating in their care.  A copy of this report was sent to the requesting provider on this date.  Electronically Signed: Sandi Mariscal, MD 02/17/2020, 8:24 AM   I spent a total of 15 Minutes in face to face in clinical consultation, greater than 50% of which was counseling/coordinating care for CT-guided bone marrow biopsy and aspiration

## 2020-02-17 NOTE — Progress Notes (Signed)
Patient tolerated sandwich/apple juice without event.  No c/o's

## 2020-02-17 NOTE — Telephone Encounter (Signed)
The labs Dr. Tasia Catchings have ordered for patient was not drawn at the main lab today prior to her BMB.  Please schedule pt for lab only.  She prefers appt on Th due to work schedule.  Call patient for day/time preference of lab appt.

## 2020-02-17 NOTE — Progress Notes (Signed)
START ON PATHWAY REGIMEN - Lymphoma and CLL     A cycle is every 21 days:     Cyclophosphamide      Doxorubicin      Prednisone      Brentuximab vedotin   **Always confirm dose/schedule in your pharmacy ordering system**  Patient Characteristics: T-Cell Lymphoma, First Line, AITL (Angioimmunoblastic T-Cell Lymphoma) or ALCL (Anaplastic Large Cell Lymphoma) or Peripheral T-Cell, NOS, CD30 Positive Disease Type: Not Applicable Disease Type: T-Cell Lymphoma Disease Type: Not Applicable Line of Therapy: First Line T-Cell Lymphoma Subtype: ALCL (Anaplastic Large Cell Lymphoma) CD30 Status: CD30 Positive Intent of Therapy: Curative Intent, Discussed with Patient

## 2020-02-17 NOTE — Procedures (Signed)
Pre-procedure Diagnosis: Lymphoma Post-procedure Diagnosis: Same  Technically successful CT guided bone marrow aspiration and biopsy of left iliac crest.   Complications: None Immediate  EBL: None  Signed: Sandi Mariscal Pager: [phone] 02/17/2020, 9:17 AM

## 2020-02-18 ENCOUNTER — Encounter: Payer: Self-pay | Admitting: Oncology

## 2020-02-18 ENCOUNTER — Other Ambulatory Visit: Payer: Self-pay

## 2020-02-18 ENCOUNTER — Telehealth: Payer: Self-pay

## 2020-02-18 ENCOUNTER — Ambulatory Visit
Admission: RE | Admit: 2020-02-18 | Discharge: 2020-02-18 | Disposition: A | Discharge status: Home / Self Care | Payer: BC Managed Care – PPO | Source: Ambulatory Visit | Attending: Oncology | Admitting: Oncology

## 2020-02-18 ENCOUNTER — Telehealth (INDEPENDENT_AMBULATORY_CARE_PROVIDER_SITE_OTHER): Payer: Self-pay

## 2020-02-18 DIAGNOSIS — C8464 Anaplastic large cell lymphoma, ALK-positive, lymph nodes of axilla and upper limb: Secondary | ICD-10-CM

## 2020-02-18 DIAGNOSIS — Z0189 Encounter for other specified special examinations: Secondary | ICD-10-CM

## 2020-02-18 LAB — ECHOCARDIOGRAM LIMITED: S' Lateral: 3.31 cm

## 2020-02-18 NOTE — Telephone Encounter (Signed)
I spoke with the patient and she is scheduled with Dr. Lucky Cowboy for a port placement on 02/23/20 with a 8:30 am arrival time to the [facility]. Covid testing on 02/19/20 between 8-1 pm at the [facility]. Pre-procedure instructions were discussed and will be mailed.

## 2020-02-18 NOTE — Patient Instructions (Signed)
Doxorubicin injection What is this medicine? DOXORUBICIN (dox oh ROO bi sin) is a chemotherapy drug. It is used to treat many kinds of cancer like leukemia, lymphoma, neuroblastoma, sarcoma, and Wilms' tumor. It is also used to treat bladder cancer, breast cancer, lung cancer, ovarian cancer, stomach cancer, and thyroid cancer. This medicine may be used for other purposes; ask your health care provider or pharmacist if you have questions. COMMON BRAND NAME(S): Adriamycin, Adriamycin PFS, Adriamycin RDF, Rubex What should I tell my health care provider before I take this medicine? They need to know if you have any of these conditions:  heart disease  history of low blood counts caused by a medicine  liver disease  recent or ongoing radiation therapy  an unusual or allergic reaction to doxorubicin, other chemotherapy agents, other medicines, foods, dyes, or preservatives  pregnant or trying to get pregnant  breast-feeding How should I use this medicine? This drug is given as an infusion into a vein. It is administered in a hospital or clinic by a specially trained health care professional. If you have pain, swelling, burning or any unusual feeling around the site of your injection, tell your health care professional right away. Talk to your pediatrician regarding the use of this medicine in children. Special care may be needed. Overdosage: If you think you have taken too much of this medicine contact a poison control center or emergency room at once. NOTE: This medicine is only for you. Do not share this medicine with others. What if I miss a dose? It is important not to miss your dose. Call your doctor or health care professional if you are unable to keep an appointment. What may interact with this medicine? This medicine may interact with the following medications:  6-mercaptopurine  paclitaxel  phenytoin  St. John's Wort  trastuzumab  verapamil This list may not describe  all possible interactions. Give your health care provider a list of all the medicines, herbs, non-prescription drugs, or dietary supplements you use. Also tell them if you smoke, drink alcohol, or use illegal drugs. Some items may interact with your medicine. What should I watch for while using this medicine? This drug may make you feel generally unwell. This is not uncommon, as chemotherapy can affect healthy cells as well as cancer cells. Report any side effects. Continue your course of treatment even though you feel ill unless your doctor tells you to stop. There is a maximum amount of this medicine you should receive throughout your life. The amount depends on the medical condition being treated and your overall health. Your doctor will watch how much of this medicine you receive in your lifetime. Tell your doctor if you have taken this medicine before. You may need blood work done while you are taking this medicine. Your urine may turn red for a few days after your dose. This is not blood. If your urine is dark or brown, call your doctor. In some cases, you may be given additional medicines to help with side effects. Follow all directions for their use. Call your doctor or health care professional for advice if you get a fever, chills or sore throat, or other symptoms of a cold or flu. Do not treat yourself. This drug decreases your body's ability to fight infections. Try to avoid being around people who are sick. This medicine may increase your risk to bruise or bleed. Call your doctor or health care professional if you notice any unusual bleeding. Talk to your doctor   about your risk of cancer. You may be more at risk for certain types of cancers if you take this medicine. Do not become pregnant while taking this medicine or for 6 months after stopping it. Women should inform their doctor if they wish to become pregnant or think they might be pregnant. Men should not father a child while taking this  medicine and for 6 months after stopping it. There is a potential for serious side effects to an unborn child. Talk to your health care professional or pharmacist for more information. Do not breast-feed an infant while taking this medicine. This medicine has caused ovarian failure in some women and reduced sperm counts in some men This medicine may interfere with the ability to have a child. Talk with your doctor or health care professional if you are concerned about your fertility. This medicine may cause a decrease in Co-Enzyme Q-10. You should make sure that you get enough Co-Enzyme Q-10 while you are taking this medicine. Discuss the foods you eat and the vitamins you take with your health care professional. What side effects may I notice from receiving this medicine? Side effects that you should report to your doctor or health care professional as soon as possible:  allergic reactions like skin rash, itching or hives, swelling of the face, lips, or tongue  breathing problems  chest pain  fast or irregular heartbeat  low blood counts - this medicine may decrease the number of white blood cells, red blood cells and platelets. You may be at increased risk for infections and bleeding.  pain, redness, or irritation at site where injected  signs of infection - fever or chills, cough, sore throat, pain or difficulty passing urine  signs of decreased platelets or bleeding - bruising, pinpoint red spots on the skin, black, tarry stools, blood in the urine  swelling of the ankles, feet, hands  tiredness  weakness Side effects that usually do not require medical attention (report to your doctor or health care professional if they continue or are bothersome):  diarrhea  hair loss  mouth sores  nail discoloration or damage  nausea  red colored urine  vomiting This list may not describe all possible side effects. Call your doctor for medical advice about side effects. You may report  side effects to FDA at 1-800-FDA-1088. Where should I keep my medicine? This drug is given in a hospital or clinic and will not be stored at home. NOTE: This sheet is a summary. It may not cover all possible information. If you have questions about this medicine, talk to your doctor, pharmacist, or health care provider.  2020 Elsevier/Gold Standard (2016-11-29 11:01:26) Brentuximab vedotin solution for injection What is this medicine? BRENTUXIMAB VEDOTIN (bren TUX see mab ve DOE tin) is a monoclonal antibody and a chemotherapy drug. It is used for treating Hodgkin lymphoma and certain non-Hodgkin lymphomas, such as anaplastic large-cell lymphoma, mycosis fungoides, and peripheral T-cell lymphoma. This medicine may be used for other purposes; ask your health care provider or pharmacist if you have questions. COMMON BRAND NAME(S): ADCETRIS What should I tell my health care provider before I take this medicine? They need to know if you have any of these conditions:  immune system problems  infection (especially a virus infection such as chickenpox, cold sores, or herpes)  kidney disease  liver disease  low blood counts, like low white cell, platelet, or red cell counts  tingling of the fingers or toes, or other nerve disorder  an  unusual or allergic reaction to brentuximab vedotin, other medicines, foods, dyes, or preservatives  pregnant or trying to get pregnant  breast-feeding How should I use this medicine? This medicine is for infusion into a vein. It is given by a health care professional in a hospital or clinic setting. Talk to your pediatrician regarding the use of this medicine in children. Special care may be needed. Overdosage: If you think you have taken too much of this medicine contact a poison control center or emergency room at once. NOTE: This medicine is only for you. Do not share this medicine with others. What if I miss a dose? It is important not to miss your  dose. Call your doctor or health care professional if you are unable to keep an appointment. What may interact with this medicine? This medicine may interact with the following medications:  ketoconazole  rifampin  St. John's wort; Hypericum perforatum This list may not describe all possible interactions. Give your health care provider a list of all the medicines, herbs, non-prescription drugs, or dietary supplements you use. Also tell them if you smoke, drink alcohol, or use illegal drugs. Some items may interact with your medicine. What should I watch for while using this medicine? Visit your doctor for checks on your progress. This drug may make you feel generally unwell. Report any side effects. Continue your course of treatment even though you feel ill unless your doctor tells you to stop. Call your doctor or health care professional for advice if you get a fever, chills or sore throat, or other symptoms of a cold or flu. Do not treat yourself. This drug decreases your body's ability to fight infections. Try to avoid being around people who are sick. This medicine may increase your risk to bruise or bleed. Call your doctor or health care professional if you notice any unusual bleeding. In some patients, this medicine may cause a serious brain infection that may cause death. If you have any problems seeing, thinking, speaking, walking, or standing, tell your doctor right away. If you cannot reach your doctor, urgently seek other source of medical care. Do not become pregnant while taking this medicine or for 6 months after stopping it. Women should inform their doctor if they wish to become pregnant or think they might be pregnant. Men should not father a child while taking this medicine and for 6 months after stopping it. There is a potential for serious side effects to an unborn child. Talk to your health care professional or pharmacist for more information. Do not breast-feed an infant while  taking this medicine. This may interfere with the ability to father a child. You should talk to your doctor or health care professional if you are concerned about your fertility. What side effects may I notice from receiving this medicine? Side effects that you should report to your doctor or health care professional as soon as possible:  allergic reactions like skin rash, itching or hives, swelling of the face, lips, or tongue  changes in emotions or moods  diarrhea  low blood counts - this medicine may decrease the number of white blood cells, red blood cells and platelets. You may be at increased risk for infections and bleeding.  pain, tingling, numbness in the hands or feet  redness, blistering, peeling or loosening of the skin, including inside the mouth  shortness of breath  signs of infection - fever or chills, cough, sore throat, pain or difficulty passing urine  signs of decreased  platelets or bleeding - bruising, pinpoint red spots on the skin, black, tarry stools, blood in the urine  signs of decreased red blood cells - unusually weak or tired, fainting spells, lightheadedness  signs of liver injury like dark yellow or brown urine; general ill feeling or flu-like symptoms; light-colored stools; loss of appetite; nausea; right upper belly pain; yellowing of the eyes or skin  stomach pain  sudden numbness or weakness of the face, arm or leg  vomiting Side effects that usually do not require medical attention (report to your doctor or health care professional if they continue or are bothersome):  constipation  dizziness  headache  muscle pain  tiredness This list may not describe all possible side effects. Call your doctor for medical advice about side effects. You may report side effects to FDA at 1-800-FDA-1088. Where should I keep my medicine? This drug is given in a hospital or clinic and will not be stored at home. NOTE: This sheet is a summary. It may not  cover all possible information. If you have questions about this medicine, talk to your doctor, pharmacist, or health care provider.  2020 Elsevier/Gold Standard (2017-03-19 14:10:02) Cyclophosphamide Injection What is this medicine? CYCLOPHOSPHAMIDE (sye kloe FOSS fa mide) is a chemotherapy drug. It slows the growth of cancer cells. This medicine is used to treat many types of cancer like lymphoma, myeloma, leukemia, breast cancer, and ovarian cancer, to name a few. This medicine may be used for other purposes; ask your health care provider or pharmacist if you have questions. COMMON BRAND NAME(S): Cytoxan, Neosar What should I tell my health care provider before I take this medicine? They need to know if you have any of these conditions:  heart disease  history of irregular heartbeat  infection  kidney disease  liver disease  low blood counts, like white cells, platelets, or red blood cells  on hemodialysis  recent or ongoing radiation therapy  scarring or thickening of the lungs  trouble passing urine  an unusual or allergic reaction to cyclophosphamide, other medicines, foods, dyes, or preservatives  pregnant or trying to get pregnant  breast-feeding How should I use this medicine? This drug is usually given as an injection into a vein or muscle or by infusion into a vein. It is administered in a hospital or clinic by a specially trained health care professional. Talk to your pediatrician regarding the use of this medicine in children. Special care may be needed. Overdosage: If you think you have taken too much of this medicine contact a poison control center or emergency room at once. NOTE: This medicine is only for you. Do not share this medicine with others. What if I miss a dose? It is important not to miss your dose. Call your doctor or health care professional if you are unable to keep an appointment. What may interact with this medicine?  amphotericin  B  azathioprine  certain antivirals for HIV or hepatitis  certain medicines for blood pressure, heart disease, irregular heart beat  certain medicines that treat or prevent blood clots like warfarin  certain other medicines for cancer  cyclosporine  etanercept  indomethacin  medicines that relax muscles for surgery  medicines to increase blood counts  metronidazole This list may not describe all possible interactions. Give your health care provider a list of all the medicines, herbs, non-prescription drugs, or dietary supplements you use. Also tell them if you smoke, drink alcohol, or use illegal drugs. Some items may interact with  your medicine. What should I watch for while using this medicine? Your condition will be monitored carefully while you are receiving this medicine. You may need blood work done while you are taking this medicine. Drink water or other fluids as directed. Urinate often, even at night. Some products may contain alcohol. Ask your health care professional if this medicine contains alcohol. Be sure to tell all health care professionals you are taking this medicine. Certain medicines, like metronidazole and disulfiram, can cause an unpleasant reaction when taken with alcohol. The reaction includes flushing, headache, nausea, vomiting, sweating, and increased thirst. The reaction can last from 30 minutes to several hours. Do not become pregnant while taking this medicine or for 1 year after stopping it. Women should inform their health care professional if they wish to become pregnant or think they might be pregnant. Men should not father a child while taking this medicine and for 4 months after stopping it. There is potential for serious side effects to an unborn child. Talk to your health care professional for more information. Do not breast-feed an infant while taking this medicine or for 1 week after stopping it. This medicine has caused ovarian failure in some  women. This medicine may make it more difficult to get pregnant. Talk to your health care professional if you are concerned about your fertility. This medicine has caused decreased sperm counts in some men. This may make it more difficult to father a child. Talk to your health care professional if you are concerned about your fertility. Call your health care professional for advice if you get a fever, chills, or sore throat, or other symptoms of a cold or flu. Do not treat yourself. This medicine decreases your body's ability to fight infections. Try to avoid being around people who are sick. Avoid taking medicines that contain aspirin, acetaminophen, ibuprofen, naproxen, or ketoprofen unless instructed by your health care professional. These medicines may hide a fever. Talk to your health care professional about your risk of cancer. You may be more at risk for certain types of cancer if you take this medicine. If you are going to need surgery or other procedure, tell your health care professional that you are using this medicine. Be careful brushing or flossing your teeth or using a toothpick because you may get an infection or bleed more easily. If you have any dental work done, tell your dentist you are receiving this medicine. What side effects may I notice from receiving this medicine? Side effects that you should report to your doctor or health care professional as soon as possible:  allergic reactions like skin rash, itching or hives, swelling of the face, lips, or tongue  breathing problems  nausea, vomiting  signs and symptoms of bleeding such as bloody or black, tarry stools; red or dark brown urine; spitting up blood or brown material that looks like coffee grounds; red spots on the skin; unusual bruising or bleeding from the eyes, gums, or nose  signs and symptoms of heart failure like fast, irregular heartbeat, sudden weight gain; swelling of the ankles, feet, hands  signs and  symptoms of infection like fever; chills; cough; sore throat; pain or trouble passing urine  signs and symptoms of kidney injury like trouble passing urine or change in the amount of urine  signs and symptoms of liver injury like dark yellow or brown urine; general ill feeling or flu-like symptoms; light-colored stools; loss of appetite; nausea; right upper belly pain; unusually weak or tired;  yellowing of the eyes or skin Side effects that usually do not require medical attention (report to your doctor or health care professional if they continue or are bothersome):  confusion  decreased hearing  diarrhea  facial flushing  hair loss  headache  loss of appetite  missed menstrual periods  signs and symptoms of low red blood cells or anemia such as unusually weak or tired; feeling faint or lightheaded; falls  skin discoloration This list may not describe all possible side effects. Call your doctor for medical advice about side effects. You may report side effects to FDA at 1-800-FDA-1088. Where should I keep my medicine? This drug is given in a hospital or clinic and will not be stored at home. NOTE: This sheet is a summary. It may not cover all possible information. If you have questions about this medicine, talk to your doctor, pharmacist, or health care provider.  2020 Elsevier/Gold Standard (2019-01-20 09:53:29)

## 2020-02-18 NOTE — Telephone Encounter (Signed)
-----   Message from Earlie Server, MD sent at 02/17/2020 10:29 PM EDT ----- - she had labs done at main lab, but only urine HCG, CBC, rest of the labs were not done. Please rearrange, on a day that she needs to come anyway.  - please refer her to vascular surgeon for medi port placement.  - Chemo therapy class BV +CHP, plan 4- 6  cycles.  - let her know that I sent her emla cream, nausea medication and allopurinol. I recommend her to start taking allopurinol for tumor lysis prophylaxis. No need to take other nausea medications prednisone until she start chemotherapy and I will further discuss w her on first day of chemo.  -plan lab md BV+ CHP Day 3 Fulphilia after she had chemo class, PET and Echo Thanks.

## 2020-02-18 NOTE — Progress Notes (Signed)
*  PRELIMINARY RESULTS* Echocardiogram 2D Echocardiogram has been performed.  Angel Tate 02/18/2020, 11:36 AM

## 2020-02-18 NOTE — Telephone Encounter (Signed)
Done.. Pt is aware of her schedule *NEW* Chemo class BV +CHP on 10/21 @ 9am

## 2020-02-18 NOTE — Telephone Encounter (Signed)
Patient updated with Dr. Collie Siad plan.  Please schedule chemo class BV +CHP and contact patient with appt detail.

## 2020-02-18 NOTE — Telephone Encounter (Signed)
Referral entered for port placement

## 2020-02-18 NOTE — Telephone Encounter (Signed)
Left message for patient to call for an update on plan.

## 2020-02-19 ENCOUNTER — Inpatient Hospital Stay (HOSPITAL_BASED_OUTPATIENT_CLINIC_OR_DEPARTMENT_OTHER): Payer: BC Managed Care – PPO | Admitting: Nurse Practitioner

## 2020-02-19 ENCOUNTER — Inpatient Hospital Stay: Payer: BC Managed Care – PPO

## 2020-02-19 ENCOUNTER — Other Ambulatory Visit
Admission: RE | Admit: 2020-02-19 | Discharge: 2020-02-19 | Disposition: A | Discharge status: Home / Self Care | Payer: BC Managed Care – PPO | Source: Ambulatory Visit | Attending: Vascular Surgery | Admitting: Vascular Surgery

## 2020-02-19 DIAGNOSIS — Z7189 Other specified counseling: Secondary | ICD-10-CM

## 2020-02-19 DIAGNOSIS — C8464 Anaplastic large cell lymphoma, ALK-positive, lymph nodes of axilla and upper limb: Secondary | ICD-10-CM

## 2020-02-19 DIAGNOSIS — Z01812 Encounter for preprocedural laboratory examination: Secondary | ICD-10-CM | POA: Diagnosis not present

## 2020-02-19 DIAGNOSIS — Z20822 Contact with and (suspected) exposure to covid-19: Secondary | ICD-10-CM | POA: Insufficient documentation

## 2020-02-19 DIAGNOSIS — Z0000 Encounter for general adult medical examination without abnormal findings: Secondary | ICD-10-CM

## 2020-02-19 LAB — CBC WITH DIFFERENTIAL/PLATELET
Abs Immature Granulocytes: 0.02 10*3/uL (ref 0.00–0.07)
Basophils Absolute: 0 10*3/uL (ref 0.0–0.1)
Basophils Relative: 0 %
Eosinophils Absolute: 0.1 10*3/uL (ref 0.0–0.5)
Eosinophils Relative: 1 %
HCT: 38.8 % (ref 36.0–46.0)
Hemoglobin: 12.7 g/dL (ref 12.0–15.0)
Immature Granulocytes: 0 %
Lymphocytes Relative: 22 %
Lymphs Abs: 2.1 10*3/uL (ref 0.7–4.0)
MCH: 29.5 pg (ref 26.0–34.0)
MCHC: 32.7 g/dL (ref 30.0–36.0)
MCV: 90 fL (ref 80.0–100.0)
Monocytes Absolute: 0.5 10*3/uL (ref 0.1–1.0)
Monocytes Relative: 6 %
Neutro Abs: 6.7 10*3/uL (ref 1.7–7.7)
Neutrophils Relative %: 71 %
Platelets: 278 10*3/uL (ref 150–400)
RBC: 4.31 MIL/uL (ref 3.87–5.11)
RDW: 13.2 % (ref 11.5–15.5)
WBC: 9.4 10*3/uL (ref 4.0–10.5)
nRBC: 0 % (ref 0.0–0.2)

## 2020-02-19 LAB — PATHOLOGIST SMEAR REVIEW

## 2020-02-19 LAB — SARS CORONAVIRUS 2 (TAT 6-24 HRS): SARS Coronavirus 2: NEGATIVE

## 2020-02-20 ENCOUNTER — Telehealth: Payer: Self-pay | Admitting: *Deleted

## 2020-02-20 LAB — HTLV I+II ANTIBODIES, (EIA), BLD: HTLV I/II Ab: NEGATIVE

## 2020-02-20 NOTE — Telephone Encounter (Signed)
FYI

## 2020-02-20 NOTE — Telephone Encounter (Signed)
Per Margreta Journey 02/20/20 staff message cx 02/24/20  PET scan due to PET being denied. Urgent appeal filed. Awaiting determination. Called pt and made her aware.

## 2020-02-22 ENCOUNTER — Other Ambulatory Visit (INDEPENDENT_AMBULATORY_CARE_PROVIDER_SITE_OTHER): Payer: Self-pay | Admitting: Nurse Practitioner

## 2020-02-23 ENCOUNTER — Encounter
Admission: RE | Disposition: A | Discharge status: Home / Self Care | Payer: Self-pay | Source: Home / Self Care | Attending: Vascular Surgery

## 2020-02-23 ENCOUNTER — Encounter: Payer: Self-pay | Admitting: Vascular Surgery

## 2020-02-23 ENCOUNTER — Other Ambulatory Visit: Payer: Self-pay

## 2020-02-23 ENCOUNTER — Ambulatory Visit
Admission: RE | Admit: 2020-02-23 | Discharge: 2020-02-23 | Disposition: A | Discharge status: Home / Self Care | Payer: BC Managed Care – PPO | Attending: Vascular Surgery | Admitting: Vascular Surgery

## 2020-02-23 DIAGNOSIS — F329 Major depressive disorder, single episode, unspecified: Secondary | ICD-10-CM | POA: Diagnosis not present

## 2020-02-23 DIAGNOSIS — C8460 Anaplastic large cell lymphoma, ALK-positive, unspecified site: Secondary | ICD-10-CM | POA: Insufficient documentation

## 2020-02-23 DIAGNOSIS — F419 Anxiety disorder, unspecified: Secondary | ICD-10-CM | POA: Diagnosis not present

## 2020-02-23 DIAGNOSIS — C8584 Other specified types of non-Hodgkin lymphoma, lymph nodes of axilla and upper limb: Secondary | ICD-10-CM

## 2020-02-23 HISTORY — PX: PORTA CATH INSERTION: CATH118285

## 2020-02-23 LAB — COMP PANEL: LEUKEMIA/LYMPHOMA

## 2020-02-23 LAB — EPSTEIN BARR VRS(EBV DNA BY PCR)
EBV DNA QN by PCR: NEGATIVE copies/mL
log10 EBV DNA Qn PCR: UNDETERMINED log10 copy/mL

## 2020-02-23 SURGERY — PORTA CATH INSERTION
Anesthesia: Moderate Sedation

## 2020-02-23 MED ORDER — CHLORHEXIDINE GLUCONATE CLOTH 2 % EX PADS
6.0000 | MEDICATED_PAD | Freq: Every day | CUTANEOUS | Status: DC
  Administered 2020-02-23: 6 via TOPICAL

## 2020-02-23 MED ORDER — CEFAZOLIN SODIUM-DEXTROSE 2-4 GM/100ML-% IV SOLN
INTRAVENOUS | Status: AC
  Administered 2020-02-23: 2 g via INTRAVENOUS
  Filled 2020-02-23: qty 100

## 2020-02-23 MED ORDER — MIDAZOLAM HCL 2 MG/ML PO SYRP: 8.0000 mg | ORAL_SOLUTION | Freq: Once | ORAL | Status: DC | PRN

## 2020-02-23 MED ORDER — FAMOTIDINE 20 MG PO TABS: 40.0000 mg | ORAL_TABLET | Freq: Once | ORAL | Status: DC | PRN

## 2020-02-23 MED ORDER — MIDAZOLAM HCL 5 MG/5ML IJ SOLN
INTRAMUSCULAR | Status: AC
  Filled 2020-02-23: qty 5

## 2020-02-23 MED ORDER — DIPHENHYDRAMINE HCL 50 MG/ML IJ SOLN: 50.0000 mg | Freq: Once | INTRAMUSCULAR | Status: DC | PRN

## 2020-02-23 MED ORDER — MIDAZOLAM HCL 2 MG/2ML IJ SOLN
INTRAMUSCULAR | Status: DC | PRN
  Administered 2020-02-23: 2 mg via INTRAVENOUS

## 2020-02-23 MED ORDER — SODIUM CHLORIDE 0.9 % IV SOLN
Freq: Once | INTRAVENOUS | Status: DC
  Filled 2020-02-23: qty 2

## 2020-02-23 MED ORDER — FENTANYL CITRATE (PF) 100 MCG/2ML IJ SOLN
INTRAMUSCULAR | Status: DC | PRN
Start: 2020-02-23 — End: 2020-02-23
  Administered 2020-02-23: 50 ug via INTRAVENOUS

## 2020-02-23 MED ORDER — METHYLPREDNISOLONE SODIUM SUCC 125 MG IJ SOLR: 125.0000 mg | Freq: Once | INTRAMUSCULAR | Status: DC | PRN

## 2020-02-23 MED ORDER — HYDROMORPHONE HCL 1 MG/ML IJ SOLN: 1.0000 mg | Freq: Once | INTRAMUSCULAR | Status: DC | PRN

## 2020-02-23 MED ORDER — FENTANYL CITRATE (PF) 100 MCG/2ML IJ SOLN
INTRAMUSCULAR | Status: AC
  Filled 2020-02-23: qty 2

## 2020-02-23 MED ORDER — ONDANSETRON HCL 4 MG/2ML IJ SOLN: 4.0000 mg | Freq: Four times a day (QID) | INTRAMUSCULAR | Status: DC | PRN

## 2020-02-23 MED ORDER — SODIUM CHLORIDE 0.9 % IV SOLN: INTRAVENOUS | Status: DC

## 2020-02-23 MED ORDER — CEFAZOLIN SODIUM-DEXTROSE 2-4 GM/100ML-% IV SOLN: 2.0000 g | Freq: Once | INTRAVENOUS | Status: AC

## 2020-02-23 SURGICAL SUPPLY — 11 items
DERMABOND ADVANCED (GAUZE/BANDAGES/DRESSINGS) ×1
DERMABOND ADVANCED .7 DNX12 (GAUZE/BANDAGES/DRESSINGS) ×1 IMPLANT
ELECT REM PT RETURN 9FT ADLT (ELECTROSURGICAL) ×2
ELECTRODE REM PT RTRN 9FT ADLT (ELECTROSURGICAL) ×1 IMPLANT
HANDLE YANKAUER SUCT BULB TIP (MISCELLANEOUS) ×2 IMPLANT
KIT PORT POWER 8FR ISP CVUE (Port) ×2 IMPLANT
PACK ANGIOGRAPHY (CUSTOM PROCEDURE TRAY) ×2 IMPLANT
SPONGE XRAY 4X4 16PLY STRL (MISCELLANEOUS) ×2 IMPLANT
SUT MNCRL AB 4-0 PS2 18 (SUTURE) ×2 IMPLANT
SUT VIC AB 3-0 SH 27 (SUTURE) ×1
SUT VIC AB 3-0 SH 27X BRD (SUTURE) ×1 IMPLANT

## 2020-02-23 NOTE — Progress Notes (Signed)
Pt d/c home via personal vehicle with pt mother.  Pt alert/oriented, port site c/d/i, VSS at time of discharge.  Port information and d/c instructions reviewed.

## 2020-02-23 NOTE — Interval H&P Note (Signed)
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  The various methods of treatment have been discussed with the patient and family. After consideration of risks, benefits and other options for treatment, the patient has consented to  Procedure(s): PORTA CATH INSERTION (N/A) as a surgical intervention.  The patient's history has been reviewed, patient examined, no change in status, stable for surgery.  I have reviewed the patient's chart and labs.  Questions were answered to the patient's satisfaction.     [name]

## 2020-02-23 NOTE — Op Note (Signed)
      [facility]       Operative Note  Date: 02/23/2020  Preoperative diagnosis:  1. Anaplastic lymphoma  Postoperative diagnosis:  Same as above  Procedures: #1. Ultrasound guidance for vascular access to the right internal jugular vein. #2. Fluoroscopic guidance for placement of catheter. #3. Placement of CT compatible Port-A-Cath, right internal jugular vein.  Surgeon: [name], MD.   Anesthesia: Local with moderate conscious sedation for approximately 22  minutes using 2 mg of Versed and 50 mcg of Fentanyl  Fluoroscopy time: less than 1 minute  Contrast used: 0  Estimated blood loss: 3 cc  Indication for the procedure:  The patient is a 29 y.o.female with lymphoma.  The patient needs a Port-A-Cath for durable venous access, chemotherapy, lab draws, and CT scans. We are asked to place this. Risks and benefits were discussed and informed consent was obtained.  Description of procedure: The patient was brought to the vascular and interventional radiology suite.  Moderate conscious sedation was administered throughout the procedure during a face to face encounter with the patient with my supervision of the RN administering medicines and monitoring the patient's vital signs, pulse oximetry, telemetry and mental status throughout from the start of the procedure until the patient was taken to the recovery room. The right neck chest and shoulder were sterilely prepped and draped, and a sterile surgical field was created. Ultrasound was used to help visualize a patent right internal jugular vein. This was then accessed under direct ultrasound guidance without difficulty with the Seldinger needle and a permanent image was recorded. A J-wire was placed. After skin nick and dilatation, the peel-away sheath was then placed over the wire. I then anesthetized an area under the clavicle approximately 1-2 fingerbreadths. A transverse incision was created and an inferior pocket  was created with electrocautery and blunt dissection. The port was then brought onto the field, placed into the pocket and secured to the chest wall with 2 Prolene sutures. The catheter was connected to the port and tunneled from the subclavicular incision to the access site. Fluoroscopic guidance was then used to cut the catheter to an appropriate length. The catheter was then placed through the peel-away sheath and the peel-away sheath was removed. The catheter tip was parked in excellent location under fluorocoscopic guidance in the cavoatrial junction. The pocket was then irrigated with antibiotic impregnated saline and the wound was closed with a running 3-0 Vicryl and a 4-0 Monocryl. The access incision was closed with a single 4-0 Monocryl. The Huber needle was used to withdraw blood and flush the port with heparinized saline. Dermabond was then placed as a dressing. The patient tolerated the procedure well and was taken to the recovery room in stable condition.   [name] 02/23/2020 10:30 AM   This note was created with Dragon Medical transcription system. Any errors in dictation are purely unintentional.

## 2020-02-24 ENCOUNTER — Encounter (HOSPITAL_COMMUNITY): Payer: Self-pay | Admitting: Oncology

## 2020-02-24 ENCOUNTER — Ambulatory Visit: Payer: BC Managed Care – PPO

## 2020-02-24 ENCOUNTER — Encounter: Payer: Self-pay | Admitting: Nurse Practitioner

## 2020-02-24 NOTE — Telephone Encounter (Addendum)
Update from Tonita [place]., authorization dept:  It's in [place]. There's nothing else I can do until they make their determination.

## 2020-02-24 NOTE — Progress Notes (Signed)
Virtual Visit Progress Note  [facility] NOTE [facility]  Telephone:([phone] [phone]  Patient Care Team: Patient, No Pcp Per as PCP - General (General Practice)   Name of the patient: Angel Tate  [id number]  05-03-1990   Date of visit: 02/24/20  I connected with Oletta Lamas on 02/24/20 at 11:30 AM EDT by telephone visit and verified that I am speaking with the correct person using two identifiers.   I discussed the limitations, risks, security and privacy concerns of performing an evaluation and management service by telemedicine and the availability of in-person appointments. I also discussed with the patient that there may be a patient responsible charge related to this service. The patient expressed understanding and agreed to proceed.   Other persons participating in the visit and their role in the encounter: Magdalene Patricia, Therapist, sports (Nurse Navigator & Chemo Education)  Patient's location: home Provider's location: clinic  [place]  Chief complaint/Reason for visit- Initial Meeting for [facility], preparing for starting chemotherapy  Heme/Onc history:  Angel Tate is a  29 y.o.  female with PMH listed below was seen in consultation at the request of  Thongteum, Auma N, NP  for evaluation of lymphoma Patient reports feeling right axillary mass in June 2021.  Initially the mass did not bother her.  Patient developed a rash in the right axillary/upper outer quadrant of the breast and had additional work-up.  Patient was treated with a 10-day course of doxycycline for possible infection.  Did not improve. 02/04/2020, targeted ultrasound showed right axillary mass measuring 6.2 x 4 by 4 cm.  There are 2 adjacent smaller axillary mass measuring 2.0 x 1.2 x 1.4 cm and 0.8 x 0.7 x 0.9 cm.  Mammogram showed dense fibroglandular.  No suspicious mass or malignant type microcalcifications or distortion detected  in either breast. Patient underwent ultrasound-guided core biopsy of the right axillary mass. 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  Ki-67 80-90% Flow cytometry has insufficient cells for analysis.  Patient met with Dr. Tasia Catchings who recommended bone marrow biopsy.  Advised the goal of treatment would be curative intent and that ALCL is a type of aggressive lymphoma and mainstay of first-line treatment is systemic chemotherapy.  She recommended BV + CHP.  ECOG- 1.  Has some night sweats.  Underwent CBC, LDH, CMP, uric acid. Urine pregnancy was negative.  PET scan was ordered but awaiting insurance authorization.   She is married and has 2 children.  She was not desiring future fertility.  She has an IUD/Skyla that was placed by Dr. Freada Bergeron Ob-Gyn in November 2020 after her second child was born.   She had a port placed for administration of chemotherapy on 02/23/2020.  Interval history-  Angel Tate, 29 year old female patient, who presents to chemo care clinic today for initial meeting in preparation for starting chemotherapy. I introduced the chemo care clinic and we discussed that the role of the clinic is to assist those who are at an increased risk of emergency room visits and/or complications during the course of chemotherapy treatment. We discussed that the increased risk takes into account factors such as age, performance status, and co-morbidities. We also discussed that for some, this might include barriers to care such as not having a primary care provider, lack of insurance/transportation, or not being able to afford medications. We discussed that the goal of the program is to help prevent unplanned ER visits and help  reduce complications during chemotherapy. We do this by discussing specific risk factors to each individual and identifying ways that we can help improve these risk factors and reduce barriers to care.  She had a port placed for  administration of chemotherapy yesterday.  Experiencing mild pain.  Has been taking Tylenol 3 as prescribed.  PET scan for staging has not yet been approved by insurance.  ECOG FS:1 - Symptomatic but completely ambulatory  Review of systems- Review of Systems  Constitutional: Negative for chills, fever, malaise/fatigue and weight loss.  HENT: Negative for hearing loss, nosebleeds, sore throat and tinnitus.   Eyes: Negative for blurred vision and double vision.  Respiratory: Negative for cough, hemoptysis, shortness of breath and wheezing.   Cardiovascular: Negative for chest pain, palpitations and leg swelling.  Gastrointestinal: Negative for abdominal pain, blood in stool, constipation, diarrhea, melena, nausea and vomiting.  Genitourinary: Negative for dysuria and urgency.  Musculoskeletal: Negative for back pain, falls, joint pain and myalgias.  Skin: Negative for itching and rash.  Neurological: Negative for dizziness, tingling, sensory change, loss of consciousness, weakness and headaches.  Endo/Heme/Allergies: Negative for environmental allergies. Does not bruise/bleed easily.  Psychiatric/Behavioral: Negative for depression. The patient is not nervous/anxious and does not have insomnia.        No Known Allergies  Past Medical History:  Diagnosis Date  . Anxiety   . Depression   . Medical history non-contributory     Past Surgical History:  Procedure Laterality Date  . DILATION AND CURETTAGE OF UTERUS    . DILATION AND EVACUATION N/A 02/17/2017   Procedure: DILATATION AND EVACUATION;  Surgeon: Janyth Pupa, DO;  Location: [place] [facility];  Service: Gynecology;  Laterality: N/A;  . PORTA CATH INSERTION N/A 02/23/2020   Procedure: PORTA CATH INSERTION;  Surgeon: Algernon Huxley, MD;  Location: [place] [facility];  Service: Cardiovascular;  Laterality: N/A;    Social History   Socioeconomic History  . Marital status: Married    Spouse name: [name]  . Number of children: 1  .  Years of education: Not on file  . Highest education level: Not on file  Occupational History  . Occupation: social media- marketing   Tobacco Use  . Smoking status: Never Smoker  . Smokeless tobacco: Never Used  Vaping Use  . Vaping Use: Never used  Substance and Sexual Activity  . Alcohol use: Yes    Comment: socially  . Drug use: No  . Sexual activity: Yes    Birth control/protection: None  Other Topics Concern  . Not on file  Social History Narrative  . Not on file   Social Determinants of Health   Financial Resource Strain:   . Difficulty of Paying Living Expenses: Not on file  Food Insecurity:   . Worried About Charity fundraiser in the Last Year: Not on file  . Ran Out of Food in the Last Year: Not on file  Transportation Needs:   . Lack of Transportation (Medical): Not on file  . Lack of Transportation (Non-Medical): Not on file  Physical Activity:   . Days of Exercise per Week: Not on file  . Minutes of Exercise per Session: Not on file  Stress:   . Feeling of Stress : Not on file  Social Connections:   . Frequency of Communication with Friends and Family: Not on file  . Frequency of Social Gatherings with Friends and Family: Not on file  . Attends Religious Services: Not on file  .  Active Member of Clubs or Organizations: Not on file  . Attends Archivist Meetings: Not on file  . Marital Status: Not on file  Intimate Partner Violence:   . Fear of Current or Ex-Partner: Not on file  . Emotionally Abused: Not on file  . Physically Abused: Not on file  . Sexually Abused: Not on file    Family History  Problem Relation Age of Onset  . Hypertension Maternal Grandmother   . Asthma Maternal Aunt   . Ovarian cancer Paternal Grandmother      Current Outpatient Medications:  .  acetaminophen-codeine (TYLENOL #3) 300-30 MG tablet, Take 1 tablet by mouth every 8 (eight) hours as needed for moderate pain or severe pain., Disp: 30 tablet, Rfl: 0 .   allopurinol (ZYLOPRIM) 300 MG tablet, Take 1 tablet (300 mg total) by mouth daily., Disp: 30 tablet, Rfl: 3 .  ibuprofen (ADVIL,MOTRIN) 200 MG tablet, Take 3 tablets (600 mg total) by mouth every 6 (six) hours as needed (for pain.)., Disp: 30 tablet, Rfl: 0 .  lidocaine-prilocaine (EMLA) cream, Apply to affected area once (Patient not taking: Reported on 02/23/2020), Disp: 30 g, Rfl: 3 .  ondansetron (ZOFRAN) 8 MG tablet, Take 1 tablet (8 mg total) by mouth 2 (two) times daily as needed for refractory nausea / vomiting. Start on day 3 after cyclophosphamide chemotherapy. (Patient not taking: Reported on 02/23/2020), Disp: 30 tablet, Rfl: 1 .  predniSONE (DELTASONE) 20 MG tablet, Take 5 tablets (100 mg total) by mouth daily. Take on days 2-5 of chemotherapy. (Patient not taking: Reported on 02/23/2020), Disp: 20 tablet, Rfl: 7 .  prochlorperazine (COMPAZINE) 10 MG tablet, Take 1 tablet (10 mg total) by mouth every 6 (six) hours as needed (Nausea or vomiting). (Patient not taking: Reported on 02/23/2020), Disp: 30 tablet, Rfl: 6 .  sertraline (ZOLOFT) 100 MG tablet, Take 100 mg by mouth at bedtime. (Patient not taking: Reported on 02/13/2020), Disp: , Rfl: 11  Physical exam: Exam limited due to telemedicine  CMP Latest Ref Rng & Units 02/13/2020  Glucose 70 - 99 mg/dL 95  BUN 6 - 20 mg/dL 12  Creatinine 0.44 - 1.00 mg/dL 0.79  Sodium 135 - 145 mmol/L 139  Potassium 3.5 - 5.1 mmol/L 4.2  Chloride 98 - 111 mmol/L 103  CO2 22 - 32 mmol/L 27  Calcium 8.9 - 10.3 mg/dL 9.3  Total Protein 6.5 - 8.1 g/dL 8.0  Total Bilirubin 0.3 - 1.2 mg/dL 0.5  Alkaline Phos 38 - 126 U/L 65  AST 15 - 41 U/L 16  ALT 0 - 44 U/L 13   CBC Latest Ref Rng & Units 02/19/2020  WBC 4.0 - 10.5 K/uL 9.4  Hemoglobin 12.0 - 15.0 g/dL 12.7  Hematocrit 36 - 46 % 38.8  Platelets 150 - 400 K/uL 278    No images are attached to the encounter.  PERIPHERAL VASCULAR CATHETERIZATION  Result Date: 02/23/2020 See op note  CT  BONE MARROW BIOPSY & ASPIRATION  Result Date: 02/17/2020 INDICATION: Recent diagnosis of lymphoma. Please perform CT-guided bone marrow biopsy for tissue diagnostic purposes. EXAM: CT-GUIDED BONE MARROW BIOPSY AND ASPIRATION MEDICATIONS: None ANESTHESIA/SEDATION: Fentanyl 2 mcg IV; Versed 100 mg IV Sedation Time: 10 Minutes; The patient was continuously monitored during the procedure by the interventional radiology nurse under my direct supervision. COMPLICATIONS: None immediate. PROCEDURE: Informed consent was obtained from the patient following an explanation of the procedure, risks, benefits and alternatives. The patient understands, agrees and consents for the  procedure. All questions were addressed. A time out was performed prior to the initiation of the procedure. The patient was positioned prone and non-contrast localization CT was performed of the pelvis to demonstrate the iliac marrow spaces. The operative site was prepped and draped in the usual sterile fashion. Under sterile conditions and local anesthesia, a 22 gauge spinal needle was utilized for procedural planning. Next, an 11 gauge coaxial bone biopsy needle was advanced into the left iliac marrow space. Needle position was confirmed with CT imaging. Initially, a bone marrow aspiration was performed. Next, a bone marrow biopsy was obtained with the 11 gauge outer bone marrow device. The 11 gauge coaxial bone biopsy needle was re-advanced into a slightly different location within the left iliac marrow space, positioning was confirmed with CT imaging and an additional bone marrow biopsy was obtained. The needle was removed and superficial hemostasis was obtained with manual compression. A dressing was applied. The patient tolerated the procedure well without immediate post procedural complication. IMPRESSION: Successful CT guided left iliac bone marrow aspiration and core biopsy. Electronically Signed   By: Sandi Mariscal M.D.   On: 02/17/2020 11:01    MM DIAG BREAST TOMO BILATERAL  Result Date: 02/04/2020 CLINICAL DATA:  29 year old female complaining of a palpable right axillary mass. Patient states that she has palpated the abnormality since June of 2021 and more recently developed a rash in the right axilla/upper outer quadrant of the breast. Patient has not received the COVID-19 vaccination and she has no underlying medical conditions. EXAM: DIGITAL DIAGNOSTIC BILATERAL MAMMOGRAM WITH CAD AND TOMO ULTRASOUND RIGHT BREAST COMPARISON:  None. ACR Breast Density Category c: The breast tissue is heterogeneously dense, which may obscure small masses. FINDINGS: I palpate fullness in the right axilla. There is erythema or non raised rash in the upper outer quadrant/right axilla. Targeted ultrasound shows a right axillary mass measuring 6.2 x 4.0 x 4.0 cm. There are 2 adjacent smaller axillary masses measuring 2.0 x 1.2 x 1.4 cm and 0.8 x 0.7 x 0.9 cm. Two view mammogram shows dense fibroglandular tissue. No suspicious mass, malignant type microcalcifications or distortion detected in either breast. Mammographic images were processed with CAD. IMPRESSION: Suspicious right axillary adenopathy. RECOMMENDATION: Ultrasound-guided core biopsy of the largest right axillary mass is recommended. Biopsy has been scheduled on 02/11/2020. The patient was placed on a 10 day course of doxycycline 100 mg p.o. b.i.d. for the possible rash in the upper-outer quadrant/right axilla. I have discussed the findings and recommendations with the patient. If applicable, a reminder letter will be sent to the patient regarding the next appointment. BI-RADS CATEGORY  4: Suspicious. Electronically Signed   By: Lillia Mountain M.D.   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 Core biopsy reveals sheets of  large atypical lymphoid cells with frequent mitotic figures and apoptotic debris. Flow cytometry has insufficient cells for analysis. 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 This was found to be concordant by Dr. Hassan Rowan. The patient reported doing well on 02/05/20 after the biopsy with tenderness at the site. Post biopsy instructions and care were reviewed and questions were answered. The patient was encouraged to call [facility] for any additional concerns. Results and recommendations called and faxed to [facility] LPN with [facility] on  02/06/20. Dr. Delice Bison NP will arrange Hematology/Oncology referral per patient request. Pathology results reported by Stacie Acres RN on 02/06/2020. Electronically Signed   By: Margarette Canada M.D.   On: 02/06/2020 12:21   Result Date: 02/06/2020 CLINICAL DATA:  29 year old female for tissue sampling of 6.2 cm RIGHT axillary lymph node/mass. EXAM: [place] AXILLARY NODE CORE BIOPSY RIGHT COMPARISON:  Previous exam(s). PROCEDURE: I met with the patient and we discussed the procedure of ultrasound-guided biopsy, including benefits and alternatives. We discussed the high likelihood of a successful procedure. We discussed the risks of the procedure, including infection, bleeding, tissue injury, clip migration, and inadequate sampling. Informed written consent was given. The usual time-out protocol was performed immediately prior to the procedure. Using sterile technique and 1% Lidocaine as local anesthetic, under direct ultrasound visualization, a 12 gauge spring-loaded device was used to perform biopsy of the 6.2 cm RIGHT axillary lymph node/mass using a LATERAL approach. Specimens were placed in formalin and saline. At the conclusion of the procedure [facility] tissue marker clip was deployed into the biopsy cavity, with satisfactory placement confirmed sonographically. IMPRESSION: Ultrasound  guided biopsy of 6.2 cm RIGHT axillary lymph node/mass. No apparent complications. Electronically Signed: By: Margarette Canada M.D. On: 02/04/2020 14:48   [place] AXILLA RIGHT  Result Date: 02/04/2020 CLINICAL DATA:  29 year old female complaining of a palpable right axillary mass. Patient states that she has palpated the abnormality since June of 2021 and more recently developed a rash in the right axilla/upper outer quadrant of the breast. Patient has not received the COVID-19 vaccination and she has no underlying medical conditions. EXAM: DIGITAL DIAGNOSTIC BILATERAL MAMMOGRAM WITH CAD AND TOMO ULTRASOUND RIGHT BREAST COMPARISON:  None. ACR Breast Density Category c: The breast tissue is heterogeneously dense, which may obscure small masses. FINDINGS: I palpate fullness in the right axilla. There is erythema or non raised rash in the upper outer quadrant/right axilla. Targeted ultrasound shows a right axillary mass measuring 6.2 x 4.0 x 4.0 cm. There are 2 adjacent smaller axillary masses measuring 2.0 x 1.2 x 1.4 cm and 0.8 x 0.7 x 0.9 cm. Two view mammogram shows dense fibroglandular tissue. No suspicious mass, malignant type microcalcifications or distortion detected in either breast. Mammographic images were processed with CAD. IMPRESSION: Suspicious right axillary adenopathy. RECOMMENDATION: Ultrasound-guided core biopsy of the largest right axillary mass is recommended. Biopsy has been scheduled on 02/11/2020. The patient was placed on a 10 day course of doxycycline 100 mg p.o. b.i.d. for the possible rash in the upper-outer quadrant/right axilla. I have discussed the findings and recommendations with the patient. If applicable, a reminder letter will be sent to the patient regarding the next appointment. BI-RADS CATEGORY  4: Suspicious. Electronically Signed   By: Lillia Mountain M.D.   On: 02/04/2020 13:48   ECHOCARDIOGRAM LIMITED  Result Date: 02/18/2020    ECHOCARDIOGRAM LIMITED REPORT   Patient Name:    PRABHNOOR ELLENBERGER Date of Exam: 02/18/2020 Medical Rec #:  [id number]          Height:       64.0 in Accession #:    [id number]         Weight:       153.0 lb Date of Birth:  [birth year]          BSA:          1.746 m Patient Age:    29 years           BP:  104/62 mmHg Patient Gender: F                  HR:           75 bpm. Exam Location:  [facility] Procedure: Limited Echo, Limited Color Doppler and Cardiac Doppler Indications:     Lymphoma  History:         Patient has no prior history of Echocardiogram examinations.                  Lymphoma.  Sonographer:     Charmayne Sheer RDCS (AE) Referring Phys:  [id number] ZHOU YU Diagnosing Phys: Kate Sable MD IMPRESSIONS  1. Left ventricular ejection fraction, by estimation, is 60 to 65%. The left ventricle has normal function. The left ventricle has no regional wall motion abnormalities. Left ventricular diastolic function could not be evaluated.  2. Right ventricular systolic function is normal. The right ventricular size is normal.  3. The mitral valve is normal in structure. No evidence of mitral valve regurgitation. No evidence of mitral stenosis.  4. The aortic valve is normal in structure. Aortic valve regurgitation is not visualized. No aortic stenosis is present.  5. The inferior vena cava is normal in size with greater than 50% respiratory variability, suggesting right atrial pressure of 3 mmHg. FINDINGS  Left Ventricle: Left ventricular ejection fraction, by estimation, is 60 to 65%. The left ventricle has normal function. The left ventricle has no regional wall motion abnormalities. The left ventricular internal cavity size was normal in size. There is  no left ventricular hypertrophy. Left ventricular diastolic function could not be evaluated. Right Ventricle: The right ventricular size is normal. No increase in right ventricular wall thickness. Right ventricular systolic function is normal. Left Atrium: Left atrial size was normal in size. Right Atrium:  Right atrial size was normal in size. Pericardium: There is no evidence of pericardial effusion. Mitral Valve: The mitral valve is normal in structure. No evidence of mitral valve stenosis. Tricuspid Valve: The tricuspid valve is normal in structure. Tricuspid valve regurgitation is not demonstrated. No evidence of tricuspid stenosis. Aortic Valve: The aortic valve is normal in structure. Aortic valve regurgitation is not visualized. No aortic stenosis is present. Pulmonic Valve: The pulmonic valve was normal in structure. Pulmonic valve regurgitation is not visualized. No evidence of pulmonic stenosis. Aorta: The aortic root is normal in size and structure. Venous: The inferior vena cava is normal in size with greater than 50% respiratory variability, suggesting right atrial pressure of 3 mmHg. IAS/Shunts: No atrial level shunt detected by color flow Doppler. LEFT VENTRICLE PLAX 2D LVIDd:         4.74 cm LVIDs:         3.31 cm LV PW:         1.02 cm LV IVS:        0.80 cm LVOT diam:     2.20 cm LVOT Area:     3.80 cm  LEFT ATRIUM         Index LA diam:    3.50 cm 2.00 cm/m   AORTA Ao Root diam: 2.80 cm  SHUNTS Systemic Diam: 2.20 cm Kate Sable MD Electronically signed by Kate Sable MD Signature Date/Time: 02/18/2020/5:10:09 PM    Final      Assessment and plan- Patient is a 29 y.o. female who presents to [facility] for initial meeting in preparation for starting chemotherapy for the treatment of anaplastic large cell lymphoma ALK positive.  1. Anaplastic large cell lymphoma ALK positive-currently awaiting PET for staging.  PET currently awaiting review by insurance. ALCL patients often have extranodal disease that may be inadequately imaged by CT therefore PET scan is preferred and considered preferred standard of care.  Currently in appeals and awaiting decision.   Reviewed plan for chemotherapy including brentuximab vedotin + CHP (cyclophosphamide, doxorubicin, prednisone).  She has  been started on allopurinol for tumor lysis prophylaxis. We reviewed symptoms of tumor lysis syndrome including nausea and vomiting, shortness of breath, irregular heartbeat, clubbing of urine, lethargy, joint pain and I asked her to notify clinic if she experiences symptoms.  She is non-fertility desiring and I reinforced the need for contraception during chemotherapy.  She had Skyla IUD placed in November 2020.  I reviewed that she will need to plan to have IUD replaced in November 2023.  She is followed by Joya Salm, NP.   2. Chemo Care Clinic/Risk for ER/Hospitalization during chemotherapy- We discussed the role of the chemo care clinic and identified patient specific risk factors. I discussed that patient was identified as low risk of ER utilization of hospitalization.  When identified area of need is that patient does not currently have a primary care doctor.  I will make referral to local practice today for her to establish care.   3. Social Determinants of Health- we discussed that social determinants of health may have significant impacts on health and outcomes for cancer patients.  Today we discussed specific social determinants of performance status, alcohol use, depression, financial needs, food insecurity, housing, interpersonal violence, social connections, stress, tobacco use, and transportation.  We discussed a multitude of programs and services available through the cancer center including outpatient physical and occupational therapy, social work, Public librarian, counseling services, referrals to specialists, food bank, social group and social networks, dietitian, smoking cessation, and transportation assistance.  I encouraged patient to participate in regular physical activity as patients who do report fewer negative impacts of cancer and treatments and less fatigue.  After lengthy discussion no areas of need were identified.  4. Palliative Care-I advised her that  palliative care services are available to all patients.  Should she wish to participate in advance care planning or needs ongoing symptom management we can consider referral.  We also discussed the role of the Symptom Management Clinic at [facility] for acute issues and methods of contacting clinic/provider.  Return to clinic - tbd. Awaiting PET.   Visit Diagnosis 1. Anaplastic ALK-positive large cell lymphoma of lymph node of axilla ([place])   2. Routine adult health maintenance    I discussed the assessment and treatment plan with the patient. The patient was provided an opportunity to ask questions and all were answered. The patient agreed with the plan and demonstrated an understanding of the instructions.   The patient was advised to call back or seek an in-person evaluation if the symptoms worsen or if the condition fails to improve as anticipated.   I provided 25 minutes of face-to-face video visit time during this encounter, and > 50% was spent counseling as documented under my assessment & plan.  Beckey Rutter, DNP, AGNP-C [place] at [facility] [phone] (clinic)  CC: Dr. Tasia Catchings

## 2020-02-25 ENCOUNTER — Other Ambulatory Visit: Payer: Self-pay | Admitting: Oncology

## 2020-02-25 ENCOUNTER — Ambulatory Visit: Payer: BC Managed Care – PPO

## 2020-02-25 DIAGNOSIS — C8464 Anaplastic large cell lymphoma, ALK-positive, lymph nodes of axilla and upper limb: Secondary | ICD-10-CM

## 2020-02-25 LAB — SURGICAL PATHOLOGY

## 2020-02-25 NOTE — Telephone Encounter (Signed)
ASAP, after PET. If PET can be done earlier, that will be great. Thanks.

## 2020-02-25 NOTE — Telephone Encounter (Signed)
Done PET has been sched as requested and pt is aware  of her 03/02/20 appt location date and time.

## 2020-02-25 NOTE — Telephone Encounter (Signed)
Patient went to chemo class on 02/19/20.  Please schedule her for lab/MD/BV+CHP with day 3 IVF and Udenyca ASAP after PET and inform  her of appt details.

## 2020-02-25 NOTE — Telephone Encounter (Signed)
Please schedule her for lab md BV-CHP. She had chemo class done?

## 2020-02-25 NOTE — Telephone Encounter (Signed)
Please schedule PET and let patient know appt details.  PET is approved with Auth #[id number]

## 2020-02-25 NOTE — Telephone Encounter (Signed)
PET is now scheduled.  Would you like to schedule her follow up with you at this time?

## 2020-02-25 NOTE — Telephone Encounter (Signed)
Please call patient with appt details.  MyChart message sent to patient that we are getting her treatment scheduled.  Also advised to start the Allopurinol and bring antiemetics and steroid medication prescribed with her to appt and Dr. Tasia Catchings will discuss them with her.

## 2020-02-26 ENCOUNTER — Telehealth: Payer: Self-pay | Admitting: *Deleted

## 2020-02-26 ENCOUNTER — Encounter: Payer: Self-pay | Admitting: Nurse Practitioner

## 2020-02-26 DIAGNOSIS — R0602 Shortness of breath: Secondary | ICD-10-CM

## 2020-02-26 DIAGNOSIS — C8464 Anaplastic large cell lymphoma, ALK-positive, lymph nodes of axilla and upper limb: Secondary | ICD-10-CM

## 2020-02-26 NOTE — Telephone Encounter (Signed)
Patient called reporting that she is noticing that she is getting out of breath and that she has fatigue and a low grade fever. She is asking what to do or if this is normal. Please advise

## 2020-02-26 NOTE — Telephone Encounter (Signed)
I just saw this message and called her. I am not able to reach her.  Please call in AM and follow up on her symptoms. Please advise her to get CXR, arrange her to see me if her symptoms persist. Thanks.

## 2020-02-26 NOTE — Telephone Encounter (Signed)
Please schedule tx

## 2020-02-27 ENCOUNTER — Ambulatory Visit
Admission: RE | Admit: 2020-02-27 | Discharge: 2020-02-27 | Disposition: A | Discharge status: Home / Self Care | Payer: BC Managed Care – PPO | Source: Ambulatory Visit | Attending: Oncology | Admitting: Oncology

## 2020-02-27 ENCOUNTER — Telehealth: Payer: Self-pay

## 2020-02-27 ENCOUNTER — Emergency Department
Admission: EM | Admit: 2020-02-27 | Discharge: 2020-02-27 | Disposition: A | Discharge status: Home / Self Care | Payer: BC Managed Care – PPO | Attending: Emergency Medicine | Admitting: Emergency Medicine

## 2020-02-27 ENCOUNTER — Inpatient Hospital Stay (HOSPITAL_BASED_OUTPATIENT_CLINIC_OR_DEPARTMENT_OTHER): Payer: BC Managed Care – PPO | Admitting: Oncology

## 2020-02-27 ENCOUNTER — Inpatient Hospital Stay: Payer: BC Managed Care – PPO

## 2020-02-27 ENCOUNTER — Other Ambulatory Visit: Payer: Self-pay

## 2020-02-27 ENCOUNTER — Encounter: Payer: Self-pay | Admitting: Oncology

## 2020-02-27 ENCOUNTER — Ambulatory Visit
Admission: RE | Admit: 2020-02-27 | Discharge: 2020-02-27 | Disposition: A | Discharge status: Home / Self Care | Payer: BC Managed Care – PPO | Source: Home / Self Care | Attending: Oncology | Admitting: Oncology

## 2020-02-27 ENCOUNTER — Emergency Department: Payer: BC Managed Care – PPO

## 2020-02-27 VITALS — BP 126/74 | HR 92 | Temp 99.5°F | Resp 18 | Wt 153.1 lb

## 2020-02-27 DIAGNOSIS — C8464 Anaplastic large cell lymphoma, ALK-positive, lymph nodes of axilla and upper limb: Secondary | ICD-10-CM

## 2020-02-27 DIAGNOSIS — R0602 Shortness of breath: Secondary | ICD-10-CM | POA: Insufficient documentation

## 2020-02-27 DIAGNOSIS — R509 Fever, unspecified: Secondary | ICD-10-CM

## 2020-02-27 DIAGNOSIS — G893 Neoplasm related pain (acute) (chronic): Secondary | ICD-10-CM | POA: Diagnosis not present

## 2020-02-27 LAB — CBC WITH DIFFERENTIAL/PLATELET
Abs Immature Granulocytes: 0.02 10*3/uL (ref 0.00–0.07)
Basophils Absolute: 0 10*3/uL (ref 0.0–0.1)
Basophils Relative: 0 %
Eosinophils Absolute: 0 10*3/uL (ref 0.0–0.5)
Eosinophils Relative: 0 %
HCT: 37.8 % (ref 36.0–46.0)
Hemoglobin: 12.4 g/dL (ref 12.0–15.0)
Immature Granulocytes: 0 %
Lymphocytes Relative: 26 %
Lymphs Abs: 2.6 10*3/uL (ref 0.7–4.0)
MCH: 29.3 pg (ref 26.0–34.0)
MCHC: 32.8 g/dL (ref 30.0–36.0)
MCV: 89.4 fL (ref 80.0–100.0)
Monocytes Absolute: 0.6 10*3/uL (ref 0.1–1.0)
Monocytes Relative: 6 %
Neutro Abs: 6.7 10*3/uL (ref 1.7–7.7)
Neutrophils Relative %: 68 %
Platelets: 277 10*3/uL (ref 150–400)
RBC: 4.23 MIL/uL (ref 3.87–5.11)
RDW: 13.4 % (ref 11.5–15.5)
WBC: 10 10*3/uL (ref 4.0–10.5)
nRBC: 0 % (ref 0.0–0.2)

## 2020-02-27 LAB — COMPREHENSIVE METABOLIC PANEL
ALT: 11 U/L (ref 0–44)
AST: 17 U/L (ref 15–41)
Albumin: 4.5 g/dL (ref 3.5–5.0)
Alkaline Phosphatase: 57 U/L (ref 38–126)
Anion gap: 8 (ref 5–15)
BUN: 11 mg/dL (ref 6–20)
CO2: 25 mmol/L (ref 22–32)
Calcium: 9.5 mg/dL (ref 8.9–10.3)
Chloride: 104 mmol/L (ref 98–111)
Creatinine, Ser: 0.69 mg/dL (ref 0.44–1.00)
GFR, Estimated: >60 mL/min (ref >60)
Glucose, Bld: 113 mg/dL — ABNORMAL HIGH (ref 70–99)
Potassium: 4.2 mmol/L (ref 3.5–5.1)
Sodium: 137 mmol/L (ref 135–145)
Total Bilirubin: 0.6 mg/dL (ref 0.3–1.2)
Total Protein: 7.9 g/dL (ref 6.5–8.1)

## 2020-02-27 LAB — URINALYSIS, COMPLETE (UACMP) WITH MICROSCOPIC
Bilirubin Urine: NEGATIVE
Glucose, UA: NEGATIVE mg/dL
Hgb urine dipstick: NEGATIVE
Ketones, ur: NEGATIVE mg/dL
Leukocytes,Ua: NEGATIVE
Nitrite: NEGATIVE
Protein, ur: NEGATIVE mg/dL
Specific Gravity, Urine: 1.015 (ref 1.005–1.030)
pH: 6 (ref 5.0–8.0)

## 2020-02-27 LAB — POC URINE PREG, ED: Preg Test, Ur: NEGATIVE

## 2020-02-27 LAB — FIBRIN DERIVATIVES D-DIMER (ARMC ONLY): Fibrin derivatives D-dimer (ARMC): 604.72 ng/mL (FEU) — ABNORMAL HIGH (ref 0.00–499.00)

## 2020-02-27 MED ORDER — IOHEXOL 350 MG/ML SOLN
75.0000 mL | Freq: Once | INTRAVENOUS | Status: AC | PRN
  Administered 2020-02-27: 75 mL via INTRAVENOUS

## 2020-02-27 NOTE — ED Triage Notes (Signed)
Pt here after getting lab work done at [facility] and being advanced to come to the ER for further evaluation. Pt reports shortness of breath over the past couple of days that has increased yesterday. Pt states she has noticed it getting worse when talking in long sentences and with exertion.

## 2020-02-27 NOTE — Progress Notes (Signed)
Hematology/Oncology Consult note [facility] Telephone:([phone] [phone]   Patient Care Team: Patient, No Pcp Per as PCP - General (General Practice)  REFERRING PROVIDER: Arlyce Harman, NP  CHIEF COMPLAINTS/REASON FOR VISIT:  Evaluation of lymphoma  HISTORY OF PRESENTING ILLNESS:   Angel Tate is a  29 y.o.  female with PMH listed below was seen in consultation at the request of  Thongteum, Auma N, NP  for evaluation of lymphoma Patient reports feeling right axillary mass in June 2021.  Initially the mass did not bother her.  Patient developed a rash in the right axillary/upper outer quadrant of the breast and had additional work-up.  Patient was treated with a 10-day course of doxycycline for possible infection.  Did not improve. 02/04/2020, targeted ultrasound showed right axillary mass measuring 6.2 x 4 by 4 cm.  There are 2 adjacent smaller axillary mass measuring 2.0 x 1.2 x 1.4 cm and 0.8 x 0.7 x 0.9 cm.  Mammogram showed dense fibroglandular.  No suspicious mass or malignant type microcalcifications or distortion detected in either breast. Patient underwent ultrasound-guided core biopsy of the right axillary mass. 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  Ki-67 80-90% Flow cytometry has insufficient cells for analysis. Patient was referred to establish care for further evaluation and management. Retrospectively, patient recalls some night sweating.  No fever, chills, unintentional weight loss.  She has good energy level and appetite.  Today she was accompanied by her husband.  She has 2.  Children  INTERVAL HISTORY Angel Tate is a 29 y.o. female who has above history reviewed by me today presents for acute visit for shortness of breath and a low-grade fever that started yesterday.  Today shortness of breath is better, still has shortness of breath with exertion.  Denies any lower extremity swelling,  cough, sore throat.  Denies any dysuria. Today in the clinic she is afebrile.  Denies any previous history of blood clots or family history of blood clots She has been diagnosed with anaplastic large cell lymphoma ALK positive and is undergoing work-up and the plan to start chemotherapy next week.  PET scan is also scheduled next week.   Review of Systems  Constitutional: Positive for fever. Negative for appetite change, chills and fatigue.  HENT:   Negative for hearing loss and voice change.   Eyes: Negative for eye problems.  Respiratory: Positive for shortness of breath. Negative for chest tightness and cough.   Cardiovascular: Negative for chest pain.  Gastrointestinal: Negative for abdominal distention, abdominal pain and blood in stool.  Endocrine: Negative for hot flashes.       Night sweats  Genitourinary: Negative for difficulty urinating and frequency.   Musculoskeletal: Negative for arthralgias.  Skin: Negative for itching and rash.  Neurological: Negative for extremity weakness.  Hematological: Negative for adenopathy.  Psychiatric/Behavioral: Negative for confusion.    MEDICAL HISTORY:  Past Medical History:  Diagnosis Date  . Anxiety   . Depression   . Medical history non-contributory     SURGICAL HISTORY: Past Surgical History:  Procedure Laterality Date  . DILATION AND CURETTAGE OF UTERUS    . DILATION AND EVACUATION N/A 02/17/2017   Procedure: DILATATION AND EVACUATION;  Surgeon: Janyth Pupa, DO;  Location: [facility];  Service: Gynecology;  Laterality: N/A;  . PORTA CATH INSERTION N/A 02/23/2020   Procedure: PORTA CATH INSERTION;  Surgeon: Algernon Huxley, MD;  Location: [place] [facility];  Service: Cardiovascular;  Laterality: N/A;  SOCIAL HISTORY: Social History   Socioeconomic History  . Marital status: Married    Spouse name: [name]  . Number of children: 1  . Years of education: Not on file  . Highest education level: Not on file  Occupational  History  . Occupation: social media- marketing   Tobacco Use  . Smoking status: Never Smoker  . Smokeless tobacco: Never Used  Vaping Use  . Vaping Use: Never used  Substance and Sexual Activity  . Alcohol use: Yes    Comment: socially  . Drug use: No  . Sexual activity: Yes    Birth control/protection: None  Other Topics Concern  . Not on file  Social History Narrative  . Not on file   Social Determinants of Health   Financial Resource Strain:   . Difficulty of Paying Living Expenses: Not on file  Food Insecurity:   . Worried About Charity fundraiser in the Last Year: Not on file  . Ran Out of Food in the Last Year: Not on file  Transportation Needs:   . Lack of Transportation (Medical): Not on file  . Lack of Transportation (Non-Medical): Not on file  Physical Activity:   . Days of Exercise per Week: Not on file  . Minutes of Exercise per Session: Not on file  Stress:   . Feeling of Stress : Not on file  Social Connections:   . Frequency of Communication with Friends and Family: Not on file  . Frequency of Social Gatherings with Friends and Family: Not on file  . Attends Religious Services: Not on file  . Active Member of Clubs or Organizations: Not on file  . Attends Archivist Meetings: Not on file  . Marital Status: Not on file  Intimate Partner Violence:   . Fear of Current or Ex-Partner: Not on file  . Emotionally Abused: Not on file  . Physically Abused: Not on file  . Sexually Abused: Not on file    FAMILY HISTORY: Family History  Problem Relation Age of Onset  . Hypertension Maternal Grandmother   . Asthma Maternal Aunt   . Ovarian cancer Paternal Grandmother     ALLERGIES:  has No Known Allergies.  MEDICATIONS:  Current Outpatient Medications  Medication Sig Dispense Refill  . acetaminophen-codeine (TYLENOL #3) 300-30 MG tablet Take 1 tablet by mouth every 8 (eight) hours as needed for moderate pain or severe pain. 30 tablet 0  .  allopurinol (ZYLOPRIM) 300 MG tablet Take 1 tablet (300 mg total) by mouth daily. 30 tablet 3  . ibuprofen (ADVIL,MOTRIN) 200 MG tablet Take 3 tablets (600 mg total) by mouth every 6 (six) hours as needed (for pain.). 30 tablet 0  . lidocaine-prilocaine (EMLA) cream Apply to affected area once 30 g 3  . ondansetron (ZOFRAN) 8 MG tablet Take 1 tablet (8 mg total) by mouth 2 (two) times daily as needed for refractory nausea / vomiting. Start on day 3 after cyclophosphamide chemotherapy. 30 tablet 1  . prochlorperazine (COMPAZINE) 10 MG tablet Take 1 tablet (10 mg total) by mouth every 6 (six) hours as needed (Nausea or vomiting). 30 tablet 6  . sertraline (ZOLOFT) 100 MG tablet Take 100 mg by mouth at bedtime.   11  . predniSONE (DELTASONE) 20 MG tablet Take 5 tablets (100 mg total) by mouth daily. Take on days 2-5 of chemotherapy. (Patient not taking: Reported on 02/23/2020) 20 tablet 7   No current facility-administered medications for this visit.  PHYSICAL EXAMINATION: ECOG PERFORMANCE STATUS: 0 - Asymptomatic Vitals:   02/27/20 1327  BP: 126/74  Pulse: 92  Resp: 18  Temp: 99.5 F (37.5 C)  SpO2: 99%   Filed Weights   02/27/20 1327  Weight: 153 lb 1.6 oz (69.4 kg)    Physical Exam Constitutional:      General: She is not in acute distress. HENT:     Head: Normocephalic and atraumatic.  Eyes:     General: No scleral icterus. Cardiovascular:     Rate and Rhythm: Normal rate and regular rhythm.     Heart sounds: Normal heart sounds.  Pulmonary:     Effort: Pulmonary effort is normal. No respiratory distress.     Breath sounds: No wheezing.  Abdominal:     General: Bowel sounds are normal. There is no distension.     Palpations: Abdomen is soft.  Musculoskeletal:        General: No deformity. Normal range of motion.     Cervical back: Normal range of motion and neck supple.  Skin:    General: Skin is warm and dry.     Findings: No erythema or rash.  Neurological:      Mental Status: She is alert and oriented to person, place, and time. Mental status is at baseline.     Cranial Nerves: No cranial nerve deficit.     Coordination: Coordination normal.  Psychiatric:        Mood and Affect: Mood normal.   Right axillary palpable fullness.  LABORATORY DATA:  I have reviewed the data as listed Lab Results  Component Value Date   WBC 10.0 02/27/2020   HGB 12.4 02/27/2020   HCT 37.8 02/27/2020   MCV 89.4 02/27/2020   PLT 277 02/27/2020   Recent Labs    02/13/20 1146 02/27/20 1259  NA 139 137  K 4.2 4.2  CL 103 104  CO2 27 25  GLUCOSE 95 113*  BUN 12 11  CREATININE 0.79 0.69  CALCIUM 9.3 9.5  GFRNONAA >60 >60  PROT 8.0 7.9  ALBUMIN 4.5 4.5  AST 16 17  ALT 13 11  ALKPHOS 65 57  BILITOT 0.5 0.6   Iron/TIBC/Ferritin/ %Sat No results found for: IRON, TIBC, FERRITIN, IRONPCTSAT    RADIOGRAPHIC STUDIES: I have personally reviewed the radiological images as listed and agreed with the findings in the report. PERIPHERAL VASCULAR CATHETERIZATION  Result Date: 02/23/2020 See op note  CT BONE MARROW BIOPSY & ASPIRATION  Result Date: 02/17/2020 INDICATION: Recent diagnosis of lymphoma. Please perform CT-guided bone marrow biopsy for tissue diagnostic purposes. EXAM: CT-GUIDED BONE MARROW BIOPSY AND ASPIRATION MEDICATIONS: None ANESTHESIA/SEDATION: Fentanyl 2 mcg IV; Versed 100 mg IV Sedation Time: 10 Minutes; The patient was continuously monitored during the procedure by the interventional radiology nurse under my direct supervision. COMPLICATIONS: None immediate. PROCEDURE: Informed consent was obtained from the patient following an explanation of the procedure, risks, benefits and alternatives. The patient understands, agrees and consents for the procedure. All questions were addressed. A time out was performed prior to the initiation of the procedure. The patient was positioned prone and non-contrast localization CT was performed of the pelvis to  demonstrate the iliac marrow spaces. The operative site was prepped and draped in the usual sterile fashion. Under sterile conditions and local anesthesia, a 22 gauge spinal needle was utilized for procedural planning. Next, an 11 gauge coaxial bone biopsy needle was advanced into the left iliac marrow space. Needle position was confirmed with CT  imaging. Initially, a bone marrow aspiration was performed. Next, a bone marrow biopsy was obtained with the 11 gauge outer bone marrow device. The 11 gauge coaxial bone biopsy needle was re-advanced into a slightly different location within the left iliac marrow space, positioning was confirmed with CT imaging and an additional bone marrow biopsy was obtained. The needle was removed and superficial hemostasis was obtained with manual compression. A dressing was applied. The patient tolerated the procedure well without immediate post procedural complication. IMPRESSION: Successful CT guided left iliac bone marrow aspiration and core biopsy. Electronically Signed   By: Sandi Mariscal M.D.   On: 02/17/2020 11:01   MM DIAG BREAST TOMO BILATERAL  Result Date: 02/04/2020 CLINICAL DATA:  29 year old female complaining of a palpable right axillary mass. Patient states that she has palpated the abnormality since June of 2021 and more recently developed a rash in the right axilla/upper outer quadrant of the breast. Patient has not received the COVID-19 vaccination and she has no underlying medical conditions. EXAM: DIGITAL DIAGNOSTIC BILATERAL MAMMOGRAM WITH CAD AND TOMO ULTRASOUND RIGHT BREAST COMPARISON:  None. ACR Breast Density Category c: The breast tissue is heterogeneously dense, which may obscure small masses. FINDINGS: I palpate fullness in the right axilla. There is erythema or non raised rash in the upper outer quadrant/right axilla. Targeted ultrasound shows a right axillary mass measuring 6.2 x 4.0 x 4.0 cm. There are 2 adjacent smaller axillary masses measuring 2.0 x  1.2 x 1.4 cm and 0.8 x 0.7 x 0.9 cm. Two view mammogram shows dense fibroglandular tissue. No suspicious mass, malignant type microcalcifications or distortion detected in either breast. Mammographic images were processed with CAD. IMPRESSION: Suspicious right axillary adenopathy. RECOMMENDATION: Ultrasound-guided core biopsy of the largest right axillary mass is recommended. Biopsy has been scheduled on 02/11/2020. The patient was placed on a 10 day course of doxycycline 100 mg p.o. b.i.d. for the possible rash in the upper-outer quadrant/right axilla. I have discussed the findings and recommendations with the patient. If applicable, a reminder letter will be sent to the patient regarding the next appointment. BI-RADS CATEGORY  4: Suspicious. Electronically Signed   By: Lillia Mountain M.D.   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 Core biopsy reveals sheets of large atypical lymphoid cells with frequent mitotic figures and apoptotic debris. Flow cytometry has insufficient cells for analysis. 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 This was found to be concordant by Dr. Hassan Rowan. The patient reported doing well on 02/05/20 after the biopsy with tenderness at the site. Post biopsy instructions and care were reviewed and questions were answered. The patient was encouraged to call [facility] for any additional concerns. Results and recommendations called and faxed to [facility] LPN with [facility] and Gynecology on 02/06/20. Dr. Delice Bison NP will arrange Hematology/Oncology referral per patient request. Pathology results reported by Stacie Acres RN on 02/06/2020. Electronically Signed   By: Margarette Canada M.D.   On: 02/06/2020 12:21   Result Date:  02/06/2020 CLINICAL DATA:  29 year old female for tissue sampling of 6.2 cm RIGHT axillary lymph node/mass. EXAM: [place] AXILLARY NODE CORE BIOPSY RIGHT COMPARISON:  Previous exam(s). PROCEDURE: I met with the patient and we discussed the procedure of ultrasound-guided biopsy, including benefits and alternatives. We discussed the  high likelihood of a successful procedure. We discussed the risks of the procedure, including infection, bleeding, tissue injury, clip migration, and inadequate sampling. Informed written consent was given. The usual time-out protocol was performed immediately prior to the procedure. Using sterile technique and 1% Lidocaine as local anesthetic, under direct ultrasound visualization, a 12 gauge spring-loaded device was used to perform biopsy of the 6.2 cm RIGHT axillary lymph node/mass using a LATERAL approach. Specimens were placed in formalin and saline. At the conclusion of the procedure [facility] tissue marker clip was deployed into the biopsy cavity, with satisfactory placement confirmed sonographically. IMPRESSION: Ultrasound guided biopsy of 6.2 cm RIGHT axillary lymph node/mass. No apparent complications. Electronically Signed: By: Margarette Canada M.D. On: 02/04/2020 14:48   [place] AXILLA RIGHT  Result Date: 02/04/2020 CLINICAL DATA:  29 year old female complaining of a palpable right axillary mass. Patient states that she has palpated the abnormality since June of 2021 and more recently developed a rash in the right axilla/upper outer quadrant of the breast. Patient has not received the COVID-19 vaccination and she has no underlying medical conditions. EXAM: DIGITAL DIAGNOSTIC BILATERAL MAMMOGRAM WITH CAD AND TOMO ULTRASOUND RIGHT BREAST COMPARISON:  None. ACR Breast Density Category c: The breast tissue is heterogeneously dense, which may obscure small masses. FINDINGS: I palpate fullness in the right axilla. There is erythema or non raised rash in the upper outer quadrant/right axilla.  Targeted ultrasound shows a right axillary mass measuring 6.2 x 4.0 x 4.0 cm. There are 2 adjacent smaller axillary masses measuring 2.0 x 1.2 x 1.4 cm and 0.8 x 0.7 x 0.9 cm. Two view mammogram shows dense fibroglandular tissue. No suspicious mass, malignant type microcalcifications or distortion detected in either breast. Mammographic images were processed with CAD. IMPRESSION: Suspicious right axillary adenopathy. RECOMMENDATION: Ultrasound-guided core biopsy of the largest right axillary mass is recommended. Biopsy has been scheduled on 02/11/2020. The patient was placed on a 10 day course of doxycycline 100 mg p.o. b.i.d. for the possible rash in the upper-outer quadrant/right axilla. I have discussed the findings and recommendations with the patient. If applicable, a reminder letter will be sent to the patient regarding the next appointment. BI-RADS CATEGORY  4: Suspicious. Electronically Signed   By: Lillia Mountain M.D.   On: 02/04/2020 13:48   ECHOCARDIOGRAM LIMITED  Result Date: 02/18/2020    ECHOCARDIOGRAM LIMITED REPORT   Patient Name:   Angel Tate Date of Exam: 02/18/2020 Medical Rec #:  [id number]          Height:       64.0 in Accession #:    [id number]         Weight:       153.0 lb Date of Birth:  August 21, 1990          BSA:          1.746 m Patient Age:    29 years           BP:           104/62 mmHg Patient Gender: F                  HR:           75 bpm. Exam Location:  [facility] Procedure: Limited Echo, Limited Color Doppler and Cardiac Doppler Indications:     Lymphoma  History:         Patient has no prior history of Echocardiogram examinations.  Lymphoma.  Sonographer:     Charmayne Sheer RDCS (AE) Referring Phys:  [id number] [place] Diagnosing Phys: Kate Sable MD IMPRESSIONS  1. Left ventricular ejection fraction, by estimation, is 60 to 65%. The left ventricle has normal function. The left ventricle has no regional wall motion abnormalities. Left ventricular diastolic  function could not be evaluated.  2. Right ventricular systolic function is normal. The right ventricular size is normal.  3. The mitral valve is normal in structure. No evidence of mitral valve regurgitation. No evidence of mitral stenosis.  4. The aortic valve is normal in structure. Aortic valve regurgitation is not visualized. No aortic stenosis is present.  5. The inferior vena cava is normal in size with greater than 50% respiratory variability, suggesting right atrial pressure of 3 mmHg. FINDINGS  Left Ventricle: Left ventricular ejection fraction, by estimation, is 60 to 65%. The left ventricle has normal function. The left ventricle has no regional wall motion abnormalities. The left ventricular internal cavity size was normal in size. There is  no left ventricular hypertrophy. Left ventricular diastolic function could not be evaluated. Right Ventricle: The right ventricular size is normal. No increase in right ventricular wall thickness. Right ventricular systolic function is normal. Left Atrium: Left atrial size was normal in size. Right Atrium: Right atrial size was normal in size. Pericardium: There is no evidence of pericardial effusion. Mitral Valve: The mitral valve is normal in structure. No evidence of mitral valve stenosis. Tricuspid Valve: The tricuspid valve is normal in structure. Tricuspid valve regurgitation is not demonstrated. No evidence of tricuspid stenosis. Aortic Valve: The aortic valve is normal in structure. Aortic valve regurgitation is not visualized. No aortic stenosis is present. Pulmonic Valve: The pulmonic valve was normal in structure. Pulmonic valve regurgitation is not visualized. No evidence of pulmonic stenosis. Aorta: The aortic root is normal in size and structure. Venous: The inferior vena cava is normal in size with greater than 50% respiratory variability, suggesting right atrial pressure of 3 mmHg. IAS/Shunts: No atrial level shunt detected by color flow Doppler.  LEFT VENTRICLE PLAX 2D LVIDd:         4.74 cm LVIDs:         3.31 cm LV PW:         1.02 cm LV IVS:        0.80 cm LVOT diam:     2.20 cm LVOT Area:     3.80 cm  LEFT ATRIUM         Index LA diam:    3.50 cm 2.00 cm/m   AORTA Ao Root diam: 2.80 cm  SHUNTS Systemic Diam: 2.20 cm Kate Sable MD Electronically signed by Kate Sable MD Signature Date/Time: 02/18/2020/5:10:09 PM    Final       ASSESSMENT & PLAN:  1. 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 Shortness of breath   3. Neoplasm related pain   4. 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  Work-up in progress PET scan is pending next week. Echocardiogram showed normal LVEF Bone marrow biopsy is negative I recommend chemotherapy with BV+ CHP, plan next week. #Low-grade fever, can be secondary to lymphoma. UA is negative.  No leukocytosis.  Patient has had flu vaccination.  She has not had COVID-19 vaccination.  Advised patient to test COVID-19.  #Neoplasm related pain, continue Tylenol#3 as instructed. #Shortness  of breath, I obtained chest x-ray which to my reading no pleural effusion, pneumonia.  Awaiting official reading. With her lymphoma history, she is at risk of developing acute pulmonary embolism. I will obtain D-dimer.  D-dimer came back positive.  I attempted to obtain outpatient stat CT chest angiogram however the study requires prior authorization and at current time, I was not able to obtain prior authorization. Recommend patient to go to emergency room for further evaluation. I called ER triage nurse and updated patient's information.   Supportive care measures are necessary for patient well-being and will be provided as necessary. We spent sufficient time to discuss many aspect of care, questions were answered to patient's satisfaction.    Orders Placed This Encounter   Procedures  . Fibrin derivatives D-Dimer [facility])    Standing Status:   Future    Number of Occurrences:   1    Standing Expiration Date:   02/26/2021    All questions were answered. The patient knows to call the clinic with any problems questions or concerns.  cc Thongteum, Maryjo [place], NP    Return visit as currently scheduled. Earlie Server, MD, PhD Hematology Oncology [facility] Pager- [phone] 02/27/2020

## 2020-02-27 NOTE — Telephone Encounter (Signed)
MyChart message sent to patient asking how she is feeling this morning.

## 2020-02-27 NOTE — Telephone Encounter (Signed)
Please schedule patient for lab/MD (add on) today and call patient with appt details.  Dr. Tasia Catchings wants patient to have chest x-ray today as well as lab/MD.

## 2020-02-27 NOTE — Progress Notes (Signed)
Patient here for acute visit. Patient reports increased tiredness and shortness of breath over the past week. Pt reports having low grade temp yesterday. Temp today is 99.5

## 2020-02-27 NOTE — Telephone Encounter (Signed)
Secure chat received from Dr. Tasia Catchings: D dimer lab is positive. please arrange her to do a stat CT chest angiogram.  I have discussed the possibility with her   We were not able to schedule the STAT CT angiogram due to insurance auth before can be scheduled but we are not able to obtain due to our insurance auth is closed for the day.  Dr. Tasia Catchings advises patient to go to ER for CT chest angiogram done for positive D-dimer.  Dr. Tasia Catchings has called ER.  Patient agrees and is going to go to [facility] ER.

## 2020-02-27 NOTE — ED Provider Notes (Signed)
[facility] Emergency Department Provider Note   ____________________________________________    I have reviewed the triage vital signs and the nursing notes.   HISTORY  Chief Complaint Shortness of Breath     HPI Angel Tate is a 29 y.o. 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  Patient saw her oncologist today has been having some increased redness of breath over the last 2 days, denies cough, mild elevation in temperature.  Has not been vaccinated for COVID-19.  Had D-dimer which was elevated at the cancer center.  Referred to the ED for CT scan.  Currently patient reports he is feeling okay, no chest pain or discomfort or pleurisy, no recent travel  Past Medical History:  Diagnosis Date  . Anxiety   . Depression   . Medical history non-contributory     Patient Active Problem List   Diagnosis Date Noted  . Shortness of breath 02/27/2020  . 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 Goals of care, counseling/discussion 02/13/2020  . Indication for care in labor or delivery 01/19/2019  . Postpartum care following vaginal delivery 9/20 01/19/2019  . Anxiety and depression 01/19/2019  . Vaginal delivery 01/23/2014    Past Surgical History:  Procedure Laterality Date  . DILATION AND CURETTAGE OF UTERUS    . DILATION AND EVACUATION N/A 02/17/2017   Procedure: DILATATION AND EVACUATION;  Surgeon: Janyth Pupa, DO;  Location: [facility];  Service: Gynecology;  Laterality: N/A;  . PORTA CATH INSERTION N/A 02/23/2020   Procedure: PORTA CATH INSERTION;  Surgeon: Algernon Huxley, MD;  Location: [facility];  Service: Cardiovascular;  Laterality: N/A;    Prior to Admission medications   Medication Sig Start Date End Date Taking? Authorizing Provider  acetaminophen-codeine  (TYLENOL #3) 300-30 MG tablet Take 1 tablet by mouth every 8 (eight) hours as needed for moderate pain or severe pain. 02/16/20   Earlie Server, MD  allopurinol (ZYLOPRIM) 300 MG tablet Take 1 tablet (300 mg total) by mouth daily. 02/17/20   Earlie Server, MD  ibuprofen (ADVIL,MOTRIN) 200 MG tablet Take 3 tablets (600 mg total) by mouth every 6 (six) hours as needed (for pain.). 02/17/17   Janyth Pupa, DO  lidocaine-prilocaine (EMLA) cream Apply to affected area once 02/17/20   Earlie Server, MD  ondansetron (ZOFRAN) 8 MG tablet Take 1 tablet (8 mg total) by mouth 2 (two) times daily as needed for refractory nausea / vomiting. Start on day 3 after cyclophosphamide chemotherapy. 02/17/20   Earlie Server, MD  predniSONE (DELTASONE) 20 MG tablet Take 5 tablets (100 mg total) by mouth daily. Take on days 2-5 of chemotherapy. Patient not taking: Reported on 02/23/2020 02/17/20   Earlie Server, MD  prochlorperazine (COMPAZINE) 10 MG tablet Take 1 tablet (10 mg total) by mouth every 6 (six) hours as needed (Nausea or vomiting). 02/17/20   Earlie Server, MD  sertraline (ZOLOFT) 100 MG tablet Take 100 mg by mouth at bedtime.  02/12/17   [provider]     Allergies Patient has no known allergies.  Family History  Problem Relation Age of Onset  . Hypertension Maternal Grandmother   . Asthma Maternal Aunt   . Ovarian cancer Paternal Grandmother     Social History Social History   Tobacco Use  . Smoking status: Never Smoker  . Smokeless  tobacco: Never Used  Vaping Use  . Vaping Use: Never used  Substance Use Topics  . Alcohol use: Yes    Comment: socially  . Drug use: No    Review of Systems  Constitutional: No fever/chills Eyes: No visual changes.  ENT: No sore throat. Cardiovascular: As above Respiratory: As above Gastrointestinal: No abdominal pain.  No nausea, no vomiting.   Genitourinary: Negative for dysuria. Musculoskeletal: No calf pain or swelling Skin: Negative for rash. Neurological:  Negative for headaches or weakness   ____________________________________________   PHYSICAL EXAM:  VITAL SIGNS: ED Triage Vitals  Enc Vitals Group     BP 02/27/20 1845 127/75     Pulse Rate 02/27/20 1845 84     Resp 02/27/20 1845 (!) 22     Temp 02/27/20 1845 99.1 F (37.3 C)     Temp Source 02/27/20 1845 Oral     SpO2 02/27/20 1844 99 %     Weight 02/27/20 1845 69.4 kg (153 lb)     Height 02/27/20 1845 1.626 m ('5\' 4"' )     Head Circumference --      Peak Flow --      Pain Score 02/27/20 1845 3     Pain Loc --      Pain Edu? --      Excl. in [place]? --     Constitutional: Alert and oriented .  Nose: No congestion/rhinnorhea. Mouth/Throat: Mucous membranes are moist.    Cardiovascular: Normal rate, regular rhythm. Grossly normal heart sounds.  Good peripheral circulation. Respiratory: Normal respiratory effort.  No retractions. Lungs CTAB. Gastrointestinal: Soft and nontender. No distention.    Musculoskeletal: No lower extremity tenderness nor edema.  Warm and well perfused Neurologic:  Normal speech and language. No gross focal neurologic deficits are appreciated.  Skin:  Skin is warm, dry and intact. No rash noted. Psychiatric: Mood and affect are normal. Speech and behavior are normal.  ____________________________________________   LABS (all labs ordered are listed, but only abnormal results are displayed)  Labs Reviewed  POC URINE PREG, ED   ____________________________________________  EKG  ED ECG REPORT I, Lavonia Drafts, the attending physician, personally viewed and interpreted this ECG.  Date: 02/27/2020  Rhythm: normal sinus rhythm QRS Axis: normal Intervals: normal ST/T Wave abnormalities: normal Narrative Interpretation: no evidence of acute ischemia  ____________________________________________  RADIOLOGY  CT angiography reviewed by me, no clear PE noted by me, pending radiology  review ____________________________________________   PROCEDURES  Procedure(s) performed: No  Procedures   Critical Care performed: No ____________________________________________   INITIAL IMPRESSION / ASSESSMENT AND PLAN / ED COURSE  Pertinent labs & imaging results that were available during my care of the patient were reviewed by me and considered in my medical decision making (see chart for details).  Patient with a history of large cell lymphoma presents with mildly increased shortness of breath, no pleurisy calf pain or swelling.  Certainly concern for possible PE given her history.  No cough to suggest pneumonia.  Had elevated D-dimer at cancer center.  Exam is overall reassuring.  She has not been vaccinated as COVID-19 however does not have cough body aches loss of taste or smell etc.  We will obtain CT angiography  CT scan confirmed by radiology, no PE no pneumonia  Patient significantly relief, appropriate for discharge at this time with close follow-up with her oncologist      ____________________________________________   FINAL CLINICAL IMPRESSION(S) / ED DIAGNOSES  Final diagnoses:  SOB (shortness  of breath)        Note:  This document was prepared using Dragon voice recognition software and may include unintentional dictation errors.   Lavonia Drafts, MD 02/27/20 2018

## 2020-02-27 NOTE — Telephone Encounter (Signed)
Done.. Pt has been added on for today. A detailed message was left on pt VM making her aware.Marland Kitchen

## 2020-02-28 ENCOUNTER — Encounter: Payer: Self-pay | Admitting: Oncology

## 2020-03-02 ENCOUNTER — Encounter
Admission: RE | Admit: 2020-03-02 | Discharge: 2020-03-02 | Disposition: A | Discharge status: Home / Self Care | Payer: BC Managed Care – PPO | Source: Ambulatory Visit | Attending: Oncology | Admitting: Oncology

## 2020-03-02 ENCOUNTER — Other Ambulatory Visit: Payer: Self-pay

## 2020-03-02 DIAGNOSIS — C8464 Anaplastic large cell lymphoma, ALK-positive, lymph nodes of axilla and upper limb: Secondary | ICD-10-CM | POA: Insufficient documentation

## 2020-03-02 LAB — GLUCOSE, CAPILLARY: Glucose-Capillary: 95 mg/dL (ref 70–99)

## 2020-03-02 MED ORDER — FLUDEOXYGLUCOSE F - 18 (FDG) INJECTION
7.2510 | Freq: Once | INTRAVENOUS | Status: AC | PRN
  Administered 2020-03-02: 7.251 via INTRAVENOUS

## 2020-03-03 ENCOUNTER — Inpatient Hospital Stay: Payer: BC Managed Care – PPO

## 2020-03-03 ENCOUNTER — Inpatient Hospital Stay (HOSPITAL_BASED_OUTPATIENT_CLINIC_OR_DEPARTMENT_OTHER): Payer: BC Managed Care – PPO | Admitting: Oncology

## 2020-03-03 ENCOUNTER — Encounter: Payer: Self-pay | Admitting: Oncology

## 2020-03-03 ENCOUNTER — Inpatient Hospital Stay: Payer: BC Managed Care – PPO | Attending: Oncology

## 2020-03-03 VITALS — BP 125/55 | HR 76 | Temp 99.0°F | Resp 18 | Wt 155.7 lb

## 2020-03-03 DIAGNOSIS — R112 Nausea with vomiting, unspecified: Secondary | ICD-10-CM | POA: Insufficient documentation

## 2020-03-03 DIAGNOSIS — Z5112 Encounter for antineoplastic immunotherapy: Secondary | ICD-10-CM | POA: Diagnosis present

## 2020-03-03 DIAGNOSIS — Z5111 Encounter for antineoplastic chemotherapy: Secondary | ICD-10-CM | POA: Diagnosis not present

## 2020-03-03 DIAGNOSIS — Z5189 Encounter for other specified aftercare: Secondary | ICD-10-CM | POA: Diagnosis not present

## 2020-03-03 DIAGNOSIS — C8464 Anaplastic large cell lymphoma, ALK-positive, lymph nodes of axilla and upper limb: Secondary | ICD-10-CM | POA: Diagnosis not present

## 2020-03-03 DIAGNOSIS — G893 Neoplasm related pain (acute) (chronic): Secondary | ICD-10-CM | POA: Diagnosis not present

## 2020-03-03 DIAGNOSIS — Z79899 Other long term (current) drug therapy: Secondary | ICD-10-CM | POA: Diagnosis not present

## 2020-03-03 DIAGNOSIS — R197 Diarrhea, unspecified: Secondary | ICD-10-CM | POA: Diagnosis not present

## 2020-03-03 DIAGNOSIS — Z7189 Other specified counseling: Secondary | ICD-10-CM

## 2020-03-03 DIAGNOSIS — Z95828 Presence of other vascular implants and grafts: Secondary | ICD-10-CM

## 2020-03-03 LAB — PREGNANCY, URINE: Preg Test, Ur: NEGATIVE

## 2020-03-03 LAB — CBC WITH DIFFERENTIAL/PLATELET
Abs Immature Granulocytes: 0.02 10*3/uL (ref 0.00–0.07)
Basophils Absolute: 0 10*3/uL (ref 0.0–0.1)
Basophils Relative: 1 %
Eosinophils Absolute: 0.1 10*3/uL (ref 0.0–0.5)
Eosinophils Relative: 2 %
HCT: 36.6 % (ref 36.0–46.0)
Hemoglobin: 11.9 g/dL — ABNORMAL LOW (ref 12.0–15.0)
Immature Granulocytes: 0 %
Lymphocytes Relative: 26 %
Lymphs Abs: 1.7 10*3/uL (ref 0.7–4.0)
MCH: 29.5 pg (ref 26.0–34.0)
MCHC: 32.5 g/dL (ref 30.0–36.0)
MCV: 90.8 fL (ref 80.0–100.0)
Monocytes Absolute: 0.5 10*3/uL (ref 0.1–1.0)
Monocytes Relative: 8 %
Neutro Abs: 4.1 10*3/uL (ref 1.7–7.7)
Neutrophils Relative %: 63 %
Platelets: 247 10*3/uL (ref 150–400)
RBC: 4.03 MIL/uL (ref 3.87–5.11)
RDW: 13.3 % (ref 11.5–15.5)
WBC: 6.4 10*3/uL (ref 4.0–10.5)
nRBC: 0 % (ref 0.0–0.2)

## 2020-03-03 LAB — COMPREHENSIVE METABOLIC PANEL
ALT: 11 U/L (ref 0–44)
AST: 14 U/L — ABNORMAL LOW (ref 15–41)
Albumin: 4.1 g/dL (ref 3.5–5.0)
Alkaline Phosphatase: 49 U/L (ref 38–126)
Anion gap: 9 (ref 5–15)
BUN: 13 mg/dL (ref 6–20)
CO2: 22 mmol/L (ref 22–32)
Calcium: 9.2 mg/dL (ref 8.9–10.3)
Chloride: 106 mmol/L (ref 98–111)
Creatinine, Ser: 0.63 mg/dL (ref 0.44–1.00)
GFR, Estimated: >60 mL/min (ref >60)
Glucose, Bld: 98 mg/dL (ref 70–99)
Potassium: 4.2 mmol/L (ref 3.5–5.1)
Sodium: 137 mmol/L (ref 135–145)
Total Bilirubin: 0.5 mg/dL (ref 0.3–1.2)
Total Protein: 7.4 g/dL (ref 6.5–8.1)

## 2020-03-03 LAB — MAGNESIUM: Magnesium: 2 mg/dL (ref 1.7–2.4)

## 2020-03-03 LAB — URIC ACID: Uric Acid, Serum: 3.2 mg/dL (ref 2.5–7.1)

## 2020-03-03 LAB — PHOSPHORUS: Phosphorus: 3.2 mg/dL (ref 2.5–4.6)

## 2020-03-03 MED ORDER — SODIUM CHLORIDE 0.9 % IV SOLN
10.0000 mg | Freq: Once | INTRAVENOUS | Status: AC
  Administered 2020-03-03: 10 mg via INTRAVENOUS
  Filled 2020-03-03: qty 10

## 2020-03-03 MED ORDER — SODIUM CHLORIDE 0.9 % IV SOLN
1.8000 mg/kg | Freq: Once | INTRAVENOUS | Status: AC
  Administered 2020-03-03: 125 mg via INTRAVENOUS
  Filled 2020-03-03: qty 25

## 2020-03-03 MED ORDER — PALONOSETRON HCL INJECTION 0.25 MG/5ML
0.2500 mg | Freq: Once | INTRAVENOUS | Status: AC
  Administered 2020-03-03: 0.25 mg via INTRAVENOUS
  Filled 2020-03-03: qty 5

## 2020-03-03 MED ORDER — SODIUM CHLORIDE 0.9 % IV SOLN
150.0000 mg | Freq: Once | INTRAVENOUS | Status: AC
  Administered 2020-03-03: 150 mg via INTRAVENOUS
  Filled 2020-03-03: qty 150

## 2020-03-03 MED ORDER — OXYCODONE HCL 5 MG PO TABS: 5.0000 mg | ORAL_TABLET | Freq: Four times a day (QID) | ORAL | 0 refills | Status: DC | PRN

## 2020-03-03 MED ORDER — GABAPENTIN 100 MG PO CAPS: 100.0000 mg | ORAL_CAPSULE | Freq: Two times a day (BID) | ORAL | 0 refills | Status: DC

## 2020-03-03 MED ORDER — SODIUM CHLORIDE 0.9 % IV SOLN
750.0000 mg/m2 | Freq: Once | INTRAVENOUS | Status: AC
  Administered 2020-03-03: 1320 mg via INTRAVENOUS
  Filled 2020-03-03: qty 50

## 2020-03-03 MED ORDER — SODIUM CHLORIDE 0.9 % IV SOLN
Freq: Once | INTRAVENOUS | Status: AC
  Filled 2020-03-03: qty 250

## 2020-03-03 MED ORDER — HEPARIN SOD (PORK) LOCK FLUSH 100 UNIT/ML IV SOLN
INTRAVENOUS | Status: AC
  Filled 2020-03-03: qty 5

## 2020-03-03 MED ORDER — ACETAMINOPHEN 325 MG PO TABS
650.0000 mg | ORAL_TABLET | Freq: Once | ORAL | Status: AC
  Administered 2020-03-03: 650 mg via ORAL
  Filled 2020-03-03: qty 2

## 2020-03-03 MED ORDER — HEPARIN SOD (PORK) LOCK FLUSH 100 UNIT/ML IV SOLN
500.0000 [IU] | Freq: Once | INTRAVENOUS | Status: AC
  Administered 2020-03-03: 500 [IU] via INTRAVENOUS
  Filled 2020-03-03: qty 5

## 2020-03-03 MED ORDER — DOXORUBICIN HCL CHEMO IV INJECTION 2 MG/ML
50.0000 mg/m2 | Freq: Once | INTRAVENOUS | Status: AC
  Administered 2020-03-03: 88 mg via INTRAVENOUS
  Filled 2020-03-03: qty 44

## 2020-03-03 MED ORDER — SODIUM CHLORIDE 0.9% FLUSH
10.0000 mL | Freq: Once | INTRAVENOUS | Status: AC
  Administered 2020-03-03: 10 mL via INTRAVENOUS
  Filled 2020-03-03: qty 10

## 2020-03-03 MED ORDER — DIPHENHYDRAMINE HCL 50 MG/ML IJ SOLN
50.0000 mg | Freq: Once | INTRAMUSCULAR | Status: AC
  Administered 2020-03-03: 50 mg via INTRAVENOUS
  Filled 2020-03-03: qty 1

## 2020-03-03 NOTE — Progress Notes (Signed)
Pt stable at time of discharge. No s/s of distress or reaction noted.

## 2020-03-03 NOTE — Progress Notes (Signed)
Hematology/Oncology follow up note [facility] [phone] [phone]   Patient Care Team: Patient, No Pcp Per as PCP - General (General Practice)  REFERRING PROVIDER: No ref. provider found  CHIEF COMPLAINTS/REASON FOR VISIT:  Follow up for anaplastic large cell lymphoma, ALK postive  HISTORY OF PRESENTING ILLNESS:   Angel Tate is a  29 y.o.  female with PMH listed below was seen in consultation at the request of  No ref. provider found  for evaluation of lymphoma Patient reports feeling right axillary mass in June 2021.  Initially the mass did not bother her.  Patient developed a rash in the right axillary/upper outer quadrant of the breast and had additional work-up.  Patient was treated with a 10-day course of doxycycline for possible infection.  Did not improve. 02/04/2020, targeted ultrasound showed right axillary mass measuring 6.2 x 4 by 4 cm.  There are 2 adjacent smaller axillary mass measuring 2.0 x 1.2 x 1.4 cm and 0.8 x 0.7 x 0.9 cm.  Mammogram showed dense fibroglandular.  No suspicious mass or malignant type microcalcifications or distortion detected in either breast. Patient underwent ultrasound-guided core biopsy of the right axillary mass. 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  Ki-67 80-90% Flow cytometry has insufficient cells for analysis. Patient was referred to establish care for further evaluation and management. Retrospectively, patient recalls some night sweating.  No fever, chills, unintentional weight loss.  She has good energy level and appetite.  She has 2 children. #  Echocardiogram showed normal LVEF Bone marrow biopsy is negative  #Possible side effects of chemotherapy was discussed with patient.  In particular I discussed about infertility.  Patient does not desire to preserve fertility.  She has IUD for contraceptive measures.  INTERVAL HISTORY Angel Tate is a 29  y.o. female who has above history reviewed by me today presents for follow-up of anaplastic large cell lymphoma ALK positive treatment. Patient had acute visit last week due to shortness of breath and low-grade fever.  D-dimer was positive CT chest angiogram was obtained which showed no PE.  03/02/2020 PET scan showed right axillary pathological adenopathy, largest lymph node 3.5 cm with maximum SUV 42.8.  An adjacent smaller peripheral right axillary lymph node measuring 1.3 cm with SUV of 23.3. Anterior mediastinal density favors benign thymic tissue maximum SUV 3.0 which is due for.  5 below the level of axillary lymphadenopathy.  Today patient reports shortness of breath has improved.  No additional fever episodes.  Review of Systems  Constitutional: Negative for appetite change, chills, fatigue and fever.  HENT:   Negative for hearing loss and voice change.   Eyes: Negative for eye problems.  Respiratory: Negative for chest tightness, cough and shortness of breath.   Cardiovascular: Negative for chest pain.  Gastrointestinal: Negative for abdominal distention, abdominal pain and blood in stool.  Endocrine: Negative for hot flashes.       Night sweats  Genitourinary: Negative for difficulty urinating and frequency.   Musculoskeletal: Negative for arthralgias.  Skin: Negative for itching and rash.  Neurological: Negative for extremity weakness.  Hematological: Negative for adenopathy.  Psychiatric/Behavioral: Negative for confusion.    MEDICAL HISTORY:  Past Medical History:  Diagnosis Date  . Anxiety   . Depression   . Medical history non-contributory     SURGICAL HISTORY: Past Surgical History:  Procedure Laterality Date  . DILATION AND CURETTAGE OF UTERUS    . DILATION AND EVACUATION N/A 02/17/2017  Procedure: DILATATION AND EVACUATION;  Surgeon: Janyth Pupa, DO;  Location: [facility];  Service: Gynecology;  Laterality: N/A;  . PORTA CATH INSERTION N/A 02/23/2020    Procedure: PORTA CATH INSERTION;  Surgeon: Algernon Huxley, MD;  Location: [facility];  Service: Cardiovascular;  Laterality: N/A;    SOCIAL HISTORY: Social History   Socioeconomic History  . Marital status: Married    Spouse name: [name]  . Number of children: 1  . Years of education: Not on file  . Highest education level: Not on file  Occupational History  . Occupation: social media- marketing   Tobacco Use  . Smoking status: Never Smoker  . Smokeless tobacco: Never Used  Vaping Use  . Vaping Use: Never used  Substance and Sexual Activity  . Alcohol use: Yes    Comment: socially  . Drug use: No  . Sexual activity: Yes    Birth control/protection: None  Other Topics Concern  . Not on file  Social History Narrative  . Not on file   Social Determinants of Health   Financial Resource Strain:   . Difficulty of Paying Living Expenses: Not on file  Food Insecurity:   . Worried About Charity fundraiser in the Last Year: Not on file  . Ran Out of Food in the Last Year: Not on file  Transportation Needs:   . Lack of Transportation (Medical): Not on file  . Lack of Transportation (Non-Medical): Not on file  Physical Activity:   . Days of Exercise per Week: Not on file  . Minutes of Exercise per Session: Not on file  Stress:   . Feeling of Stress : Not on file  Social Connections:   . Frequency of Communication with Friends and Family: Not on file  . Frequency of Social Gatherings with Friends and Family: Not on file  . Attends Religious Services: Not on file  . Active Member of Clubs or Organizations: Not on file  . Attends Archivist Meetings: Not on file  . Marital Status: Not on file  Intimate Partner Violence:   . Fear of Current or Ex-Partner: Not on file  . Emotionally Abused: Not on file  . Physically Abused: Not on file  . Sexually Abused: Not on file    FAMILY HISTORY: Family History  Problem Relation Age of Onset  . Hypertension Maternal  Grandmother   . Asthma Maternal Aunt   . Ovarian cancer Paternal Grandmother     ALLERGIES:  has No Known Allergies.  MEDICATIONS:  Current Outpatient Medications  Medication Sig Dispense Refill  . allopurinol (ZYLOPRIM) 300 MG tablet Take 1 tablet (300 mg total) by mouth daily. 30 tablet 3  . ibuprofen (ADVIL,MOTRIN) 200 MG tablet Take 3 tablets (600 mg total) by mouth every 6 (six) hours as needed (for pain.). 30 tablet 0  . lidocaine-prilocaine (EMLA) cream Apply to affected area once 30 g 3  . ondansetron (ZOFRAN) 8 MG tablet Take 1 tablet (8 mg total) by mouth 2 (two) times daily as needed for refractory nausea / vomiting. Start on day 3 after cyclophosphamide chemotherapy. 30 tablet 1  . prochlorperazine (COMPAZINE) 10 MG tablet Take 1 tablet (10 mg total) by mouth every 6 (six) hours as needed (Nausea or vomiting). 30 tablet 6  . gabapentin (NEURONTIN) 100 MG capsule Take 1 capsule (100 mg total) by mouth 2 (two) times daily. 30 capsule 0  . oxyCODONE (OXY IR/ROXICODONE) 5 MG immediate release tablet Take 1 tablet (  5 mg total) by mouth every 6 (six) hours as needed for moderate pain or severe pain. 30 tablet 0  . predniSONE (DELTASONE) 20 MG tablet Take 5 tablets (100 mg total) by mouth daily. Take on days 2-5 of chemotherapy. (Patient not taking: Reported on 02/23/2020) 20 tablet 7  . sertraline (ZOLOFT) 100 MG tablet Take 100 mg by mouth at bedtime.  (Patient not taking: Reported on 03/03/2020)  11   No current facility-administered medications for this visit.   Facility-Administered Medications Ordered in Other Visits  Medication Dose Route Frequency Provider Last Rate Last Admin  . brentuximab vedotin (ADCETRIS) 125 mg in sodium chloride 0.9 % 100 mL chemo infusion  1.8 mg/kg (Treatment Plan Recorded) Intravenous Once Earlie Server, MD 250 mL/hr at 03/03/20 1226 125 mg at 03/03/20 1226  . heparin lock flush 100 unit/mL  500 Units Intravenous Once Earlie Server, MD         PHYSICAL  EXAMINATION: ECOG PERFORMANCE STATUS: 0 - Asymptomatic Vitals:   03/03/20 0839  BP: (!) 125/55  Pulse: 76  Resp: 18  Temp: 99 F (37.2 C)   Filed Weights   03/03/20 0839  Weight: 155 lb 11.2 oz (70.6 kg)    Physical Exam Constitutional:      General: She is not in acute distress. HENT:     Head: Normocephalic and atraumatic.  Eyes:     General: No scleral icterus. Cardiovascular:     Rate and Rhythm: Normal rate and regular rhythm.     Heart sounds: Normal heart sounds.  Pulmonary:     Effort: Pulmonary effort is normal. No respiratory distress.     Breath sounds: No wheezing.  Abdominal:     General: Bowel sounds are normal. There is no distension.     Palpations: Abdomen is soft.  Musculoskeletal:        General: No deformity. Normal range of motion.     Cervical back: Normal range of motion and neck supple.  Skin:    General: Skin is warm and dry.     Findings: No erythema or rash.  Neurological:     Mental Status: She is alert and oriented to person, place, and time. Mental status is at baseline.     Cranial Nerves: No cranial nerve deficit.     Coordination: Coordination normal.  Psychiatric:        Mood and Affect: Mood normal.   Right axillary palpable mass LABORATORY DATA:  I have reviewed the data as listed Lab Results  Component Value Date   WBC 6.4 03/03/2020   HGB 11.9 (L) 03/03/2020   HCT 36.6 03/03/2020   MCV 90.8 03/03/2020   PLT 247 03/03/2020   Recent Labs    02/13/20 1146 02/27/20 1259 03/03/20 0824  NA 139 137 137  K 4.2 4.2 4.2  CL 103 104 106  CO2 '27 25 22  ' GLUCOSE 95 113* 98  BUN '12 11 13  ' CREATININE 0.79 0.69 0.63  CALCIUM 9.3 9.5 9.2  GFRNONAA >60 >60 >60  PROT 8.0 7.9 7.4  ALBUMIN 4.5 4.5 4.1  AST 16 17 14*  ALT '13 11 11  ' ALKPHOS 65 57 49  BILITOT 0.5 0.6 0.5   Iron/TIBC/Ferritin/ %Sat No results found for: IRON, TIBC, FERRITIN, IRONPCTSAT    RADIOGRAPHIC STUDIES: I have personally reviewed the radiological  images as listed and agreed with the findings in the report. DG Chest 2 View  Result Date: 02/27/2020 CLINICAL DATA:  Recently diagnosed lymphoma, new  shortness of breath EXAM: CHEST - 2 VIEW COMPARISON:  Contemporary CT angiography of the chest FINDINGS: Soft tissue fullness in the right axillary region compatible with known lymphomatous mass. A right IJ approach Port-A-Cath tip terminates in the lower SVC. No other acute or significant osseous or soft tissue abnormalities. No consolidation, features of edema, pneumothorax, or effusion. The cardiomediastinal contours are unremarkable. IMPRESSION: 1. Soft tissue fullness in the right axillary region compatible with known lymphomatous mass. 2. No acute cardiopulmonary findings. Electronically Signed   By: Lovena Le M.D.   On: 02/27/2020 20:04   CT ANGIO CHEST PE W OR WO CONTRAST  Result Date: 02/27/2020 CLINICAL DATA:  Concern for pulmonary embolism, shortness of breath, increasing today EXAM: CT ANGIOGRAPHY CHEST WITH CONTRAST TECHNIQUE: Multidetector CT imaging of the chest was performed using the standard protocol during bolus administration of intravenous contrast. Multiplanar CT image reconstructions and MIPs were obtained to evaluate the vascular anatomy. CONTRAST:  21m OMNIPAQUE IOHEXOL 350 MG/ML SOLN COMPARISON:  Axillary ultrasound 02/04/2019 FINDINGS: Cardiovascular: Satisfactory opacification the pulmonary arteries to the segmental level. No pulmonary artery filling defects are identified. Central pulmonary arteries are normal caliber. The aortic root is suboptimally assessed given cardiac pulsation artifact. No acute luminal abnormality of the imaged aorta. No periaortic stranding or hemorrhage. Shared origin of the brachiocephalic and left common carotid arteries. Proximal great vessels are unremarkable. Normal heart size. No pericardial effusion. Right IJ approach Port-A-Cath tip terminates at the superior cavoatrial junction.  Mediastinum/Nodes: There is a heterogeneous, partially calcified lesion in the right axillary soft tissues which is difficult to separate from the adjacent pectoralis musculature though is concerning for a site of nodal metas ankle mint tatic disease in the setting of known lymphoma this measures approximately 4.5 x 3.5 cm in size. Several additional adjacent enlarged nodes are present in the right axilla measuring up to 15 mm short axis, likely corresponding to those seen on comparison ultrasound. No contralateral axillary, mediastinal or hilar adenopathy is evident. Wedge-shaped soft tissue attenuation draped across the anterior mediastinum can reflect thymic hyperplasia in the setting of lymphoma treatment. No acute abnormality of the trachea. Small sliding-type hiatal hernia. Thyroid gland is unremarkable as included. Lungs/Pleura: No consolidation, features of edema, pneumothorax, or effusion. No suspicious pulmonary nodules or masses. Upper Abdomen: No acute abnormalities present in the visualized portions of the upper abdomen. Musculoskeletal: No acute osseous abnormality or suspicious osseous lesion. Review of the MIP images confirms the above findings. IMPRESSION: 1. No evidence of acute pulmonary embolism. 2. Heterogeneous, partially calcified lesion in the right axillary soft tissues corresponds to the previously biopsied right axillary mass with pathology demonstrating anaplastic large-cell lymphoma. Several additional adjacent enlarged nodes are present in the right axilla measuring up to 15 mm short axis, likely corresponding to those seen on comparison ultrasound. 3. Wedge-shaped soft tissue attenuation draped across the anterior mediastinum can reflect thymic remnant and or hyperplasia in the setting of lymphoma treatment. 4. Small sliding-type hiatal hernia. Electronically Signed   By: PLovena LeM.D.   On: 02/27/2020 20:02   PERIPHERAL VASCULAR CATHETERIZATION  Result Date: 02/23/2020 See op  note  NM PET Image Initial (PI) Skull Base To Thigh  Result Date: 03/02/2020 CLINICAL DATA:  Initial treatment strategy for large cell lymphoma. EXAM: NUCLEAR MEDICINE PET SKULL BASE TO THIGH TECHNIQUE: 7.3 mCi F-18 FDG was injected intravenously. Full-ring PET imaging was performed from the skull base to thigh after the radiotracer. CT data was obtained and used for attenuation  correction and anatomic localization. Fasting blood glucose: 95 mg/dl COMPARISON:  Chest CT 02/27/2020 FINDINGS: Mediastinal blood pool activity: SUV max 1.7 Liver activity: SUV max 2.0 NECK: No significant abnormal hypermetabolic activity in this region. Incidental CT findings: none CHEST: Right axillary adenopathy noted, the largest lymph node is 3.5 cm in short axis on image 79 of series 3 with a maximum SUV of 42.8 (Deauville 5). An adjacent smaller peripheral right axillary lymph node measuring 1.3 cm in short axis on image 83 of series 3 has maximum SUV of 23.3 (Deauville 5). Anterior mediastinal soft tissue density resembling thymic tissue noted, maximum SUV 3.0 which is Deauville 4. This probably represents residual uninvolved thymic tissue given the large difference between this appearance and the axillary adenopathy. Incidental CT findings: Right Port-A-Cath tip: Right atrium. ABDOMEN/PELVIS: No significant abnormal hypermetabolic activity in this region. No splenomegaly or focal splenic lesion. IUD noted along the endometrium. Incidental CT findings: No significant abnormal hypermetabolic activity in this region. SKELETON: No significant abnormal hypermetabolic activity in this region. Incidental CT findings: none IMPRESSION: 1. Right axillary pathologic adenopathy, maximum SUV 42.8 (Deauville 5). 2. Anterior mediastinal density favors benign thymic tissue, maximum SUV 3.0 which is Deauville 4, but far below the level of the axillary adenopathy. Electronically Signed   By: Van Clines M.D.   On: 03/02/2020 11:46   CT  BONE MARROW BIOPSY & ASPIRATION  Result Date: 02/17/2020 INDICATION: Recent diagnosis of lymphoma. Please perform CT-guided bone marrow biopsy for tissue diagnostic purposes. EXAM: CT-GUIDED BONE MARROW BIOPSY AND ASPIRATION MEDICATIONS: None ANESTHESIA/SEDATION: Fentanyl 2 mcg IV; Versed 100 mg IV Sedation Time: 10 Minutes; The patient was continuously monitored during the procedure by the interventional radiology nurse under my direct supervision. COMPLICATIONS: None immediate. PROCEDURE: Informed consent was obtained from the patient following an explanation of the procedure, risks, benefits and alternatives. The patient understands, agrees and consents for the procedure. All questions were addressed. A time out was performed prior to the initiation of the procedure. The patient was positioned prone and non-contrast localization CT was performed of the pelvis to demonstrate the iliac marrow spaces. The operative site was prepped and draped in the usual sterile fashion. Under sterile conditions and local anesthesia, a 22 gauge spinal needle was utilized for procedural planning. Next, an 11 gauge coaxial bone biopsy needle was advanced into the left iliac marrow space. Needle position was confirmed with CT imaging. Initially, a bone marrow aspiration was performed. Next, a bone marrow biopsy was obtained with the 11 gauge outer bone marrow device. The 11 gauge coaxial bone biopsy needle was re-advanced into a slightly different location within the left iliac marrow space, positioning was confirmed with CT imaging and an additional bone marrow biopsy was obtained. The needle was removed and superficial hemostasis was obtained with manual compression. A dressing was applied. The patient tolerated the procedure well without immediate post procedural complication. IMPRESSION: Successful CT guided left iliac bone marrow aspiration and core biopsy. Electronically Signed   By: Sandi Mariscal M.D.   On: 02/17/2020 11:01    MM DIAG BREAST TOMO BILATERAL  Result Date: 02/04/2020 CLINICAL DATA:  29 year old female complaining of a palpable right axillary mass. Patient states that she has palpated the abnormality since June of 2021 and more recently developed a rash in the right axilla/upper outer quadrant of the breast. Patient has not received the COVID-19 vaccination and she has no underlying medical conditions. EXAM: DIGITAL DIAGNOSTIC BILATERAL MAMMOGRAM WITH CAD AND TOMO ULTRASOUND RIGHT  BREAST COMPARISON:  None. ACR Breast Density Category c: The breast tissue is heterogeneously dense, which may obscure small masses. FINDINGS: I palpate fullness in the right axilla. There is erythema or non raised rash in the upper outer quadrant/right axilla. Targeted ultrasound shows a right axillary mass measuring 6.2 x 4.0 x 4.0 cm. There are 2 adjacent smaller axillary masses measuring 2.0 x 1.2 x 1.4 cm and 0.8 x 0.7 x 0.9 cm. Two view mammogram shows dense fibroglandular tissue. No suspicious mass, malignant type microcalcifications or distortion detected in either breast. Mammographic images were processed with CAD. IMPRESSION: Suspicious right axillary adenopathy. RECOMMENDATION: Ultrasound-guided core biopsy of the largest right axillary mass is recommended. Biopsy has been scheduled on 02/11/2020. The patient was placed on a 10 day course of doxycycline 100 mg p.o. b.i.d. for the possible rash in the upper-outer quadrant/right axilla. I have discussed the findings and recommendations with the patient. If applicable, a reminder letter will be sent to the patient regarding the next appointment. BI-RADS CATEGORY  4: Suspicious. Electronically Signed   By: Lillia Mountain M.D.   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 Core biopsy reveals sheets of  large atypical lymphoid cells with frequent mitotic figures and apoptotic debris. Flow cytometry has insufficient cells for analysis. 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 This was found to be concordant by Dr. Hassan Rowan. The patient reported doing well on 02/05/20 after the biopsy with tenderness at the site. Post biopsy instructions and care were reviewed and questions were answered. The patient was encouraged to call [facility] for any additional concerns. Results and recommendations called and faxed to [facility] LPN with [facility] and Gynecology on 02/06/20. Dr. Delice Bison NP will arrange Hematology/Oncology referral per patient request. Pathology results reported by Stacie Acres RN on 02/06/2020. Electronically Signed   By: Margarette Canada M.D.   On: 02/06/2020 12:21   Result Date: 02/06/2020 CLINICAL DATA:  29 year old female for tissue sampling of 6.2 cm RIGHT axillary lymph node/mass. EXAM: [place] AXILLARY NODE CORE BIOPSY RIGHT COMPARISON:  Previous exam(s). PROCEDURE: I met with the patient and we discussed the procedure of ultrasound-guided biopsy, including benefits and alternatives. We discussed the high likelihood of a successful procedure. We discussed the risks of the procedure, including infection, bleeding, tissue injury, clip migration, and inadequate sampling. Informed written consent was given. The usual time-out protocol was performed immediately prior to the procedure. Using sterile technique and 1% Lidocaine as local anesthetic, under direct ultrasound visualization, a 12 gauge spring-loaded device was used to perform biopsy of the 6.2 cm RIGHT axillary lymph node/mass using a LATERAL approach. Specimens were placed in formalin and saline. At the conclusion of the procedure [facility] tissue marker clip was deployed into the biopsy cavity, with satisfactory placement confirmed sonographically. IMPRESSION: Ultrasound  guided biopsy of 6.2 cm RIGHT axillary lymph node/mass. No apparent complications. Electronically Signed: By: Margarette Canada M.D. On: 02/04/2020 14:48   [place] AXILLA RIGHT  Result Date: 02/04/2020 CLINICAL DATA:  29 year old female complaining of a palpable right axillary mass. Patient states that she has palpated the abnormality since June of 2021 and more recently developed a rash in the right axilla/upper outer quadrant of the breast. Patient has not received the COVID-19 vaccination and she has no  underlying medical conditions. EXAM: DIGITAL DIAGNOSTIC BILATERAL MAMMOGRAM WITH CAD AND TOMO ULTRASOUND RIGHT BREAST COMPARISON:  None. ACR Breast Density Category c: The breast tissue is heterogeneously dense, which may obscure small masses. FINDINGS: I palpate fullness in the right axilla. There is erythema or non raised rash in the upper outer quadrant/right axilla. Targeted ultrasound shows a right axillary mass measuring 6.2 x 4.0 x 4.0 cm. There are 2 adjacent smaller axillary masses measuring 2.0 x 1.2 x 1.4 cm and 0.8 x 0.7 x 0.9 cm. Two view mammogram shows dense fibroglandular tissue. No suspicious mass, malignant type microcalcifications or distortion detected in either breast. Mammographic images were processed with CAD. IMPRESSION: Suspicious right axillary adenopathy. RECOMMENDATION: Ultrasound-guided core biopsy of the largest right axillary mass is recommended. Biopsy has been scheduled on 02/11/2020. The patient was placed on a 10 day course of doxycycline 100 mg p.o. b.i.d. for the possible rash in the upper-outer quadrant/right axilla. I have discussed the findings and recommendations with the patient. If applicable, a reminder letter will be sent to the patient regarding the next appointment. BI-RADS CATEGORY  4: Suspicious. Electronically Signed   By: Lillia Mountain M.D.   On: 02/04/2020 13:48   ECHOCARDIOGRAM LIMITED  Result Date: 02/18/2020    ECHOCARDIOGRAM LIMITED REPORT   Patient Name:    Angel Tate Date of Exam: 02/18/2020 Medical Rec #:  [id number]          Height:       64.0 in Accession #:    [id number]         Weight:       153.0 lb Date of Birth:  11-09-1990          BSA:          1.746 m Patient Age:    29 years           BP:           104/62 mmHg Patient Gender: F                  HR:           75 bpm. Exam Location:  [facility] Procedure: Limited Echo, Limited Color Doppler and Cardiac Doppler Indications:     Lymphoma  History:         Patient has no prior history of Echocardiogram examinations.                  Lymphoma.  Sonographer:     Charmayne Sheer RDCS (AE) Referring Phys:  [id number] Jaicob Dia Diagnosing Phys: Kate Sable MD IMPRESSIONS  1. Left ventricular ejection fraction, by estimation, is 60 to 65%. The left ventricle has normal function. The left ventricle has no regional wall motion abnormalities. Left ventricular diastolic function could not be evaluated.  2. Right ventricular systolic function is normal. The right ventricular size is normal.  3. The mitral valve is normal in structure. No evidence of mitral valve regurgitation. No evidence of mitral stenosis.  4. The aortic valve is normal in structure. Aortic valve regurgitation is not visualized. No aortic stenosis is present.  5. The inferior vena cava is normal in size with greater than 50% respiratory variability, suggesting right atrial pressure of 3 mmHg. FINDINGS  Left Ventricle: Left ventricular ejection fraction, by estimation, is 60 to 65%. The left ventricle has normal function. The left ventricle has no regional wall motion abnormalities. The left ventricular internal cavity size was normal in size. There is  no  left ventricular hypertrophy. Left ventricular diastolic function could not be evaluated. Right Ventricle: The right ventricular size is normal. No increase in right ventricular wall thickness. Right ventricular systolic function is normal. Left Atrium: Left atrial size was normal in size. Right Atrium:  Right atrial size was normal in size. Pericardium: There is no evidence of pericardial effusion. Mitral Valve: The mitral valve is normal in structure. No evidence of mitral valve stenosis. Tricuspid Valve: The tricuspid valve is normal in structure. Tricuspid valve regurgitation is not demonstrated. No evidence of tricuspid stenosis. Aortic Valve: The aortic valve is normal in structure. Aortic valve regurgitation is not visualized. No aortic stenosis is present. Pulmonic Valve: The pulmonic valve was normal in structure. Pulmonic valve regurgitation is not visualized. No evidence of pulmonic stenosis. Aorta: The aortic root is normal in size and structure. Venous: The inferior vena cava is normal in size with greater than 50% respiratory variability, suggesting right atrial pressure of 3 mmHg. IAS/Shunts: No atrial level shunt detected by color flow Doppler. LEFT VENTRICLE PLAX 2D LVIDd:         4.74 cm LVIDs:         3.31 cm LV PW:         1.02 cm LV IVS:        0.80 cm LVOT diam:     2.20 cm LVOT Area:     3.80 cm  LEFT ATRIUM         Index LA diam:    3.50 cm 2.00 cm/m   AORTA Ao Root diam: 2.80 cm  SHUNTS Systemic Diam: 2.20 cm Kate Sable MD Electronically signed by Kate Sable MD Signature Date/Time: 02/18/2020/5:10:09 PM    Final       ASSESSMENT & PLAN:  1. 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 Neoplasm related pain   3. Goals of care, counseling/discussion   4. Encounter for antineoplastic chemotherapy    #Right axillary anaplastic ALK positive large cell lymphoma PET scan was independent reviewed by me and discussed with patient. Findings were consistent with stage I lymphoma.  There is some increased SUV activity in the mediastinum which was believed to be thymic tissue.  Attention on follow-up. I reviewed NCCN guideline for treatment of stage I anaplastic large cell lymphoma. Recommend systemic BV+ CHP for 4-6 cycles and repeat PET  scan after 4 cycles of treatments. Possible side effects were discussed with patient in details. Proceed with cycle 1B BV CHP today. Continue allopurinol for tumor lysis syndrome prophylaxis. Patient will take prednisone 100 mg day 2-5 Patient will have Udenyca for G-CSF support on day 3 with IV fluid.  Side effects of Udenyca was discussed with patient.  Patient will take Claritin for 4 days for the first cycle I will also repeat BMP, phosphorus.  Encourage oral hydration.  #Goals of care, curative intent #Neoplasm related pain, patient has tried Tylenol 3 with no symptom relief. Recommend to switch to oxycodone 5 mg every 6 hours as needed for pain Add gabapentin 100 mg twice daily. Recommend patient to avoid driving immediately after taking narcotics or gabapentin.   Supportive care measures are necessary for patient well-being and will be provided as necessary. We spent sufficient time to discuss many aspect of care, questions were answered to patient's satisfaction.    No orders of the defined types were placed in this encounter.   All questions were answered. The patient knows to call the clinic with any  problems questions or concerns.  cc No ref. provider found    Return visit as currently scheduled. Earlie Server, MD, PhD Hematology Oncology [facility] Pager- [phone] 03/03/2020

## 2020-03-03 NOTE — Progress Notes (Signed)
Patient here for follow up and new treatment. Pt reports continuous right arm pain, Tylenol # not helping.

## 2020-03-05 ENCOUNTER — Inpatient Hospital Stay: Payer: BC Managed Care – PPO

## 2020-03-05 ENCOUNTER — Other Ambulatory Visit: Payer: Self-pay

## 2020-03-05 VITALS — BP 101/67 | HR 75 | Temp 98.5°F

## 2020-03-05 DIAGNOSIS — Z5111 Encounter for antineoplastic chemotherapy: Secondary | ICD-10-CM | POA: Diagnosis not present

## 2020-03-05 DIAGNOSIS — C8464 Anaplastic large cell lymphoma, ALK-positive, lymph nodes of axilla and upper limb: Secondary | ICD-10-CM

## 2020-03-05 LAB — BASIC METABOLIC PANEL
Anion gap: 8 (ref 5–15)
BUN: 14 mg/dL (ref 6–20)
CO2: 23 mmol/L (ref 22–32)
Calcium: 8.9 mg/dL (ref 8.9–10.3)
Chloride: 105 mmol/L (ref 98–111)
Creatinine, Ser: 0.59 mg/dL (ref 0.44–1.00)
GFR, Estimated: >60 mL/min (ref >60)
Glucose, Bld: 166 mg/dL — ABNORMAL HIGH (ref 70–99)
Potassium: 3.8 mmol/L (ref 3.5–5.1)
Sodium: 136 mmol/L (ref 135–145)

## 2020-03-05 LAB — PHOSPHORUS: Phosphorus: 3.1 mg/dL (ref 2.5–4.6)

## 2020-03-05 LAB — URIC ACID: Uric Acid, Serum: 3.1 mg/dL (ref 2.5–7.1)

## 2020-03-05 MED ORDER — PEGFILGRASTIM-CBQV 6 MG/0.6ML ~~LOC~~ SOSY
6.0000 mg | PREFILLED_SYRINGE | Freq: Once | SUBCUTANEOUS | Status: AC
  Administered 2020-03-05: 6 mg via SUBCUTANEOUS
  Filled 2020-03-05: qty 0.6

## 2020-03-05 MED ORDER — HEPARIN SOD (PORK) LOCK FLUSH 100 UNIT/ML IV SOLN
500.0000 [IU] | Freq: Once | INTRAVENOUS | Status: AC
  Administered 2020-03-05: 500 [IU] via INTRAVENOUS
  Filled 2020-03-05: qty 5

## 2020-03-05 MED ORDER — HEPARIN SOD (PORK) LOCK FLUSH 100 UNIT/ML IV SOLN
INTRAVENOUS | Status: AC
  Filled 2020-03-05: qty 5

## 2020-03-05 MED ORDER — SODIUM CHLORIDE 0.9 % IV SOLN
INTRAVENOUS | Status: DC
  Filled 2020-03-05 (×2): qty 250

## 2020-03-05 NOTE — Progress Notes (Signed)
Pt tolerated infusion well with no complications. Pt stable for discharge. VSS. Pt discharged home.   Priya Matsen [employer]

## 2020-03-08 ENCOUNTER — Ambulatory Visit: Payer: BC Managed Care – PPO | Admitting: Oncology

## 2020-03-08 ENCOUNTER — Telehealth: Payer: Self-pay

## 2020-03-08 ENCOUNTER — Other Ambulatory Visit: Payer: BC Managed Care – PPO

## 2020-03-08 ENCOUNTER — Ambulatory Visit: Payer: BC Managed Care – PPO

## 2020-03-08 NOTE — Telephone Encounter (Signed)
T/C to pt for follow up after receiving first chemo.  No answer but left message letting her know we were calling to check on her.  Encouraged to call for any questions or concerns.

## 2020-03-09 ENCOUNTER — Ambulatory Visit: Payer: BC Managed Care – PPO

## 2020-03-09 ENCOUNTER — Other Ambulatory Visit: Payer: BC Managed Care – PPO

## 2020-03-09 ENCOUNTER — Ambulatory Visit: Payer: BC Managed Care – PPO | Admitting: Oncology

## 2020-03-10 ENCOUNTER — Ambulatory Visit: Payer: BC Managed Care – PPO

## 2020-03-11 ENCOUNTER — Encounter: Payer: Self-pay | Admitting: Oncology

## 2020-03-11 ENCOUNTER — Other Ambulatory Visit: Payer: Self-pay

## 2020-03-11 ENCOUNTER — Ambulatory Visit: Payer: BC Managed Care – PPO

## 2020-03-11 ENCOUNTER — Inpatient Hospital Stay (HOSPITAL_BASED_OUTPATIENT_CLINIC_OR_DEPARTMENT_OTHER): Payer: BC Managed Care – PPO | Admitting: Oncology

## 2020-03-11 ENCOUNTER — Inpatient Hospital Stay: Payer: BC Managed Care – PPO

## 2020-03-11 VITALS — BP 101/70 | HR 93 | Temp 99.0°F | Resp 18 | Wt 145.0 lb

## 2020-03-11 DIAGNOSIS — T451X5A Adverse effect of antineoplastic and immunosuppressive drugs, initial encounter: Secondary | ICD-10-CM

## 2020-03-11 DIAGNOSIS — R110 Nausea: Secondary | ICD-10-CM

## 2020-03-11 DIAGNOSIS — K521 Toxic gastroenteritis and colitis: Secondary | ICD-10-CM | POA: Diagnosis not present

## 2020-03-11 DIAGNOSIS — Z5111 Encounter for antineoplastic chemotherapy: Secondary | ICD-10-CM | POA: Diagnosis not present

## 2020-03-11 DIAGNOSIS — C8464 Anaplastic large cell lymphoma, ALK-positive, lymph nodes of axilla and upper limb: Secondary | ICD-10-CM | POA: Diagnosis not present

## 2020-03-11 DIAGNOSIS — G893 Neoplasm related pain (acute) (chronic): Secondary | ICD-10-CM | POA: Diagnosis not present

## 2020-03-11 LAB — CBC WITH DIFFERENTIAL/PLATELET
Abs Immature Granulocytes: 0.2 10*3/uL — ABNORMAL HIGH (ref 0.00–0.07)
Band Neutrophils: 2 %
Basophils Absolute: 0 10*3/uL (ref 0.0–0.1)
Basophils Relative: 1 %
Eosinophils Absolute: 0 10*3/uL (ref 0.0–0.5)
Eosinophils Relative: 0 %
HCT: 38 % (ref 36.0–46.0)
Hemoglobin: 12.8 g/dL (ref 12.0–15.0)
Lymphocytes Relative: 26 %
Lymphs Abs: 1.3 10*3/uL (ref 0.7–4.0)
MCH: 30.1 pg (ref 26.0–34.0)
MCHC: 33.7 g/dL (ref 30.0–36.0)
MCV: 89.4 fL (ref 80.0–100.0)
Metamyelocytes Relative: 2 %
Monocytes Absolute: 0.8 10*3/uL (ref 0.1–1.0)
Monocytes Relative: 16 %
Myelocytes: 2 %
Neutro Abs: 2.5 10*3/uL (ref 1.7–7.7)
Neutrophils Relative %: 50 %
Other: 1 %
Platelets: 168 10*3/uL (ref 150–400)
RBC: 4.25 MIL/uL (ref 3.87–5.11)
RDW: 12.6 % (ref 11.5–15.5)
Smear Review: ADEQUATE
WBC: 4.9 10*3/uL (ref 4.0–10.5)
nRBC: 0.4 % — ABNORMAL HIGH (ref 0.0–0.2)

## 2020-03-11 LAB — COMPREHENSIVE METABOLIC PANEL
ALT: 21 U/L (ref 0–44)
AST: 21 U/L (ref 15–41)
Albumin: 4.5 g/dL (ref 3.5–5.0)
Alkaline Phosphatase: 65 U/L (ref 38–126)
Anion gap: 9 (ref 5–15)
BUN: 10 mg/dL (ref 6–20)
CO2: 26 mmol/L (ref 22–32)
Calcium: 9.5 mg/dL (ref 8.9–10.3)
Chloride: 102 mmol/L (ref 98–111)
Creatinine, Ser: 0.54 mg/dL (ref 0.44–1.00)
GFR, Estimated: >60 mL/min (ref >60)
Glucose, Bld: 124 mg/dL — ABNORMAL HIGH (ref 70–99)
Potassium: 4 mmol/L (ref 3.5–5.1)
Sodium: 137 mmol/L (ref 135–145)
Total Bilirubin: 0.5 mg/dL (ref 0.3–1.2)
Total Protein: 7.6 g/dL (ref 6.5–8.1)

## 2020-03-11 LAB — MAGNESIUM: Magnesium: 2.1 mg/dL (ref 1.7–2.4)

## 2020-03-11 LAB — URIC ACID: Uric Acid, Serum: 3.6 mg/dL (ref 2.5–7.1)

## 2020-03-11 LAB — PREGNANCY, URINE: Preg Test, Ur: NEGATIVE

## 2020-03-11 LAB — PATHOLOGIST SMEAR REVIEW

## 2020-03-11 LAB — PHOSPHORUS: Phosphorus: 3.1 mg/dL (ref 2.5–4.6)

## 2020-03-11 MED ORDER — HEPARIN SOD (PORK) LOCK FLUSH 100 UNIT/ML IV SOLN
INTRAVENOUS | Status: AC
  Filled 2020-03-11: qty 5

## 2020-03-11 MED ORDER — SODIUM CHLORIDE 0.9 % IV SOLN
Freq: Once | INTRAVENOUS | Status: AC
  Filled 2020-03-11: qty 250

## 2020-03-11 MED ORDER — SODIUM CHLORIDE 0.9% FLUSH
10.0000 mL | Freq: Once | INTRAVENOUS | Status: AC
  Administered 2020-03-11: 10 mL via INTRAVENOUS
  Filled 2020-03-11: qty 10

## 2020-03-11 MED ORDER — LOPERAMIDE HCL 2 MG PO CAPS: 2.0000 mg | ORAL_CAPSULE | ORAL | 1 refills | Status: AC

## 2020-03-11 MED ORDER — PROMETHAZINE HCL 25 MG PO TABS: 25.0000 mg | ORAL_TABLET | Freq: Four times a day (QID) | ORAL | 2 refills | Status: AC | PRN

## 2020-03-11 MED ORDER — HEPARIN SOD (PORK) LOCK FLUSH 100 UNIT/ML IV SOLN
500.0000 [IU] | Freq: Once | INTRAVENOUS | Status: AC
  Administered 2020-03-11: 500 [IU] via INTRAVENOUS
  Filled 2020-03-11: qty 5

## 2020-03-11 NOTE — Progress Notes (Signed)
Hematology/Oncology follow up note [facility] Telephone:([phone] [phone]   Patient Care Team: Patient, No Pcp Per as PCP - General (General Practice)  REFERRING PROVIDER: No ref. provider found  CHIEF COMPLAINTS/REASON FOR VISIT:  Follow up for anaplastic large cell lymphoma, ALK postive  HISTORY OF PRESENTING ILLNESS:   Robi Mitter is a  29 y.o.  female with PMH listed below was seen in consultation at the request of  No ref. provider found  for evaluation of lymphoma Patient reports feeling right axillary mass in June 2021.  Initially the mass did not bother her.  Patient developed a rash in the right axillary/upper outer quadrant of the breast and had additional work-up.  Patient was treated with a 10-day course of doxycycline for possible infection.  Did not improve. 02/04/2020, targeted ultrasound showed right axillary mass measuring 6.2 x 4 by 4 cm.  There are 2 adjacent smaller axillary mass measuring 2.0 x 1.2 x 1.4 cm and 0.8 x 0.7 x 0.9 cm.  Mammogram showed dense fibroglandular.  No suspicious mass or malignant type microcalcifications or distortion detected in either breast. Patient underwent ultrasound-guided core biopsy of the right axillary mass. 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  Ki-67 80-90% Flow cytometry has insufficient cells for analysis. Patient was referred to establish care for further evaluation and management. Retrospectively, patient recalls some night sweating.  No fever, chills, unintentional weight loss.  She has good energy level and appetite.  She has 2 children. #  Echocardiogram showed normal LVEF Bone marrow biopsy is negative  #Possible side effects of chemotherapy was discussed with patient.  In particular I discussed about infertility.  Patient does not desire to preserve fertility.  She has IUD for contraceptive measures.  #  03/02/2020 PET scan showed right axillary  pathological adenopathy, largest lymph node 3.5 cm with maximum SUV 42.8.  An adjacent smaller peripheral right axillary lymph node measuring 1.3 cm with SUV of 23.3. Anterior mediastinal density favors benign thymic tissue maximum SUV 3.0 which is due [place] 4, far below the level of axillary lymphadenopathy.   INTERVAL HISTORY Scott Fix is a 29 y.o. female who has above history reviewed by me today presents for follow-up of anaplastic large cell lymphoma ALK positive treatment. Patient reports decreased appetite with some nausea.  Also intermittent diarrhea episodes. Patient took Imodium AD which helped the diarrhea.  Antiemetics did not help. Also increased lower back pain and she wants to know if okay to take oxycodone.  Right axillary mass pain has improved.   Review of Systems  Constitutional: Negative for appetite change, chills, fatigue and fever.  HENT:   Negative for hearing loss and voice change.   Eyes: Negative for eye problems.  Respiratory: Negative for chest tightness, cough and shortness of breath.   Cardiovascular: Negative for chest pain.  Gastrointestinal: Negative for abdominal distention, abdominal pain and blood in stool.  Endocrine: Negative for hot flashes.  Genitourinary: Negative for difficulty urinating and frequency.   Musculoskeletal: Negative for arthralgias.  Skin: Negative for itching and rash.  Neurological: Negative for extremity weakness.  Hematological: Negative for adenopathy.  Psychiatric/Behavioral: Negative for confusion.    MEDICAL HISTORY:  Past Medical History:  Diagnosis Date  . Anxiety   . Depression   . Medical history non-contributory     SURGICAL HISTORY: Past Surgical History:  Procedure Laterality Date  . DILATION AND CURETTAGE OF UTERUS    . DILATION AND EVACUATION N/A 02/17/2017  Procedure: DILATATION AND EVACUATION;  Surgeon: Janyth Pupa, DO;  Location: [place] [facility];  Service: Gynecology;  Laterality: N/A;  .  PORTA CATH INSERTION N/A 02/23/2020   Procedure: PORTA CATH INSERTION;  Surgeon: Algernon Huxley, MD;  Location: [place] [facility];  Service: Cardiovascular;  Laterality: N/A;    SOCIAL HISTORY: Social History   Socioeconomic History  . Marital status: Married    Spouse name: [name]  . Number of children: 1  . Years of education: Not on file  . Highest education level: Not on file  Occupational History  . Occupation: social media- marketing   Tobacco Use  . Smoking status: Never Smoker  . Smokeless tobacco: Never Used  Vaping Use  . Vaping Use: Never used  Substance and Sexual Activity  . Alcohol use: Yes    Comment: socially  . Drug use: No  . Sexual activity: Yes    Birth control/protection: None  Other Topics Concern  . Not on file  Social History Narrative  . Not on file   Social Determinants of Health   Financial Resource Strain:   . Difficulty of Paying Living Expenses: Not on file  Food Insecurity:   . Worried About Charity fundraiser in the Last Year: Not on file  . Ran Out of Food in the Last Year: Not on file  Transportation Needs:   . Lack of Transportation (Medical): Not on file  . Lack of Transportation (Non-Medical): Not on file  Physical Activity:   . Days of Exercise per Week: Not on file  . Minutes of Exercise per Session: Not on file  Stress:   . Feeling of Stress : Not on file  Social Connections:   . Frequency of Communication with Friends and Family: Not on file  . Frequency of Social Gatherings with Friends and Family: Not on file  . Attends Religious Services: Not on file  . Active Member of Clubs or Organizations: Not on file  . Attends Archivist Meetings: Not on file  . Marital Status: Not on file  Intimate Partner Violence:   . Fear of Current or Ex-Partner: Not on file  . Emotionally Abused: Not on file  . Physically Abused: Not on file  . Sexually Abused: Not on file    FAMILY HISTORY: Family History  Problem Relation  Age of Onset  . Hypertension Maternal Grandmother   . Asthma Maternal Aunt   . Ovarian cancer Paternal Grandmother     ALLERGIES:  has No Known Allergies.  MEDICATIONS:  Current Outpatient Medications  Medication Sig Dispense Refill  . allopurinol (ZYLOPRIM) 300 MG tablet Take 1 tablet (300 mg total) by mouth daily. 30 tablet 3  . gabapentin (NEURONTIN) 100 MG capsule Take 1 capsule (100 mg total) by mouth 2 (two) times daily. 30 capsule 0  . ibuprofen (ADVIL,MOTRIN) 200 MG tablet Take 3 tablets (600 mg total) by mouth every 6 (six) hours as needed (for pain.). 30 tablet 0  . lidocaine-prilocaine (EMLA) cream Apply to affected area once 30 g 3  . ondansetron (ZOFRAN) 8 MG tablet Take 1 tablet (8 mg total) by mouth 2 (two) times daily as needed for refractory nausea / vomiting. Start on day 3 after cyclophosphamide chemotherapy. 30 tablet 1  . oxyCODONE (OXY IR/ROXICODONE) 5 MG immediate release tablet Take 1 tablet (5 mg total) by mouth every 6 (six) hours as needed for moderate pain or severe pain. 30 tablet 0  . predniSONE (DELTASONE) 20 MG tablet  Take 5 tablets (100 mg total) by mouth daily. Take on days 2-5 of chemotherapy. 20 tablet 7  . prochlorperazine (COMPAZINE) 10 MG tablet Take 1 tablet (10 mg total) by mouth every 6 (six) hours as needed (Nausea or vomiting). 30 tablet 6  . loperamide (IMODIUM) 2 MG capsule Take 1 capsule (2 mg total) by mouth See admin instructions. Initial: 4 mg, followed by 2 mg every 2 to 4 hours or after each loose stool; Max 16 mg /24 hours 60 capsule 1  . promethazine (PHENERGAN) 25 MG tablet Take 1 tablet (25 mg total) by mouth every 6 (six) hours as needed for nausea or vomiting. 120 tablet 2   No current facility-administered medications for this visit.     PHYSICAL EXAMINATION: ECOG PERFORMANCE STATUS: 1 - Symptomatic but completely ambulatory Vitals:   03/11/20 1059  BP: 101/70  Pulse: 93  Resp: 18  Temp: 99 F (37.2 C)   Filed Weights    03/11/20 1059  Weight: 145 lb (65.8 kg)    Physical Exam Constitutional:      General: She is not in acute distress. HENT:     Head: Normocephalic and atraumatic.  Eyes:     General: No scleral icterus. Cardiovascular:     Rate and Rhythm: Normal rate and regular rhythm.     Heart sounds: Normal heart sounds.  Pulmonary:     Effort: Pulmonary effort is normal. No respiratory distress.     Breath sounds: No wheezing.  Abdominal:     General: Bowel sounds are normal. There is no distension.     Palpations: Abdomen is soft.  Musculoskeletal:        General: No deformity. Normal range of motion.     Cervical back: Normal range of motion and neck supple.  Skin:    General: Skin is warm and dry.     Findings: No erythema or rash.  Neurological:     Mental Status: She is alert and oriented to person, place, and time. Mental status is at baseline.     Cranial Nerves: No cranial nerve deficit.     Coordination: Coordination normal.  Psychiatric:        Mood and Affect: Mood normal.   Right axillary palpable mass appears smaller LABORATORY DATA:  I have reviewed the data as listed Lab Results  Component Value Date   WBC 4.9 03/11/2020   HGB 12.8 03/11/2020   HCT 38.0 03/11/2020   MCV 89.4 03/11/2020   PLT 168 03/11/2020   Recent Labs    02/27/20 1259 02/27/20 1259 03/03/20 0824 03/05/20 1352 03/11/20 1023  NA 137   < > 137 136 137  K 4.2   < > 4.2 3.8 4.0  CL 104   < > 106 105 102  CO2 25   < > '22 23 26  ' GLUCOSE 113*   < > 98 166* 124*  BUN 11   < > '13 14 10  ' CREATININE 0.69   < > 0.63 0.59 0.54  CALCIUM 9.5   < > 9.2 8.9 9.5  GFRNONAA >60   < > >60 >60 >60  PROT 7.9  --  7.4  --  7.6  ALBUMIN 4.5  --  4.1  --  4.5  AST 17  --  14*  --  21  ALT 11  --  11  --  21  ALKPHOS 57  --  49  --  65  BILITOT 0.6  --  0.5  --  0.5   < > = values in this interval not displayed.   Iron/TIBC/Ferritin/ %Sat No results found for: IRON, TIBC, FERRITIN, IRONPCTSAT     RADIOGRAPHIC STUDIES: I have personally reviewed the radiological images as listed and agreed with the findings in the report. DG Chest 2 View  Result Date: 02/27/2020 CLINICAL DATA:  Recently diagnosed lymphoma, new shortness of breath EXAM: CHEST - 2 VIEW COMPARISON:  Contemporary CT angiography of the chest FINDINGS: Soft tissue fullness in the right axillary region compatible with known lymphomatous mass. A right IJ approach Port-A-Cath tip terminates in the lower SVC. No other acute or significant osseous or soft tissue abnormalities. No consolidation, features of edema, pneumothorax, or effusion. The cardiomediastinal contours are unremarkable. IMPRESSION: 1. Soft tissue fullness in the right axillary region compatible with known lymphomatous mass. 2. No acute cardiopulmonary findings. Electronically Signed   By: Lovena Le M.D.   On: 02/27/2020 20:04   CT ANGIO CHEST PE W OR WO CONTRAST  Result Date: 02/27/2020 CLINICAL DATA:  Concern for pulmonary embolism, shortness of breath, increasing today EXAM: CT ANGIOGRAPHY CHEST WITH CONTRAST TECHNIQUE: Multidetector CT imaging of the chest was performed using the standard protocol during bolus administration of intravenous contrast. Multiplanar CT image reconstructions and MIPs were obtained to evaluate the vascular anatomy. CONTRAST:  69m OMNIPAQUE IOHEXOL 350 MG/ML SOLN COMPARISON:  Axillary ultrasound 02/04/2019 FINDINGS: Cardiovascular: Satisfactory opacification the pulmonary arteries to the segmental level. No pulmonary artery filling defects are identified. Central pulmonary arteries are normal caliber. The aortic root is suboptimally assessed given cardiac pulsation artifact. No acute luminal abnormality of the imaged aorta. No periaortic stranding or hemorrhage. Shared origin of the brachiocephalic and left common carotid arteries. Proximal great vessels are unremarkable. Normal heart size. No pericardial effusion. Right IJ approach  Port-A-Cath tip terminates at the superior cavoatrial junction. Mediastinum/Nodes: There is a heterogeneous, partially calcified lesion in the right axillary soft tissues which is difficult to separate from the adjacent pectoralis musculature though is concerning for a site of nodal metas ankle mint tatic disease in the setting of known lymphoma this measures approximately 4.5 x 3.5 cm in size. Several additional adjacent enlarged nodes are present in the right axilla measuring up to 15 mm short axis, likely corresponding to those seen on comparison ultrasound. No contralateral axillary, mediastinal or hilar adenopathy is evident. Wedge-shaped soft tissue attenuation draped across the anterior mediastinum can reflect thymic hyperplasia in the setting of lymphoma treatment. No acute abnormality of the trachea. Small sliding-type hiatal hernia. Thyroid gland is unremarkable as included. Lungs/Pleura: No consolidation, features of edema, pneumothorax, or effusion. No suspicious pulmonary nodules or masses. Upper Abdomen: No acute abnormalities present in the visualized portions of the upper abdomen. Musculoskeletal: No acute osseous abnormality or suspicious osseous lesion. Review of the MIP images confirms the above findings. IMPRESSION: 1. No evidence of acute pulmonary embolism. 2. Heterogeneous, partially calcified lesion in the right axillary soft tissues corresponds to the previously biopsied right axillary mass with pathology demonstrating anaplastic large-cell lymphoma. Several additional adjacent enlarged nodes are present in the right axilla measuring up to 15 mm short axis, likely corresponding to those seen on comparison ultrasound. 3. Wedge-shaped soft tissue attenuation draped across the anterior mediastinum can reflect thymic remnant and or hyperplasia in the setting of lymphoma treatment. 4. Small sliding-type hiatal hernia. Electronically Signed   By: PLovena LeM.D.   On: 02/27/2020 20:02    PERIPHERAL VASCULAR CATHETERIZATION  Result Date: 02/23/2020 See op note  NM PET Image Initial (PI) Skull Base To Thigh  Result Date: 03/02/2020 CLINICAL DATA:  Initial treatment strategy for large cell lymphoma. EXAM: NUCLEAR MEDICINE PET SKULL BASE TO THIGH TECHNIQUE: 7.3 mCi F-18 FDG was injected intravenously. Full-ring PET imaging was performed from the skull base to thigh after the radiotracer. CT data was obtained and used for attenuation correction and anatomic localization. Fasting blood glucose: 95 mg/dl COMPARISON:  Chest CT 02/27/2020 FINDINGS: Mediastinal blood pool activity: SUV max 1.7 Liver activity: SUV max 2.0 NECK: No significant abnormal hypermetabolic activity in this region. Incidental CT findings: none CHEST: Right axillary adenopathy noted, the largest lymph node is 3.5 cm in short axis on image 79 of series 3 with a maximum SUV of 42.8 ([place] 5). An adjacent smaller peripheral right axillary lymph node measuring 1.3 cm in short axis on image 83 of series 3 has maximum SUV of 23.3 ([place] 5). Anterior mediastinal soft tissue density resembling thymic tissue noted, maximum SUV 3.0 which is [place] 4. This probably represents residual uninvolved thymic tissue given the large difference between this appearance and the axillary adenopathy. Incidental CT findings: Right Port-A-Cath tip: Right atrium. ABDOMEN/PELVIS: No significant abnormal hypermetabolic activity in this region. No splenomegaly or focal splenic lesion. IUD noted along the endometrium. Incidental CT findings: No significant abnormal hypermetabolic activity in this region. SKELETON: No significant abnormal hypermetabolic activity in this region. Incidental CT findings: none IMPRESSION: 1. Right axillary pathologic adenopathy, maximum SUV 42.8 ([place] 5). 2. Anterior mediastinal density favors benign thymic tissue, maximum SUV 3.0 which is [place] 4, but far below the level of the axillary adenopathy.  Electronically Signed   By: Van Clines M.D.   On: 03/02/2020 11:46   CT BONE MARROW BIOPSY & ASPIRATION  Result Date: 02/17/2020 INDICATION: Recent diagnosis of lymphoma. Please perform CT-guided bone marrow biopsy for tissue diagnostic purposes. EXAM: CT-GUIDED BONE MARROW BIOPSY AND ASPIRATION MEDICATIONS: None ANESTHESIA/SEDATION: Fentanyl 2 mcg IV; Versed 100 mg IV Sedation Time: 10 Minutes; The patient was continuously monitored during the procedure by the interventional radiology nurse under my direct supervision. COMPLICATIONS: None immediate. PROCEDURE: Informed consent was obtained from the patient following an explanation of the procedure, risks, benefits and alternatives. The patient understands, agrees and consents for the procedure. All questions were addressed. A time out was performed prior to the initiation of the procedure. The patient was positioned prone and non-contrast localization CT was performed of the pelvis to demonstrate the iliac marrow spaces. The operative site was prepped and draped in the usual sterile fashion. Under sterile conditions and local anesthesia, a 22 gauge spinal needle was utilized for procedural planning. Next, an 11 gauge coaxial bone biopsy needle was advanced into the left iliac marrow space. Needle position was confirmed with CT imaging. Initially, a bone marrow aspiration was performed. Next, a bone marrow biopsy was obtained with the 11 gauge outer bone marrow device. The 11 gauge coaxial bone biopsy needle was re-advanced into a slightly different location within the left iliac marrow space, positioning was confirmed with CT imaging and an additional bone marrow biopsy was obtained. The needle was removed and superficial hemostasis was obtained with manual compression. A dressing was applied. The patient tolerated the procedure well without immediate post procedural complication. IMPRESSION: Successful CT guided left iliac bone marrow aspiration and  core biopsy. Electronically Signed   By: Sandi Mariscal M.D.   On: 02/17/2020 11:01   ECHOCARDIOGRAM LIMITED  Result Date: 02/18/2020    ECHOCARDIOGRAM LIMITED REPORT  Patient Name:   EMMALEE SOLIVAN Date of Exam: 02/18/2020 Medical Rec #:  [id number]          Height:       64.0 in Accession #:    [id number]         Weight:       153.0 lb Date of Birth:  09-20-1990          BSA:          1.746 m Patient Age:    29 years           BP:           104/62 mmHg Patient Gender: F                  HR:           75 bpm. Exam Location:  [facility] Procedure: Limited Echo, Limited Color Doppler and Cardiac Doppler Indications:     Lymphoma  History:         Patient has no prior history of Echocardiogram examinations.                  Lymphoma.  Sonographer:     Charmayne Sheer RDCS (AE) Referring Phys:  [id number] Nishaan Stanke Diagnosing Phys: Kate Sable MD IMPRESSIONS  1. Left ventricular ejection fraction, by estimation, is 60 to 65%. The left ventricle has normal function. The left ventricle has no regional wall motion abnormalities. Left ventricular diastolic function could not be evaluated.  2. Right ventricular systolic function is normal. The right ventricular size is normal.  3. The mitral valve is normal in structure. No evidence of mitral valve regurgitation. No evidence of mitral stenosis.  4. The aortic valve is normal in structure. Aortic valve regurgitation is not visualized. No aortic stenosis is present.  5. The inferior vena cava is normal in size with greater than 50% respiratory variability, suggesting right atrial pressure of 3 mmHg. FINDINGS  Left Ventricle: Left ventricular ejection fraction, by estimation, is 60 to 65%. The left ventricle has normal function. The left ventricle has no regional wall motion abnormalities. The left ventricular internal cavity size was normal in size. There is  no left ventricular hypertrophy. Left ventricular diastolic function could not be evaluated. Right Ventricle: The right  ventricular size is normal. No increase in right ventricular wall thickness. Right ventricular systolic function is normal. Left Atrium: Left atrial size was normal in size. Right Atrium: Right atrial size was normal in size. Pericardium: There is no evidence of pericardial effusion. Mitral Valve: The mitral valve is normal in structure. No evidence of mitral valve stenosis. Tricuspid Valve: The tricuspid valve is normal in structure. Tricuspid valve regurgitation is not demonstrated. No evidence of tricuspid stenosis. Aortic Valve: The aortic valve is normal in structure. Aortic valve regurgitation is not visualized. No aortic stenosis is present. Pulmonic Valve: The pulmonic valve was normal in structure. Pulmonic valve regurgitation is not visualized. No evidence of pulmonic stenosis. Aorta: The aortic root is normal in size and structure. Venous: The inferior vena cava is normal in size with greater than 50% respiratory variability, suggesting right atrial pressure of 3 mmHg. IAS/Shunts: No atrial level shunt detected by color flow Doppler. LEFT VENTRICLE PLAX 2D LVIDd:         4.74 cm LVIDs:         3.31 cm LV PW:         1.02 cm LV IVS:  0.80 cm LVOT diam:     2.20 cm LVOT Area:     3.80 cm  LEFT ATRIUM         Index LA diam:    3.50 cm 2.00 cm/m   AORTA Ao Root diam: 2.80 cm  SHUNTS Systemic Diam: 2.20 cm Kate Sable MD Electronically signed by Kate Sable MD Signature Date/Time: 02/18/2020/5:10:09 PM    Final       ASSESSMENT & PLAN:  1. 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 Chemotherapy-induced nausea   3. Chemotherapy induced diarrhea   4. 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 Labs are reviewed and discussed with patient.  Stable kidney function, potassium level, phosphorus level.  No signs  of TLSContinue allopurinol for prophylaxis.  Patient will receive 1 L of IV fluid normal saline today.  Repeat CBC, CMP, phosphorus uric acid in 1 week  #Lower back pain, I wonder if this is secondary to recent G-CSF injection. Okay to use Tylenol, if not helpful, use oxycodone as instructed.  #Chemotherapy induced diarrhea, recommend patient to use Imodium.  Imodium prescription was sent to pharmacy. Patient will receive 1 L of normal saline for hydration. #Chemotherapy-induced nausea.  Patient has tried Zofran and Compazine which did not help her symptoms.  Recommend Phenergan 25 mg every 6 hours as needed.  Advised patient to update me if antiemetic still does not work. Supportive care measures are necessary for patient well-being and will be provided as necessary. We spent sufficient time to discuss many aspect of care, questions were answered to patient's satisfaction.    Orders Placed This Encounter  Procedures  . CBC with Differential/Platelet    Standing Status:   Future    Standing Expiration Date:   03/11/2021  . Comprehensive metabolic panel    Standing Status:   Future    Standing Expiration Date:   03/11/2021  . Uric acid    Standing Status:   Future    Standing Expiration Date:   03/11/2021  . Phosphorus    Standing Status:   Future    Standing Expiration Date:   03/11/2021    All questions were answered. The patient knows to call the clinic with any problems questions or concerns.   Return visit as currently scheduled. Earlie Server, MD, PhD Hematology Oncology [facility] Pager- [phone] 03/11/2020

## 2020-03-11 NOTE — Progress Notes (Signed)
Pt stable at discharge.  

## 2020-03-11 NOTE — Progress Notes (Signed)
Pt here for follow up.She reports having no apettite, nausea,  diarrhea and lower back pain for about 1 week after last chemo. She started taking Immodium AD which helped with diarrhea. Antiemetics did not help with nausea. Pt also reports no pain to arm, but new pain to low back and wants to know if it will be ok to take Oxycodone for this.

## 2020-03-18 ENCOUNTER — Inpatient Hospital Stay: Payer: BC Managed Care – PPO

## 2020-03-18 DIAGNOSIS — C8464 Anaplastic large cell lymphoma, ALK-positive, lymph nodes of axilla and upper limb: Secondary | ICD-10-CM

## 2020-03-18 DIAGNOSIS — Z5111 Encounter for antineoplastic chemotherapy: Secondary | ICD-10-CM | POA: Diagnosis not present

## 2020-03-18 LAB — CBC WITH DIFFERENTIAL/PLATELET
Abs Immature Granulocytes: 0.12 10*3/uL — ABNORMAL HIGH (ref 0.00–0.07)
Basophils Absolute: 0.1 10*3/uL (ref 0.0–0.1)
Basophils Relative: 1 %
Eosinophils Absolute: 0 10*3/uL (ref 0.0–0.5)
Eosinophils Relative: 1 %
HCT: 37.7 % (ref 36.0–46.0)
Hemoglobin: 12.5 g/dL (ref 12.0–15.0)
Immature Granulocytes: 2 %
Lymphocytes Relative: 26 %
Lymphs Abs: 1.7 10*3/uL (ref 0.7–4.0)
MCH: 29.9 pg (ref 26.0–34.0)
MCHC: 33.2 g/dL (ref 30.0–36.0)
MCV: 90.2 fL (ref 80.0–100.0)
Monocytes Absolute: 0.6 10*3/uL (ref 0.1–1.0)
Monocytes Relative: 9 %
Neutro Abs: 4 10*3/uL (ref 1.7–7.7)
Neutrophils Relative %: 61 %
Platelets: 174 10*3/uL (ref 150–400)
RBC: 4.18 MIL/uL (ref 3.87–5.11)
RDW: 13.2 % (ref 11.5–15.5)
WBC: 6.5 10*3/uL (ref 4.0–10.5)
nRBC: 0 % (ref 0.0–0.2)

## 2020-03-18 LAB — URIC ACID: Uric Acid, Serum: 4 mg/dL (ref 2.5–7.1)

## 2020-03-18 LAB — COMPREHENSIVE METABOLIC PANEL
ALT: 20 U/L (ref 0–44)
AST: 19 U/L (ref 15–41)
Albumin: 4.3 g/dL (ref 3.5–5.0)
Alkaline Phosphatase: 54 U/L (ref 38–126)
Anion gap: 8 (ref 5–15)
BUN: 7 mg/dL (ref 6–20)
CO2: 27 mmol/L (ref 22–32)
Calcium: 9.4 mg/dL (ref 8.9–10.3)
Chloride: 101 mmol/L (ref 98–111)
Creatinine, Ser: 0.66 mg/dL (ref 0.44–1.00)
GFR, Estimated: >60 mL/min (ref >60)
Glucose, Bld: 89 mg/dL (ref 70–99)
Potassium: 4.2 mmol/L (ref 3.5–5.1)
Sodium: 136 mmol/L (ref 135–145)
Total Bilirubin: 0.4 mg/dL (ref 0.3–1.2)
Total Protein: 7.3 g/dL (ref 6.5–8.1)

## 2020-03-18 LAB — PHOSPHORUS: Phosphorus: 3.5 mg/dL (ref 2.5–4.6)

## 2020-03-18 LAB — MAGNESIUM: Magnesium: 2.3 mg/dL (ref 1.7–2.4)

## 2020-03-29 ENCOUNTER — Inpatient Hospital Stay: Payer: BC Managed Care – PPO

## 2020-03-29 ENCOUNTER — Encounter: Payer: Self-pay | Admitting: Oncology

## 2020-03-29 ENCOUNTER — Other Ambulatory Visit: Payer: Self-pay

## 2020-03-29 ENCOUNTER — Inpatient Hospital Stay (HOSPITAL_BASED_OUTPATIENT_CLINIC_OR_DEPARTMENT_OTHER): Payer: BC Managed Care – PPO | Admitting: Oncology

## 2020-03-29 VITALS — BP 116/78 | HR 75 | Temp 98.1°F | Wt 149.1 lb

## 2020-03-29 VITALS — BP 102/62 | HR 76 | Resp 18

## 2020-03-29 DIAGNOSIS — R110 Nausea: Secondary | ICD-10-CM | POA: Diagnosis not present

## 2020-03-29 DIAGNOSIS — K521 Toxic gastroenteritis and colitis: Secondary | ICD-10-CM | POA: Diagnosis not present

## 2020-03-29 DIAGNOSIS — C8464 Anaplastic large cell lymphoma, ALK-positive, lymph nodes of axilla and upper limb: Secondary | ICD-10-CM

## 2020-03-29 DIAGNOSIS — D6481 Anemia due to antineoplastic chemotherapy: Secondary | ICD-10-CM

## 2020-03-29 DIAGNOSIS — Z5111 Encounter for antineoplastic chemotherapy: Secondary | ICD-10-CM

## 2020-03-29 DIAGNOSIS — T451X5A Adverse effect of antineoplastic and immunosuppressive drugs, initial encounter: Secondary | ICD-10-CM

## 2020-03-29 LAB — CBC WITH DIFFERENTIAL/PLATELET
Abs Immature Granulocytes: 0.01 10*3/uL (ref 0.00–0.07)
Basophils Absolute: 0 10*3/uL (ref 0.0–0.1)
Basophils Relative: 1 %
Eosinophils Absolute: 0.1 10*3/uL (ref 0.0–0.5)
Eosinophils Relative: 2 %
HCT: 35 % — ABNORMAL LOW (ref 36.0–46.0)
Hemoglobin: 11.7 g/dL — ABNORMAL LOW (ref 12.0–15.0)
Immature Granulocytes: 0 %
Lymphocytes Relative: 36 %
Lymphs Abs: 1.5 10*3/uL (ref 0.7–4.0)
MCH: 30.1 pg (ref 26.0–34.0)
MCHC: 33.4 g/dL (ref 30.0–36.0)
MCV: 90 fL (ref 80.0–100.0)
Monocytes Absolute: 0.6 10*3/uL (ref 0.1–1.0)
Monocytes Relative: 16 %
Neutro Abs: 1.9 10*3/uL (ref 1.7–7.7)
Neutrophils Relative %: 45 %
Platelets: 302 10*3/uL (ref 150–400)
RBC: 3.89 MIL/uL (ref 3.87–5.11)
RDW: 14.3 % (ref 11.5–15.5)
WBC: 4.1 10*3/uL (ref 4.0–10.5)
nRBC: 0 % (ref 0.0–0.2)

## 2020-03-29 LAB — COMPREHENSIVE METABOLIC PANEL
ALT: 12 U/L (ref 0–44)
AST: 17 U/L (ref 15–41)
Albumin: 4.2 g/dL (ref 3.5–5.0)
Alkaline Phosphatase: 46 U/L (ref 38–126)
Anion gap: 9 (ref 5–15)
BUN: 10 mg/dL (ref 6–20)
CO2: 22 mmol/L (ref 22–32)
Calcium: 9.2 mg/dL (ref 8.9–10.3)
Chloride: 106 mmol/L (ref 98–111)
Creatinine, Ser: 0.54 mg/dL (ref 0.44–1.00)
GFR, Estimated: >60 mL/min (ref >60)
Glucose, Bld: 94 mg/dL (ref 70–99)
Potassium: 3.9 mmol/L (ref 3.5–5.1)
Sodium: 137 mmol/L (ref 135–145)
Total Bilirubin: 0.5 mg/dL (ref 0.3–1.2)
Total Protein: 7.3 g/dL (ref 6.5–8.1)

## 2020-03-29 LAB — URIC ACID: Uric Acid, Serum: 3.7 mg/dL (ref 2.5–7.1)

## 2020-03-29 LAB — PREGNANCY, URINE: Preg Test, Ur: NEGATIVE

## 2020-03-29 LAB — PHOSPHORUS: Phosphorus: 3.4 mg/dL (ref 2.5–4.6)

## 2020-03-29 LAB — MAGNESIUM: Magnesium: 1.9 mg/dL (ref 1.7–2.4)

## 2020-03-29 MED ORDER — SODIUM CHLORIDE 0.9 % IV SOLN
1.8000 mg/kg | Freq: Once | INTRAVENOUS | Status: AC
  Administered 2020-03-29: 125 mg via INTRAVENOUS
  Filled 2020-03-29: qty 25

## 2020-03-29 MED ORDER — SODIUM CHLORIDE 0.9 % IV SOLN
150.0000 mg | Freq: Once | INTRAVENOUS | Status: AC
  Administered 2020-03-29: 150 mg via INTRAVENOUS
  Filled 2020-03-29: qty 150

## 2020-03-29 MED ORDER — PALONOSETRON HCL INJECTION 0.25 MG/5ML
0.2500 mg | Freq: Once | INTRAVENOUS | Status: AC
  Administered 2020-03-29: 0.25 mg via INTRAVENOUS
  Filled 2020-03-29: qty 5

## 2020-03-29 MED ORDER — HEPARIN SOD (PORK) LOCK FLUSH 100 UNIT/ML IV SOLN
500.0000 [IU] | Freq: Once | INTRAVENOUS | Status: AC | PRN
  Administered 2020-03-29: 500 [IU]
  Filled 2020-03-29: qty 5

## 2020-03-29 MED ORDER — SODIUM CHLORIDE 0.9 % IV SOLN
Freq: Once | INTRAVENOUS | Status: AC
  Filled 2020-03-29: qty 250

## 2020-03-29 MED ORDER — SODIUM CHLORIDE 0.9 % IV SOLN
750.0000 mg/m2 | Freq: Once | INTRAVENOUS | Status: AC
  Administered 2020-03-29: 1320 mg via INTRAVENOUS
  Filled 2020-03-29: qty 50

## 2020-03-29 MED ORDER — DOXORUBICIN HCL CHEMO IV INJECTION 2 MG/ML
50.0000 mg/m2 | Freq: Once | INTRAVENOUS | Status: AC
  Administered 2020-03-29: 88 mg via INTRAVENOUS
  Filled 2020-03-29: qty 44

## 2020-03-29 MED ORDER — ACETAMINOPHEN 325 MG PO TABS
650.0000 mg | ORAL_TABLET | Freq: Once | ORAL | Status: AC
  Administered 2020-03-29: 650 mg via ORAL
  Filled 2020-03-29: qty 2

## 2020-03-29 MED ORDER — DIPHENHYDRAMINE HCL 50 MG/ML IJ SOLN
50.0000 mg | Freq: Once | INTRAMUSCULAR | Status: AC
  Administered 2020-03-29: 50 mg via INTRAVENOUS
  Filled 2020-03-29: qty 1

## 2020-03-29 MED ORDER — HEPARIN SOD (PORK) LOCK FLUSH 100 UNIT/ML IV SOLN
INTRAVENOUS | Status: AC
  Filled 2020-03-29: qty 5

## 2020-03-29 MED ORDER — SODIUM CHLORIDE 0.9 % IV SOLN
10.0000 mg | Freq: Once | INTRAVENOUS | Status: AC
  Administered 2020-03-29: 10 mg via INTRAVENOUS
  Filled 2020-03-29: qty 10

## 2020-03-29 NOTE — Progress Notes (Signed)
Hematology/Oncology follow up note [facility] Telephone:([phone] [phone]   Patient Care Team: Patient, No Pcp Per as PCP - General (General Practice)  REFERRING PROVIDER: No ref. provider found  CHIEF COMPLAINTS/REASON FOR VISIT:  Follow up for anaplastic large cell lymphoma, ALK postive  HISTORY OF PRESENTING ILLNESS:   Angel Tate is a  29 y.o.  female with PMH listed below was seen in consultation at the request of  No ref. provider found  for evaluation of lymphoma Patient reports feeling right axillary mass in June 2021.  Initially the mass did not bother her.  Patient developed a rash in the right axillary/upper outer quadrant of the breast and had additional work-up.  Patient was treated with a 10-day course of doxycycline for possible infection.  Did not improve. 02/04/2020, targeted ultrasound showed right axillary mass measuring 6.2 x 4 by 4 cm.  There are 2 adjacent smaller axillary mass measuring 2.0 x 1.2 x 1.4 cm and 0.8 x 0.7 x 0.9 cm.  Mammogram showed dense fibroglandular.  No suspicious mass or malignant type microcalcifications or distortion detected in either breast. Patient underwent ultrasound-guided core biopsy of the right axillary mass. 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  Ki-67 80-90% Flow cytometry has insufficient cells for analysis. Patient was referred to establish care for further evaluation and management. Retrospectively, patient recalls some night sweating.  No fever, chills, unintentional weight loss.  She has good energy level and appetite.  She has 2 children. #  Echocardiogram showed normal LVEF Bone marrow biopsy is negative  #Possible side effects of chemotherapy was discussed with patient.  In particular I discussed about infertility.  Patient does not desire to preserve fertility.  She has IUD for contraceptive measures.  #  03/02/2020 PET scan showed right axillary  pathological adenopathy, largest lymph node 3.5 cm with maximum SUV 42.8.  An adjacent smaller peripheral right axillary lymph node measuring 1.3 cm with SUV of 23.3. Anterior mediastinal density favors benign thymic tissue maximum SUV 3.0 which is due [place] 4, far below the level of axillary lymphadenopathy.   INTERVAL HISTORY Angel Tate is a 29 y.o. female who has above history reviewed by me today presents for follow-up of anaplastic large cell lymphoma ALK positive treatment. Diarrhea has improved after using Imodium AD.Marland Kitchen  Patient used Phenergan and nausea has improved.  No new complaints.  Right axillary mass has decreased in size, pain has completely resolved.    Review of Systems  Constitutional: Negative for appetite change, chills, fatigue and fever.  HENT:   Negative for hearing loss and voice change.   Eyes: Negative for eye problems.  Respiratory: Negative for chest tightness, cough and shortness of breath.   Cardiovascular: Negative for chest pain.  Gastrointestinal: Negative for abdominal distention, abdominal pain and blood in stool.  Endocrine: Negative for hot flashes.  Genitourinary: Negative for difficulty urinating and frequency.   Musculoskeletal: Negative for arthralgias.  Skin: Negative for itching and rash.  Neurological: Negative for extremity weakness.  Hematological: Negative for adenopathy.  Psychiatric/Behavioral: Negative for confusion.    MEDICAL HISTORY:  Past Medical History:  Diagnosis Date  . Anxiety   . Depression   . Medical history non-contributory     SURGICAL HISTORY: Past Surgical History:  Procedure Laterality Date  . DILATION AND CURETTAGE OF UTERUS    . DILATION AND EVACUATION N/A 02/17/2017   Procedure: DILATATION AND EVACUATION;  Surgeon: Janyth Pupa, DO;  Location: [facility];  Service: Gynecology;  Laterality: N/A;  . PORTA CATH INSERTION N/A 02/23/2020   Procedure: PORTA CATH INSERTION;  Surgeon: Algernon Huxley, MD;   Location: [facility];  Service: Cardiovascular;  Laterality: N/A;    SOCIAL HISTORY: Social History   Socioeconomic History  . Marital status: Married    Spouse name: [name]  . Number of children: 1  . Years of education: Not on file  . Highest education level: Not on file  Occupational History  . Occupation: social media- marketing   Tobacco Use  . Smoking status: Never Smoker  . Smokeless tobacco: Never Used  Vaping Use  . Vaping Use: Never used  Substance and Sexual Activity  . Alcohol use: Yes    Comment: socially  . Drug use: No  . Sexual activity: Yes    Birth control/protection: None  Other Topics Concern  . Not on file  Social History Narrative  . Not on file   Social Determinants of Health   Financial Resource Strain:   . Difficulty of Paying Living Expenses: Not on file  Food Insecurity:   . Worried About Charity fundraiser in the Last Year: Not on file  . Ran Out of Food in the Last Year: Not on file  Transportation Needs:   . Lack of Transportation (Medical): Not on file  . Lack of Transportation (Non-Medical): Not on file  Physical Activity:   . Days of Exercise per Week: Not on file  . Minutes of Exercise per Session: Not on file  Stress:   . Feeling of Stress : Not on file  Social Connections:   . Frequency of Communication with Friends and Family: Not on file  . Frequency of Social Gatherings with Friends and Family: Not on file  . Attends Religious Services: Not on file  . Active Member of Clubs or Organizations: Not on file  . Attends Archivist Meetings: Not on file  . Marital Status: Not on file  Intimate Partner Violence:   . Fear of Current or Ex-Partner: Not on file  . Emotionally Abused: Not on file  . Physically Abused: Not on file  . Sexually Abused: Not on file    FAMILY HISTORY: Family History  Problem Relation Age of Onset  . Hypertension Maternal Grandmother   . Asthma Maternal Aunt   . Ovarian cancer  Paternal Grandmother     ALLERGIES:  has No Known Allergies.  MEDICATIONS:  Current Outpatient Medications  Medication Sig Dispense Refill  . allopurinol (ZYLOPRIM) 300 MG tablet Take 1 tablet (300 mg total) by mouth daily. 30 tablet 3  . gabapentin (NEURONTIN) 100 MG capsule Take 1 capsule (100 mg total) by mouth 2 (two) times daily. 30 capsule 0  . ibuprofen (ADVIL,MOTRIN) 200 MG tablet Take 3 tablets (600 mg total) by mouth every 6 (six) hours as needed (for pain.). 30 tablet 0  . lidocaine-prilocaine (EMLA) cream Apply to affected area once 30 g 3  . loperamide (IMODIUM) 2 MG capsule Take 1 capsule (2 mg total) by mouth See admin instructions. Initial: 4 mg, followed by 2 mg every 2 to 4 hours or after each loose stool; Max 16 mg /24 hours 60 capsule 1  . ondansetron (ZOFRAN) 8 MG tablet Take 1 tablet (8 mg total) by mouth 2 (two) times daily as needed for refractory nausea / vomiting. Start on day 3 after cyclophosphamide chemotherapy. 30 tablet 1  . oxyCODONE (OXY IR/ROXICODONE) 5 MG immediate release tablet Take  1 tablet (5 mg total) by mouth every 6 (six) hours as needed for moderate pain or severe pain. 30 tablet 0  . predniSONE (DELTASONE) 20 MG tablet Take 5 tablets (100 mg total) by mouth daily. Take on days 2-5 of chemotherapy. 20 tablet 7  . prochlorperazine (COMPAZINE) 10 MG tablet Take 1 tablet (10 mg total) by mouth every 6 (six) hours as needed (Nausea or vomiting). 30 tablet 6  . promethazine (PHENERGAN) 25 MG tablet Take 1 tablet (25 mg total) by mouth every 6 (six) hours as needed for nausea or vomiting. 120 tablet 2   No current facility-administered medications for this visit.     PHYSICAL EXAMINATION: ECOG PERFORMANCE STATUS: 1 - Symptomatic but completely ambulatory Vitals:   03/29/20 0828  BP: 116/78  Pulse: 75  Temp: 98.1 F (36.7 C)  SpO2: 97%   Filed Weights   03/29/20 0828  Weight: 149 lb 1.6 oz (67.6 kg)    Physical Exam Constitutional:       General: She is not in acute distress. HENT:     Head: Normocephalic and atraumatic.  Eyes:     General: No scleral icterus. Cardiovascular:     Rate and Rhythm: Normal rate and regular rhythm.     Heart sounds: Normal heart sounds.  Pulmonary:     Effort: Pulmonary effort is normal. No respiratory distress.     Breath sounds: No wheezing.  Abdominal:     General: Bowel sounds are normal. There is no distension.     Palpations: Abdomen is soft.  Musculoskeletal:        General: No deformity. Normal range of motion.     Cervical back: Normal range of motion and neck supple.  Skin:    General: Skin is warm and dry.     Findings: No erythema or rash.  Neurological:     Mental Status: She is alert and oriented to person, place, and time. Mental status is at baseline.     Cranial Nerves: No cranial nerve deficit.     Coordination: Coordination normal.  Psychiatric:        Mood and Affect: Mood normal.   Right axillary palpable mass not palpable  LABORATORY DATA:  I have reviewed the data as listed Lab Results  Component Value Date   WBC 6.5 03/18/2020   HGB 12.5 03/18/2020   HCT 37.7 03/18/2020   MCV 90.2 03/18/2020   PLT 174 03/18/2020   Recent Labs    03/03/20 0824 03/03/20 0824 03/05/20 1352 03/11/20 1023 03/18/20 1126  NA 137   < > 136 137 136  K 4.2   < > 3.8 4.0 4.2  CL 106   < > 105 102 101  CO2 22   < > '23 26 27  ' GLUCOSE 98   < > 166* 124* 89  BUN 13   < > '14 10 7  ' CREATININE 0.63   < > 0.59 0.54 0.66  CALCIUM 9.2   < > 8.9 9.5 9.4  GFRNONAA >60   < > >60 >60 >60  PROT 7.4  --   --  7.6 7.3  ALBUMIN 4.1  --   --  4.5 4.3  AST 14*  --   --  21 19  ALT 11  --   --  21 20  ALKPHOS 49  --   --  65 54  BILITOT 0.5  --   --  0.5 0.4   < > = values in  this interval not displayed.   Iron/TIBC/Ferritin/ %Sat No results found for: IRON, TIBC, FERRITIN, IRONPCTSAT    RADIOGRAPHIC STUDIES: I have personally reviewed the radiological images as listed and  agreed with the findings in the report. NM PET Image Initial (PI) Skull Base To Thigh  Result Date: 03/02/2020 CLINICAL DATA:  Initial treatment strategy for large cell lymphoma. EXAM: NUCLEAR MEDICINE PET SKULL BASE TO THIGH TECHNIQUE: 7.3 mCi F-18 FDG was injected intravenously. Full-ring PET imaging was performed from the skull base to thigh after the radiotracer. CT data was obtained and used for attenuation correction and anatomic localization. Fasting blood glucose: 95 mg/dl COMPARISON:  Chest CT 02/27/2020 FINDINGS: Mediastinal blood pool activity: SUV max 1.7 Liver activity: SUV max 2.0 NECK: No significant abnormal hypermetabolic activity in this region. Incidental CT findings: none CHEST: Right axillary adenopathy noted, the largest lymph node is 3.5 cm in short axis on image 79 of series 3 with a maximum SUV of 42.8 (Deauville 5). An adjacent smaller peripheral right axillary lymph node measuring 1.3 cm in short axis on image 83 of series 3 has maximum SUV of 23.3 (Deauville 5). Anterior mediastinal soft tissue density resembling thymic tissue noted, maximum SUV 3.0 which is [place] 4. This probably represents residual uninvolved thymic tissue given the large difference between this appearance and the axillary adenopathy. Incidental CT findings: Right Port-A-Cath tip: Right atrium. ABDOMEN/PELVIS: No significant abnormal hypermetabolic activity in this region. No splenomegaly or focal splenic lesion. IUD noted along the endometrium. Incidental CT findings: No significant abnormal hypermetabolic activity in this region. SKELETON: No significant abnormal hypermetabolic activity in this region. Incidental CT findings: none IMPRESSION: 1. Right axillary pathologic adenopathy, maximum SUV 42.8 (Deauville 5). 2. Anterior mediastinal density favors benign thymic tissue, maximum SUV 3.0 which is [place] 4, but far below the level of the axillary adenopathy. Electronically Signed   By: Van Clines  M.D.   On: 03/02/2020 11:46      ASSESSMENT & PLAN:  1. Anaplastic ALK-positive large cell lymphoma of lymph node of axilla ([place])   2. Chemotherapy-induced nausea   3. Chemotherapy induced diarrhea   4. Encounter for antineoplastic chemotherapy   5. Anemia due to antineoplastic chemotherapy    #Right axillary anaplastic ALK positive large cell lymphoma, Stage I Status post cycle 1 BVCHP with G-CSF support Labs are reviewed and discussed with patient. No signs of tumor lysis syndrome.  Continue allopurinol for prophylaxis. Proceed with cycle 2 BV CHP with GCF support. Patient will take prednisone 100 mg from day to day day 5.  She receives dexamethasone on day 1   #Chemotherapy induced diarrhea, continue Imodium as instructed. Patient will receive 1 L of normal saline for hydration on day 3  #Chemotherapy-induced nausea.   Continue Phenergan as needed/instruction  #Anemia due to chemotherapy.  Very mild.  Monitor. #Family history of cancer.  Refer to Dietitian.  She agrees with the plan . Supportive care measures are necessary for patient well-being and will be provided as necessary. We spent sufficient time to discuss many aspect of care, questions were answered to patient's satisfaction.    Orders Placed This Encounter  Procedures  . Ambulatory referral to Genetics    Referral Priority:   Routine    Referral Type:   Consultation    Referral Reason:   Specialty Services Required    Number of Visits Requested:   1    All questions were answered. The patient knows to call the clinic with any  problems questions or concerns.   Return visit 3 weeks Earlie Server, MD, PhD Hematology Oncology [facility] Pager- [phone] 03/29/2020

## 2020-03-29 NOTE — Progress Notes (Signed)
1237- Patient tolerated treatment well. Patient and vital signs stable. Patient discharged to home at this time.

## 2020-03-31 ENCOUNTER — Inpatient Hospital Stay: Payer: BC Managed Care – PPO | Attending: Oncology

## 2020-03-31 ENCOUNTER — Other Ambulatory Visit: Payer: Self-pay

## 2020-03-31 VITALS — BP 101/55 | HR 73 | Temp 98.3°F | Resp 18

## 2020-03-31 DIAGNOSIS — D6481 Anemia due to antineoplastic chemotherapy: Secondary | ICD-10-CM | POA: Diagnosis not present

## 2020-03-31 DIAGNOSIS — Z8041 Family history of malignant neoplasm of ovary: Secondary | ICD-10-CM | POA: Diagnosis not present

## 2020-03-31 DIAGNOSIS — C8464 Anaplastic large cell lymphoma, ALK-positive, lymph nodes of axilla and upper limb: Secondary | ICD-10-CM | POA: Insufficient documentation

## 2020-03-31 DIAGNOSIS — Z5189 Encounter for other specified aftercare: Secondary | ICD-10-CM | POA: Diagnosis present

## 2020-03-31 DIAGNOSIS — Z79899 Other long term (current) drug therapy: Secondary | ICD-10-CM | POA: Diagnosis not present

## 2020-03-31 DIAGNOSIS — Z5111 Encounter for antineoplastic chemotherapy: Secondary | ICD-10-CM | POA: Diagnosis not present

## 2020-03-31 DIAGNOSIS — R197 Diarrhea, unspecified: Secondary | ICD-10-CM | POA: Insufficient documentation

## 2020-03-31 DIAGNOSIS — T451X5A Adverse effect of antineoplastic and immunosuppressive drugs, initial encounter: Secondary | ICD-10-CM | POA: Diagnosis not present

## 2020-03-31 DIAGNOSIS — R110 Nausea: Secondary | ICD-10-CM | POA: Diagnosis not present

## 2020-03-31 MED ORDER — HEPARIN SOD (PORK) LOCK FLUSH 100 UNIT/ML IV SOLN
INTRAVENOUS | Status: AC
  Filled 2020-03-31: qty 5

## 2020-03-31 MED ORDER — PEGFILGRASTIM-CBQV 6 MG/0.6ML ~~LOC~~ SOSY
6.0000 mg | PREFILLED_SYRINGE | Freq: Once | SUBCUTANEOUS | Status: AC
  Administered 2020-03-31: 6 mg via SUBCUTANEOUS
  Filled 2020-03-31: qty 0.6

## 2020-03-31 MED ORDER — SODIUM CHLORIDE 0.9 % IV SOLN
INTRAVENOUS | Status: DC
  Filled 2020-03-31 (×2): qty 250

## 2020-03-31 MED ORDER — HEPARIN SOD (PORK) LOCK FLUSH 100 UNIT/ML IV SOLN
500.0000 [IU] | Freq: Once | INTRAVENOUS | Status: AC
  Administered 2020-03-31: 500 [IU] via INTRAVENOUS
  Filled 2020-03-31: qty 5

## 2020-03-31 NOTE — Progress Notes (Signed)
Pt tolerated infusion well with no complications. VSS. Pt stable for discharge.   Angel Tate  

## 2020-04-16 ENCOUNTER — Inpatient Hospital Stay (HOSPITAL_BASED_OUTPATIENT_CLINIC_OR_DEPARTMENT_OTHER): Payer: BC Managed Care – PPO | Admitting: Oncology

## 2020-04-16 ENCOUNTER — Other Ambulatory Visit: Payer: Self-pay

## 2020-04-16 ENCOUNTER — Encounter: Payer: Self-pay | Admitting: Oncology

## 2020-04-16 ENCOUNTER — Inpatient Hospital Stay: Payer: BC Managed Care – PPO

## 2020-04-16 VITALS — BP 115/74 | HR 91 | Temp 98.2°F | Resp 16 | Wt 148.3 lb

## 2020-04-16 DIAGNOSIS — K521 Toxic gastroenteritis and colitis: Secondary | ICD-10-CM | POA: Diagnosis not present

## 2020-04-16 DIAGNOSIS — D6481 Anemia due to antineoplastic chemotherapy: Secondary | ICD-10-CM

## 2020-04-16 DIAGNOSIS — R110 Nausea: Secondary | ICD-10-CM

## 2020-04-16 DIAGNOSIS — Z5111 Encounter for antineoplastic chemotherapy: Secondary | ICD-10-CM | POA: Diagnosis not present

## 2020-04-16 DIAGNOSIS — C8464 Anaplastic large cell lymphoma, ALK-positive, lymph nodes of axilla and upper limb: Secondary | ICD-10-CM

## 2020-04-16 DIAGNOSIS — T451X5A Adverse effect of antineoplastic and immunosuppressive drugs, initial encounter: Secondary | ICD-10-CM

## 2020-04-16 LAB — CBC WITH DIFFERENTIAL/PLATELET
Abs Immature Granulocytes: 0.02 10*3/uL (ref 0.00–0.07)
Basophils Absolute: 0 10*3/uL (ref 0.0–0.1)
Basophils Relative: 1 %
Eosinophils Absolute: 0 10*3/uL (ref 0.0–0.5)
Eosinophils Relative: 1 %
HCT: 35.2 % — ABNORMAL LOW (ref 36.0–46.0)
Hemoglobin: 11.6 g/dL — ABNORMAL LOW (ref 12.0–15.0)
Immature Granulocytes: 1 %
Lymphocytes Relative: 26 %
Lymphs Abs: 1.1 10*3/uL (ref 0.7–4.0)
MCH: 30.1 pg (ref 26.0–34.0)
MCHC: 33 g/dL (ref 30.0–36.0)
MCV: 91.2 fL (ref 80.0–100.0)
Monocytes Absolute: 0.5 10*3/uL (ref 0.1–1.0)
Monocytes Relative: 11 %
Neutro Abs: 2.7 10*3/uL (ref 1.7–7.7)
Neutrophils Relative %: 60 %
Platelets: 260 10*3/uL (ref 150–400)
RBC: 3.86 MIL/uL — ABNORMAL LOW (ref 3.87–5.11)
RDW: 14.9 % (ref 11.5–15.5)
WBC: 4.4 10*3/uL (ref 4.0–10.5)
nRBC: 0 % (ref 0.0–0.2)

## 2020-04-16 LAB — MAGNESIUM: Magnesium: 2 mg/dL (ref 1.7–2.4)

## 2020-04-16 LAB — COMPREHENSIVE METABOLIC PANEL
ALT: 15 U/L (ref 0–44)
AST: 18 U/L (ref 15–41)
Albumin: 4.2 g/dL (ref 3.5–5.0)
Alkaline Phosphatase: 49 U/L (ref 38–126)
Anion gap: 8 (ref 5–15)
BUN: 9 mg/dL (ref 6–20)
CO2: 26 mmol/L (ref 22–32)
Calcium: 9.2 mg/dL (ref 8.9–10.3)
Chloride: 104 mmol/L (ref 98–111)
Creatinine, Ser: 0.55 mg/dL (ref 0.44–1.00)
GFR, Estimated: >60 mL/min (ref >60)
Glucose, Bld: 120 mg/dL — ABNORMAL HIGH (ref 70–99)
Potassium: 4.1 mmol/L (ref 3.5–5.1)
Sodium: 138 mmol/L (ref 135–145)
Total Bilirubin: 0.4 mg/dL (ref 0.3–1.2)
Total Protein: 7.2 g/dL (ref 6.5–8.1)

## 2020-04-16 LAB — PHOSPHORUS: Phosphorus: 2.8 mg/dL (ref 2.5–4.6)

## 2020-04-16 LAB — URIC ACID: Uric Acid, Serum: 4 mg/dL (ref 2.5–7.1)

## 2020-04-16 NOTE — Progress Notes (Signed)
Patient feels more irritable after treatments.  The  Benadryl she receives with treatment makes her fidgety during treatment.

## 2020-04-16 NOTE — Progress Notes (Signed)
Hematology/Oncology follow up note [facility] Telephone:(336) [phone] Fax:(336) [phone]   Patient Care Team: Patient, No Pcp Per as PCP - General (General Practice)  REFERRING PROVIDER: Earlie Server, MD  CHIEF COMPLAINTS/REASON FOR VISIT:  Follow up for anaplastic large cell lymphoma, ALK postive  HISTORY OF PRESENTING ILLNESS:   Angel Tate is a  29 y.o.  female with PMH listed below was seen in consultation at the request of  Earlie Server, MD  for evaluation of lymphoma Patient reports feeling right axillary mass in June 2021.  Initially the mass did not bother her.  Patient developed a rash in the right axillary/upper outer quadrant of the breast and had additional work-up.  Patient was treated with a 10-day course of doxycycline for possible infection.  Did not improve. 02/04/2020, targeted ultrasound showed right axillary mass measuring 6.2 x 4 by 4 cm.  There are 2 adjacent smaller axillary mass measuring 2.0 x 1.2 x 1.4 cm and 0.8 x 0.7 x 0.9 cm.  Mammogram showed dense fibroglandular.  No suspicious mass or malignant type microcalcifications or distortion detected in either breast. Patient underwent ultrasound-guided core biopsy of the right axillary mass. 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  Ki-67 80-90% Flow cytometry has insufficient cells for analysis. Patient was referred to establish care for further evaluation and management. Retrospectively, patient recalls some night sweating.  No fever, chills, unintentional weight loss.  She has good energy level and appetite.  She has 2 children. #  Echocardiogram showed normal LVEF Bone marrow biopsy is negative  #Possible side effects of chemotherapy was discussed with patient.  In particular I discussed about infertility.  Patient does not desire to preserve fertility.  She has IUD for contraceptive measures.  #  03/02/2020 PET scan showed right axillary pathological  adenopathy, largest lymph node 3.5 cm with maximum SUV 42.8.  An adjacent smaller peripheral right axillary lymph node measuring 1.3 cm with SUV of 23.3. Anterior mediastinal density favors benign thymic tissue maximum SUV 3.0 which is due [place] 4, far below the level of last has lymphadenopathy.   INTERVAL HISTORY Angel Tate is a 29 y.o. female who has above history reviewed by me today presents for follow-up of anaplastic large cell lymphoma ALK positive treatment. #Patient tolerates chemotherapy. Chemotherapy-induced diarrhea has been controlled with using Imodium  Chemotherapy-induced nausea has improved after using Finnergan.  No new complaints.  Right axillary mass is not palpable.  No pain.  Review of Systems  Constitutional: Negative for appetite change, chills, fatigue and fever.  HENT:   Negative for hearing loss and voice change.   Eyes: Negative for eye problems.  Respiratory: Negative for chest tightness, cough and shortness of breath.   Cardiovascular: Negative for chest pain.  Gastrointestinal: Negative for abdominal distention, abdominal pain and blood in stool.  Endocrine: Negative for hot flashes.  Genitourinary: Negative for difficulty urinating and frequency.   Musculoskeletal: Negative for arthralgias.  Skin: Negative for itching and rash.  Neurological: Negative for extremity weakness.  Hematological: Negative for adenopathy.  Psychiatric/Behavioral: Negative for confusion.    MEDICAL HISTORY:  Past Medical History:  Diagnosis Date  . Anxiety   . Depression   . Medical history non-contributory     SURGICAL HISTORY: Past Surgical History:  Procedure Laterality Date  . DILATION AND CURETTAGE OF UTERUS    . DILATION AND EVACUATION N/A 02/17/2017   Procedure: DILATATION AND EVACUATION;  Surgeon: Janyth Pupa, DO;  Location: [facility];  Service: Gynecology;  Laterality: N/A;  . PORTA CATH INSERTION N/A 02/23/2020   Procedure: PORTA CATH INSERTION;   Surgeon: Algernon Huxley, MD;  Location: [facility];  Service: Cardiovascular;  Laterality: N/A;    SOCIAL HISTORY: Social History   Socioeconomic History  . Marital status: Married    Spouse name: [name]  . Number of children: 1  . Years of education: Not on file  . Highest education level: Not on file  Occupational History  . Occupation: social media- marketing   Tobacco Use  . Smoking status: Never Smoker  . Smokeless tobacco: Never Used  Vaping Use  . Vaping Use: Never used  Substance and Sexual Activity  . Alcohol use: Yes    Comment: socially  . Drug use: No  . Sexual activity: Yes    Birth control/protection: None  Other Topics Concern  . Not on file  Social History Narrative  . Not on file   Social Determinants of Health   Financial Resource Strain: Not on file  Food Insecurity: Not on file  Transportation Needs: Not on file  Physical Activity: Not on file  Stress: Not on file  Social Connections: Not on file  Intimate Partner Violence: Not on file    FAMILY HISTORY: Family History  Problem Relation Age of Onset  . Hypertension Maternal Grandmother   . Asthma Maternal Aunt   . Ovarian cancer Paternal Grandmother     ALLERGIES:  has No Known Allergies.  MEDICATIONS:  Current Outpatient Medications  Medication Sig Dispense Refill  . allopurinol (ZYLOPRIM) 300 MG tablet Take 1 tablet (300 mg total) by mouth daily. 30 tablet 3  . gabapentin (NEURONTIN) 100 MG capsule Take 1 capsule (100 mg total) by mouth 2 (two) times daily. 30 capsule 0  . ibuprofen (ADVIL,MOTRIN) 200 MG tablet Take 3 tablets (600 mg total) by mouth every 6 (six) hours as needed (for pain.). 30 tablet 0  . lidocaine-prilocaine (EMLA) cream Apply to affected area once 30 g 3  . loperamide (IMODIUM) 2 MG capsule Take 1 capsule (2 mg total) by mouth See admin instructions. Initial: 4 mg, followed by 2 mg every 2 to 4 hours or after each loose stool; Max 16 mg /24 hours 60 capsule 1   . oxyCODONE (OXY IR/ROXICODONE) 5 MG immediate release tablet Take 1 tablet (5 mg total) by mouth every 6 (six) hours as needed for moderate pain or severe pain. 30 tablet 0  . predniSONE (DELTASONE) 20 MG tablet Take 5 tablets (100 mg total) by mouth daily. Take on days 2-5 of chemotherapy. 20 tablet 7  . promethazine (PHENERGAN) 25 MG tablet Take 1 tablet (25 mg total) by mouth every 6 (six) hours as needed for nausea or vomiting. 120 tablet 2  . ondansetron (ZOFRAN) 8 MG tablet Take 1 tablet (8 mg total) by mouth 2 (two) times daily as needed for refractory nausea / vomiting. Start on day 3 after cyclophosphamide chemotherapy. (Patient not taking: Reported on 04/16/2020) 30 tablet 1  . prochlorperazine (COMPAZINE) 10 MG tablet Take 1 tablet (10 mg total) by mouth every 6 (six) hours as needed (Nausea or vomiting). (Patient not taking: Reported on 04/16/2020) 30 tablet 6   No current facility-administered medications for this visit.     PHYSICAL EXAMINATION: ECOG PERFORMANCE STATUS: 1 - Symptomatic but completely ambulatory Vitals:   04/16/20 1343  BP: 115/74  Pulse: 91  Resp: 16  Temp: 98.2 F (36.8 C)   Filed Weights  04/16/20 1343  Weight: 148 lb 4.8 oz (67.3 kg)    Physical Exam Constitutional:      General: She is not in acute distress. HENT:     Head: Normocephalic and atraumatic.  Eyes:     General: No scleral icterus. Cardiovascular:     Rate and Rhythm: Normal rate and regular rhythm.     Heart sounds: Normal heart sounds.  Pulmonary:     Effort: Pulmonary effort is normal. No respiratory distress.     Breath sounds: No wheezing.  Abdominal:     General: Bowel sounds are normal. There is no distension.     Palpations: Abdomen is soft.  Musculoskeletal:        General: No deformity. Normal range of motion.     Cervical back: Normal range of motion and neck supple.  Skin:    General: Skin is warm and dry.     Findings: No erythema or rash.  Neurological:      Mental Status: She is alert and oriented to person, place, and time. Mental status is at baseline.     Cranial Nerves: No cranial nerve deficit.     Coordination: Coordination normal.  Psychiatric:        Mood and Affect: Mood normal.   Right axillary mass no longer palpable  LABORATORY DATA:  I have reviewed the data as listed Lab Results  Component Value Date   WBC 4.4 04/16/2020   HGB 11.6 (L) 04/16/2020   HCT 35.2 (L) 04/16/2020   MCV 91.2 04/16/2020   PLT 260 04/16/2020   Recent Labs    03/18/20 1126 03/29/20 0801 04/16/20 1307  NA 136 137 138  K 4.2 3.9 4.1  CL 101 106 104  CO2 _0 GLUCOSE 89 94 120*  BUN _1 CREATININE 0.66 0.54 0.55  CALCIUM 9.4 9.2 9.2  GFRNONAA >60 >60 >60  PROT 7.3 7.3 7.2  ALBUMIN 4.3 4.2 4.2  AST _2 ALT _3 ALKPHOS 54 46 49  BILITOT 0.4 0.5 0.4   Iron/TIBC/Ferritin/ %Sat No results found for: IRON, TIBC, FERRITIN, IRONPCTSAT    RADIOGRAPHIC STUDIES: I have personally reviewed the radiological images as listed and agreed with the findings in the report. No results found.    ASSESSMENT & PLAN:  1. Anaplastic ALK-positive large cell lymphoma of lymph node of axilla ([place])   2. Chemotherapy-induced nausea   3. Chemotherapy induced diarrhea   4. Encounter for antineoplastic chemotherapy   5. Anemia due to antineoplastic chemotherapy    #Right axillary anaplastic ALK positive large cell lymphoma, Stage I Status post cycle 2 BVCHP with G-CSF support Patient tolerates well. Labs are reviewed and discussed with patient. Patient will proceed with cycle 3 BV CHP on 04/19/2020 with G-CSF support on day3  Patient will take prednisone 100 mg from day 2- day 5.  She receives dexamethasone on day 1 Clinically she is responding to the chemotherapy very well.  Therefore I plan to obtain PET scan after 4 cycles of treatment. Decrease premedication Benadryl to 25 mg instead of 50 mg.  #Chemotherapy induced diarrhea,  continue Imodium as instructed. Symptoms are controlled.  #Chemotherapy-induced nausea.   Continue Phenergan as needed/instruction.  Symptoms are well controlled.  #Anemia due to chemotherapy.  Hemoglobin is stable at 11.6. #Family history of cancer.  Refer to Dietitian.  She agrees with the plan . Supportive care measures are necessary for patient well-being and will  be provided as necessary. We spent sufficient time to discuss many aspect of care, questions were answered to patient's satisfaction.     All questions were answered. The patient knows to call the clinic with any problems questions or concerns.   Return visit 3 weeks Earlie Server, MD, PhD Hematology Oncology [facility] at [facility] Pager- [phone] 04/16/2020

## 2020-04-19 ENCOUNTER — Inpatient Hospital Stay: Payer: BC Managed Care – PPO

## 2020-04-19 VITALS — BP 113/73 | HR 85 | Temp 97.6°F | Resp 16 | Wt 148.0 lb

## 2020-04-19 DIAGNOSIS — C8464 Anaplastic large cell lymphoma, ALK-positive, lymph nodes of axilla and upper limb: Secondary | ICD-10-CM

## 2020-04-19 DIAGNOSIS — Z5111 Encounter for antineoplastic chemotherapy: Secondary | ICD-10-CM | POA: Diagnosis not present

## 2020-04-19 MED ORDER — SODIUM CHLORIDE 0.9 % IV SOLN
150.0000 mg | Freq: Once | INTRAVENOUS | Status: AC
  Administered 2020-04-19: 150 mg via INTRAVENOUS
  Filled 2020-04-19: qty 150

## 2020-04-19 MED ORDER — SODIUM CHLORIDE 0.9 % IV SOLN
1.8000 mg/kg | Freq: Once | INTRAVENOUS | Status: AC
  Administered 2020-04-19: 125 mg via INTRAVENOUS
  Filled 2020-04-19: qty 25

## 2020-04-19 MED ORDER — SODIUM CHLORIDE 0.9 % IV SOLN
Freq: Once | INTRAVENOUS | Status: AC
Start: 2020-04-19 — End: 2020-04-19
  Filled 2020-04-19: qty 250

## 2020-04-19 MED ORDER — SODIUM CHLORIDE 0.9 % IV SOLN
750.0000 mg/m2 | Freq: Once | INTRAVENOUS | Status: AC
  Administered 2020-04-19: 1300 mg via INTRAVENOUS
  Filled 2020-04-19: qty 25

## 2020-04-19 MED ORDER — HEPARIN SOD (PORK) LOCK FLUSH 100 UNIT/ML IV SOLN
500.0000 [IU] | Freq: Once | INTRAVENOUS | Status: AC | PRN
  Administered 2020-04-19: 500 [IU]
  Filled 2020-04-19: qty 5

## 2020-04-19 MED ORDER — SODIUM CHLORIDE 0.9 % IV SOLN
10.0000 mg | Freq: Once | INTRAVENOUS | Status: AC
  Administered 2020-04-19: 10 mg via INTRAVENOUS
  Filled 2020-04-19: qty 10

## 2020-04-19 MED ORDER — HEPARIN SOD (PORK) LOCK FLUSH 100 UNIT/ML IV SOLN
INTRAVENOUS | Status: AC
  Filled 2020-04-19: qty 5

## 2020-04-19 MED ORDER — DOXORUBICIN HCL CHEMO IV INJECTION 2 MG/ML
50.0000 mg/m2 | Freq: Once | INTRAVENOUS | Status: AC
Start: 2020-04-19 — End: 2020-04-19
  Administered 2020-04-19: 88 mg via INTRAVENOUS
  Filled 2020-04-19: qty 25

## 2020-04-19 MED ORDER — ACETAMINOPHEN 325 MG PO TABS
650.0000 mg | ORAL_TABLET | Freq: Once | ORAL | Status: AC
  Administered 2020-04-19: 650 mg via ORAL
  Filled 2020-04-19: qty 2

## 2020-04-19 MED ORDER — PALONOSETRON HCL INJECTION 0.25 MG/5ML
0.2500 mg | Freq: Once | INTRAVENOUS | Status: AC
  Administered 2020-04-19: 0.25 mg via INTRAVENOUS
  Filled 2020-04-19: qty 5

## 2020-04-19 MED ORDER — DIPHENHYDRAMINE HCL 50 MG/ML IJ SOLN
25.0000 mg | Freq: Once | INTRAMUSCULAR | Status: AC
  Administered 2020-04-19: 25 mg via INTRAVENOUS
  Filled 2020-04-19: qty 1

## 2020-04-21 ENCOUNTER — Inpatient Hospital Stay: Payer: BC Managed Care – PPO

## 2020-04-21 ENCOUNTER — Other Ambulatory Visit: Payer: Self-pay

## 2020-04-21 VITALS — BP 105/59 | HR 79 | Temp 97.4°F | Resp 16

## 2020-04-21 DIAGNOSIS — C8464 Anaplastic large cell lymphoma, ALK-positive, lymph nodes of axilla and upper limb: Secondary | ICD-10-CM

## 2020-04-21 DIAGNOSIS — Z5111 Encounter for antineoplastic chemotherapy: Secondary | ICD-10-CM | POA: Diagnosis not present

## 2020-04-21 MED ORDER — HEPARIN SOD (PORK) LOCK FLUSH 100 UNIT/ML IV SOLN
500.0000 [IU] | Freq: Once | INTRAVENOUS | Status: AC
  Administered 2020-04-21: 500 [IU] via INTRAVENOUS
  Filled 2020-04-21: qty 5

## 2020-04-21 MED ORDER — PEGFILGRASTIM-CBQV 6 MG/0.6ML ~~LOC~~ SOSY
6.0000 mg | PREFILLED_SYRINGE | Freq: Once | SUBCUTANEOUS | Status: AC
  Administered 2020-04-21: 6 mg via SUBCUTANEOUS
  Filled 2020-04-21: qty 0.6

## 2020-04-21 MED ORDER — HEPARIN SOD (PORK) LOCK FLUSH 100 UNIT/ML IV SOLN
INTRAVENOUS | Status: AC
  Filled 2020-04-21: qty 5

## 2020-04-21 MED ORDER — SODIUM CHLORIDE 0.9 % IV SOLN
INTRAVENOUS | Status: DC
  Filled 2020-04-21 (×2): qty 250

## 2020-04-21 NOTE — Progress Notes (Signed)
Angel Tate's vital signs are stable today, and she has no complaints. Patient received 1 liter of IV fluids today with udenyca.

## 2020-05-10 ENCOUNTER — Inpatient Hospital Stay (HOSPITAL_BASED_OUTPATIENT_CLINIC_OR_DEPARTMENT_OTHER): Payer: BC Managed Care – PPO | Admitting: Oncology

## 2020-05-10 ENCOUNTER — Encounter: Payer: Self-pay | Admitting: Oncology

## 2020-05-10 ENCOUNTER — Inpatient Hospital Stay: Payer: BC Managed Care – PPO

## 2020-05-10 ENCOUNTER — Inpatient Hospital Stay: Payer: BC Managed Care – PPO | Attending: Oncology

## 2020-05-10 VITALS — BP 111/73 | HR 78 | Temp 99.4°F | Resp 16 | Wt 147.5 lb

## 2020-05-10 DIAGNOSIS — C8464 Anaplastic large cell lymphoma, ALK-positive, lymph nodes of axilla and upper limb: Secondary | ICD-10-CM | POA: Insufficient documentation

## 2020-05-10 DIAGNOSIS — Z5111 Encounter for antineoplastic chemotherapy: Secondary | ICD-10-CM

## 2020-05-10 DIAGNOSIS — Z5189 Encounter for other specified aftercare: Secondary | ICD-10-CM | POA: Insufficient documentation

## 2020-05-10 DIAGNOSIS — F419 Anxiety disorder, unspecified: Secondary | ICD-10-CM

## 2020-05-10 DIAGNOSIS — Z79899 Other long term (current) drug therapy: Secondary | ICD-10-CM | POA: Diagnosis not present

## 2020-05-10 DIAGNOSIS — Z5112 Encounter for antineoplastic immunotherapy: Secondary | ICD-10-CM | POA: Diagnosis present

## 2020-05-10 DIAGNOSIS — R110 Nausea: Secondary | ICD-10-CM

## 2020-05-10 DIAGNOSIS — D6481 Anemia due to antineoplastic chemotherapy: Secondary | ICD-10-CM

## 2020-05-10 DIAGNOSIS — T451X5A Adverse effect of antineoplastic and immunosuppressive drugs, initial encounter: Secondary | ICD-10-CM

## 2020-05-10 DIAGNOSIS — K521 Toxic gastroenteritis and colitis: Secondary | ICD-10-CM | POA: Diagnosis not present

## 2020-05-10 LAB — CBC WITH DIFFERENTIAL/PLATELET
Abs Immature Granulocytes: 0.01 10*3/uL (ref 0.00–0.07)
Basophils Absolute: 0.1 10*3/uL (ref 0.0–0.1)
Basophils Relative: 1 %
Eosinophils Absolute: 0.1 10*3/uL (ref 0.0–0.5)
Eosinophils Relative: 2 %
HCT: 34 % — ABNORMAL LOW (ref 36.0–46.0)
Hemoglobin: 11.5 g/dL — ABNORMAL LOW (ref 12.0–15.0)
Immature Granulocytes: 0 %
Lymphocytes Relative: 22 %
Lymphs Abs: 0.9 10*3/uL (ref 0.7–4.0)
MCH: 30.6 pg (ref 26.0–34.0)
MCHC: 33.8 g/dL (ref 30.0–36.0)
MCV: 90.4 fL (ref 80.0–100.0)
Monocytes Absolute: 0.7 10*3/uL (ref 0.1–1.0)
Monocytes Relative: 17 %
Neutro Abs: 2.3 10*3/uL (ref 1.7–7.7)
Neutrophils Relative %: 58 %
Platelets: 344 10*3/uL (ref 150–400)
RBC: 3.76 MIL/uL — ABNORMAL LOW (ref 3.87–5.11)
RDW: 15.9 % — ABNORMAL HIGH (ref 11.5–15.5)
WBC: 3.9 10*3/uL — ABNORMAL LOW (ref 4.0–10.5)
nRBC: 0 % (ref 0.0–0.2)

## 2020-05-10 LAB — COMPREHENSIVE METABOLIC PANEL
ALT: 15 U/L (ref 0–44)
AST: 19 U/L (ref 15–41)
Albumin: 4.1 g/dL (ref 3.5–5.0)
Alkaline Phosphatase: 50 U/L (ref 38–126)
Anion gap: 9 (ref 5–15)
BUN: 10 mg/dL (ref 6–20)
CO2: 23 mmol/L (ref 22–32)
Calcium: 9 mg/dL (ref 8.9–10.3)
Chloride: 105 mmol/L (ref 98–111)
Creatinine, Ser: 0.68 mg/dL (ref 0.44–1.00)
GFR, Estimated: >60 mL/min (ref >60)
Glucose, Bld: 102 mg/dL — ABNORMAL HIGH (ref 70–99)
Potassium: 4 mmol/L (ref 3.5–5.1)
Sodium: 137 mmol/L (ref 135–145)
Total Bilirubin: 0.6 mg/dL (ref 0.3–1.2)
Total Protein: 7.2 g/dL (ref 6.5–8.1)

## 2020-05-10 LAB — PHOSPHORUS: Phosphorus: 3.3 mg/dL (ref 2.5–4.6)

## 2020-05-10 LAB — URIC ACID: Uric Acid, Serum: 4.7 mg/dL (ref 2.5–7.1)

## 2020-05-10 LAB — PREGNANCY, URINE: Preg Test, Ur: NEGATIVE

## 2020-05-10 MED ORDER — SODIUM CHLORIDE 0.9 % IV SOLN
150.0000 mg | Freq: Once | INTRAVENOUS | Status: AC
  Administered 2020-05-10: 150 mg via INTRAVENOUS
  Filled 2020-05-10: qty 150

## 2020-05-10 MED ORDER — PALONOSETRON HCL INJECTION 0.25 MG/5ML
0.2500 mg | Freq: Once | INTRAVENOUS | Status: AC
  Administered 2020-05-10: 0.25 mg via INTRAVENOUS
  Filled 2020-05-10: qty 5

## 2020-05-10 MED ORDER — DOXORUBICIN HCL CHEMO IV INJECTION 2 MG/ML
50.0000 mg/m2 | Freq: Once | INTRAVENOUS | Status: AC
  Administered 2020-05-10: 88 mg via INTRAVENOUS
  Filled 2020-05-10: qty 25

## 2020-05-10 MED ORDER — SODIUM CHLORIDE 0.9 % IV SOLN
1.8000 mg/kg | Freq: Once | INTRAVENOUS | Status: AC
  Administered 2020-05-10: 125 mg via INTRAVENOUS
  Filled 2020-05-10: qty 25

## 2020-05-10 MED ORDER — SODIUM CHLORIDE 0.9 % IV SOLN
750.0000 mg/m2 | Freq: Once | INTRAVENOUS | Status: AC
  Administered 2020-05-10: 1300 mg via INTRAVENOUS
  Filled 2020-05-10: qty 50

## 2020-05-10 MED ORDER — DIPHENHYDRAMINE HCL 50 MG/ML IJ SOLN
25.0000 mg | Freq: Once | INTRAMUSCULAR | Status: AC
  Administered 2020-05-10: 25 mg via INTRAVENOUS
  Filled 2020-05-10: qty 1

## 2020-05-10 MED ORDER — HEPARIN SOD (PORK) LOCK FLUSH 100 UNIT/ML IV SOLN
INTRAVENOUS | Status: AC
  Filled 2020-05-10: qty 5

## 2020-05-10 MED ORDER — SODIUM CHLORIDE 0.9% FLUSH
10.0000 mL | Freq: Once | INTRAVENOUS | Status: AC
  Administered 2020-05-10: 10 mL via INTRAVENOUS
  Filled 2020-05-10: qty 10

## 2020-05-10 MED ORDER — LORAZEPAM 0.5 MG PO TABS: 0.5000 mg | ORAL_TABLET | Freq: Three times a day (TID) | ORAL | 0 refills | Status: DC | PRN

## 2020-05-10 MED ORDER — SODIUM CHLORIDE 0.9 % IV SOLN
Freq: Once | INTRAVENOUS | Status: AC
  Filled 2020-05-10: qty 250

## 2020-05-10 MED ORDER — ACETAMINOPHEN 325 MG PO TABS
650.0000 mg | ORAL_TABLET | Freq: Once | ORAL | Status: AC
  Administered 2020-05-10: 650 mg via ORAL
  Filled 2020-05-10: qty 2

## 2020-05-10 MED ORDER — HEPARIN SOD (PORK) LOCK FLUSH 100 UNIT/ML IV SOLN
500.0000 [IU] | Freq: Once | INTRAVENOUS | Status: AC
  Administered 2020-05-10: 500 [IU] via INTRAVENOUS
  Filled 2020-05-10: qty 5

## 2020-05-10 MED ORDER — SODIUM CHLORIDE 0.9 % IV SOLN
10.0000 mg | Freq: Once | INTRAVENOUS | Status: AC
  Administered 2020-05-10: 10 mg via INTRAVENOUS
  Filled 2020-05-10: qty 10

## 2020-05-10 NOTE — Progress Notes (Signed)
Patient has an increase in anxiety.  Had nausea after last treatment and seemed that the Phenergan did not work as well as previously.

## 2020-05-10 NOTE — Progress Notes (Signed)
Pt received AC + brentuximab infusion in clinic today. Tolerated well.

## 2020-05-10 NOTE — Progress Notes (Signed)
Hematology/Oncology follow up note [facility] Telephone:(336) [phone] Fax:(336) [phone]   Patient Care Team: Patient, No Pcp Per as PCP - General (General Practice) Earlie Server, MD as Consulting Physician (Oncology)  REFERRING PROVIDER: No ref. provider found  CHIEF COMPLAINTS/REASON FOR VISIT:  Follow up for anaplastic large cell lymphoma, ALK postive  HISTORY OF PRESENTING ILLNESS:   Angel Tate is a  30 y.o.  female with PMH listed below was seen in consultation at the request of  No ref. provider found  for evaluation of lymphoma Patient reports feeling right axillary mass in June 2021.  Initially the mass did not bother her.  Patient developed a rash in the right axillary/upper outer quadrant of the breast and had additional work-up.  Patient was treated with a 10-day course of doxycycline for possible infection.  Did not improve. 02/04/2020, targeted ultrasound showed right axillary mass measuring 6.2 x 4 by 4 cm.  There are 2 adjacent smaller axillary mass measuring 2.0 x 1.2 x 1.4 cm and 0.8 x 0.7 x 0.9 cm.  Mammogram showed dense fibroglandular.  No suspicious mass or malignant type microcalcifications or distortion detected in either breast. Patient underwent ultrasound-guided core biopsy of the right axillary mass. 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  Ki-67 80-90% Flow cytometry has insufficient cells for analysis. Patient was referred to establish care for further evaluation and management. Retrospectively, patient recalls some night sweating.  No fever, chills, unintentional weight loss.  She has good energy level and appetite.  She has 2 children. #  Echocardiogram showed normal LVEF Bone marrow biopsy is negative  #Possible side effects of chemotherapy was discussed with patient.  In particular I discussed about infertility.  Patient does not desire to preserve fertility.  She has IUD for contraceptive  measures.  #  03/02/2020 PET scan showed right axillary pathological adenopathy, largest lymph node 3.5 cm with maximum SUV 42.8.  An adjacent smaller peripheral right axillary lymph node measuring 1.3 cm with SUV of 23.3. Anterior mediastinal density favors benign thymic tissue maximum SUV 3.0 which is due [place] 4, far below the level of last has lymphadenopathy.   INTERVAL HISTORY Tamitha Norell is a 30 y.o. female who has above history reviewed by me today presents for follow-up of anaplastic large cell lymphoma ALK positive treatment. #Patient tolerates chemotherapy with mild difficulties. Patient reports nausea is not well controlled with Phenergan She is also more anxious.  Has a history of anxiety previously on Seroquel and currently off the medication.  Review of Systems  Constitutional: Negative for appetite change, chills, fatigue and fever.  HENT:   Negative for hearing loss and voice change.   Eyes: Negative for eye problems.  Respiratory: Negative for chest tightness, cough and shortness of breath.   Cardiovascular: Negative for chest pain.  Gastrointestinal: Negative for abdominal distention, abdominal pain and blood in stool.  Endocrine: Negative for hot flashes.  Genitourinary: Negative for difficulty urinating and frequency.   Musculoskeletal: Negative for arthralgias.  Skin: Negative for itching and rash.  Neurological: Negative for extremity weakness.  Hematological: Negative for adenopathy.  Psychiatric/Behavioral: Negative for confusion.    MEDICAL HISTORY:  Past Medical History:  Diagnosis Date  . Anxiety   . Depression   . Medical history non-contributory     SURGICAL HISTORY: Past Surgical History:  Procedure Laterality Date  . DILATION AND CURETTAGE OF UTERUS    . DILATION AND EVACUATION N/A 02/17/2017   Procedure: DILATATION AND EVACUATION;  Surgeon: Janyth Pupa, DO;  Location: [facility];  Service: Gynecology;  Laterality: N/A;  . PORTA CATH  INSERTION N/A 02/23/2020   Procedure: PORTA CATH INSERTION;  Surgeon: Algernon Huxley, MD;  Location: [facility];  Service: Cardiovascular;  Laterality: N/A;    SOCIAL HISTORY: Social History   Socioeconomic History  . Marital status: Married    Spouse name: [name]  . Number of children: 1  . Years of education: Not on file  . Highest education level: Not on file  Occupational History  . Occupation: social media- marketing   Tobacco Use  . Smoking status: Never Smoker  . Smokeless tobacco: Never Used  Vaping Use  . Vaping Use: Never used  Substance and Sexual Activity  . Alcohol use: Yes    Comment: socially  . Drug use: No  . Sexual activity: Yes    Birth control/protection: None  Other Topics Concern  . Not on file  Social History Narrative  . Not on file   Social Determinants of Health   Financial Resource Strain: Not on file  Food Insecurity: Not on file  Transportation Needs: Not on file  Physical Activity: Not on file  Stress: Not on file  Social Connections: Not on file  Intimate Partner Violence: Not on file    FAMILY HISTORY: Family History  Problem Relation Age of Onset  . Hypertension Maternal Grandmother   . Asthma Maternal Aunt   . Ovarian cancer Paternal Grandmother     ALLERGIES:  has No Known Allergies.  MEDICATIONS:  Current Outpatient Medications  Medication Sig Dispense Refill  . allopurinol (ZYLOPRIM) 300 MG tablet Take 1 tablet (300 mg total) by mouth daily. 30 tablet 3  . gabapentin (NEURONTIN) 100 MG capsule Take 1 capsule (100 mg total) by mouth 2 (two) times daily. 30 capsule 0  . ibuprofen (ADVIL,MOTRIN) 200 MG tablet Take 3 tablets (600 mg total) by mouth every 6 (six) hours as needed (for pain.). 30 tablet 0  . Levonorgestrel (SKYLA) 13.5 MG IUD _insert within 7 days of menses onset    . lidocaine-prilocaine (EMLA) cream Apply to affected area once 30 g 3  . loperamide (IMODIUM) 2 MG capsule Take 1 capsule (2 mg total) by  mouth See admin instructions. Initial: 4 mg, followed by 2 mg every 2 to 4 hours or after each loose stool; Max 16 mg /24 hours 60 capsule 1  . LORazepam (ATIVAN) 0.5 MG tablet Take 1 tablet (0.5 mg total) by mouth every 8 (eight) hours as needed for anxiety. 60 tablet 0  . oxyCODONE (OXY IR/ROXICODONE) 5 MG immediate release tablet Take 1 tablet (5 mg total) by mouth every 6 (six) hours as needed for moderate pain or severe pain. 30 tablet 0  . predniSONE (DELTASONE) 20 MG tablet Take 5 tablets (100 mg total) by mouth daily. Take on days 2-5 of chemotherapy. 20 tablet 7  . promethazine (PHENERGAN) 25 MG tablet Take 1 tablet (25 mg total) by mouth every 6 (six) hours as needed for nausea or vomiting. 120 tablet 2  . ondansetron (ZOFRAN) 8 MG tablet Take 1 tablet (8 mg total) by mouth 2 (two) times daily as needed for refractory nausea / vomiting. Start on day 3 after cyclophosphamide chemotherapy. (Patient not taking: No sig reported) 30 tablet 1  . prochlorperazine (COMPAZINE) 10 MG tablet Take 1 tablet (10 mg total) by mouth every 6 (six) hours as needed (Nausea or vomiting). (Patient not taking: No sig reported) 30  tablet 6   No current facility-administered medications for this visit.     PHYSICAL EXAMINATION: ECOG PERFORMANCE STATUS: 1 - Symptomatic but completely ambulatory Vitals:   05/10/20 1000  BP: 111/73  Pulse: 78  Resp: 16  Temp: 99.4 F (37.4 C)   Filed Weights   05/10/20 1000  Weight: 147 lb 8 oz (66.9 kg)    Physical Exam Constitutional:      General: She is not in acute distress. HENT:     Head: Normocephalic and atraumatic.  Eyes:     General: No scleral icterus. Cardiovascular:     Rate and Rhythm: Normal rate and regular rhythm.     Heart sounds: Normal heart sounds.  Pulmonary:     Effort: Pulmonary effort is normal. No respiratory distress.     Breath sounds: No wheezing.  Abdominal:     General: Bowel sounds are normal. There is no distension.      Palpations: Abdomen is soft.  Musculoskeletal:        General: No deformity. Normal range of motion.     Cervical back: Normal range of motion and neck supple.  Skin:    General: Skin is warm and dry.     Findings: No erythema or rash.  Neurological:     Mental Status: She is alert and oriented to person, place, and time. Mental status is at baseline.     Cranial Nerves: No cranial nerve deficit.     Coordination: Coordination normal.  Psychiatric:        Mood and Affect: Mood normal.   Right axillary mass no longer palpable  LABORATORY DATA:  I have reviewed the data as listed Lab Results  Component Value Date   WBC 3.9 (L) 05/10/2020   HGB 11.5 (L) 05/10/2020   HCT 34.0 (L) 05/10/2020   MCV 90.4 05/10/2020   PLT 344 05/10/2020   Recent Labs    03/29/20 0801 04/16/20 1307 05/10/20 0851  NA 137 138 137  K 3.9 4.1 4.0  CL 106 104 105  CO2 _0 GLUCOSE 94 120* 102*  BUN _1 CREATININE 0.54 0.55 0.68  CALCIUM 9.2 9.2 9.0  GFRNONAA >60 >60 >60  PROT 7.3 7.2 7.2  ALBUMIN 4.2 4.2 4.1  AST _2 ALT _3 ALKPHOS 46 49 50  BILITOT 0.5 0.4 0.6   Iron/TIBC/Ferritin/ %Sat No results found for: IRON, TIBC, FERRITIN, IRONPCTSAT    RADIOGRAPHIC STUDIES: I have personally reviewed the radiological images as listed and agreed with the findings in the report. No results found.    ASSESSMENT & PLAN:  1. 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 Chemotherapy-induced nausea   3. Chemotherapy induced diarrhea   4. Encounter for antineoplastic chemotherapy   5. Anemia due to antineoplastic chemotherapy   6. 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 Patient will take prednisone 100 mg from day 2- day 5.  She  receives dexamethasone on day 1 Clinically she is responding to the chemotherapy very well.  Repeat PET scan 2 weeks after cycle 4. Referred to [facility] oncology Dr. Mina Marble for second opinion.  Appreciated recommendation on 4 cycles versus 6 cycles of chemotherapy if patient achieves  complete remission after 4 cycles.  #Chemotherapy induced diarrhea, continue Imodium as instructed. Symptoms are controlled.  #Chemotherapy-induced nausea.   Continue Phenergan as needed/instruction.  I suspect that she probably has developed anxiety induced nausea, Recommend trial of Ativan 0.5 mg every 8 hours as needed for anxiety/nausea. Rationale and potential side effects were discussed with patient.  Recommend patient to avoid driving after taking Ativan.  Patient agrees with the plan.   #Anemia due to chemotherapy.  Hemoglobin is stable at 11.5, stable #Family history of cancer.  I have referred her to establish care with genetic counselor . Supportive care measures are necessary for patient well-being and will be provided as necessary. We spent sufficient time to discuss many aspect of care, questions were answered to patient's satisfaction.     All questions were answered. The patient knows to call the clinic with any problems questions or concerns.   Return visit 3 weeks for evaluation prior to next cycle treatments. Earlie Server, MD, PhD Hematology Oncology [facility] Pager- [phone] 05/10/2020

## 2020-05-12 ENCOUNTER — Inpatient Hospital Stay: Payer: BC Managed Care – PPO

## 2020-05-12 ENCOUNTER — Other Ambulatory Visit: Payer: Self-pay

## 2020-05-12 DIAGNOSIS — C8464 Anaplastic large cell lymphoma, ALK-positive, lymph nodes of axilla and upper limb: Secondary | ICD-10-CM

## 2020-05-12 DIAGNOSIS — Z5111 Encounter for antineoplastic chemotherapy: Secondary | ICD-10-CM | POA: Diagnosis not present

## 2020-05-12 MED ORDER — PEGFILGRASTIM-CBQV 6 MG/0.6ML ~~LOC~~ SOSY
6.0000 mg | PREFILLED_SYRINGE | Freq: Once | SUBCUTANEOUS | Status: AC
  Administered 2020-05-12: 6 mg via SUBCUTANEOUS
  Filled 2020-05-12: qty 0.6

## 2020-05-14 ENCOUNTER — Other Ambulatory Visit: Payer: Self-pay | Admitting: Oncology

## 2020-05-14 DIAGNOSIS — C8464 Anaplastic large cell lymphoma, ALK-positive, lymph nodes of axilla and upper limb: Secondary | ICD-10-CM

## 2020-05-18 ENCOUNTER — Encounter: Payer: Self-pay | Admitting: Oncology

## 2020-05-19 ENCOUNTER — Telehealth: Payer: Self-pay

## 2020-05-19 NOTE — Telephone Encounter (Signed)
Done ....  Pts 05/31/20 lab/MD/Infusion appts were moved to 06/02/20  And her 05/02/20 Inj appt was moved to 06/04/20 Pt wa made aware.

## 2020-05-19 NOTE — Telephone Encounter (Signed)
Patient sent a MyChart message regarding appt at [facility] on 1/31.  Dr. Tasia Catchings would like to get her appts here r/s after 1/31.   Please contact patient to r/s.

## 2020-05-20 ENCOUNTER — Other Ambulatory Visit: Payer: Self-pay

## 2020-05-20 ENCOUNTER — Encounter
Admission: RE | Admit: 2020-05-20 | Discharge: 2020-05-20 | Disposition: A | Discharge status: Home / Self Care | Payer: BC Managed Care – PPO | Source: Ambulatory Visit | Attending: Oncology | Admitting: Oncology

## 2020-05-20 DIAGNOSIS — C8464 Anaplastic large cell lymphoma, ALK-positive, lymph nodes of axilla and upper limb: Secondary | ICD-10-CM

## 2020-05-20 LAB — GLUCOSE, CAPILLARY: Glucose-Capillary: 76 mg/dL (ref 70–99)

## 2020-05-20 MED ORDER — FLUDEOXYGLUCOSE F - 18 (FDG) INJECTION
7.6000 | Freq: Once | INTRAVENOUS | Status: AC | PRN
  Administered 2020-05-20: 8.31 via INTRAVENOUS

## 2020-05-25 ENCOUNTER — Encounter: Payer: Self-pay | Admitting: Oncology

## 2020-05-31 ENCOUNTER — Other Ambulatory Visit: Payer: BC Managed Care – PPO

## 2020-05-31 ENCOUNTER — Ambulatory Visit: Payer: BC Managed Care – PPO

## 2020-05-31 ENCOUNTER — Ambulatory Visit: Payer: BC Managed Care – PPO | Admitting: Oncology

## 2020-06-02 ENCOUNTER — Inpatient Hospital Stay: Payer: BC Managed Care – PPO

## 2020-06-02 ENCOUNTER — Encounter: Payer: Self-pay | Admitting: Oncology

## 2020-06-02 ENCOUNTER — Ambulatory Visit: Payer: BC Managed Care – PPO

## 2020-06-02 ENCOUNTER — Inpatient Hospital Stay: Payer: BC Managed Care – PPO | Attending: Oncology

## 2020-06-02 ENCOUNTER — Inpatient Hospital Stay (HOSPITAL_BASED_OUTPATIENT_CLINIC_OR_DEPARTMENT_OTHER): Payer: BC Managed Care – PPO | Admitting: Oncology

## 2020-06-02 VITALS — BP 105/74 | HR 80 | Temp 100.0°F | Resp 16 | Wt 150.9 lb

## 2020-06-02 DIAGNOSIS — K521 Toxic gastroenteritis and colitis: Secondary | ICD-10-CM

## 2020-06-02 DIAGNOSIS — H1031 Unspecified acute conjunctivitis, right eye: Secondary | ICD-10-CM

## 2020-06-02 DIAGNOSIS — Z5111 Encounter for antineoplastic chemotherapy: Secondary | ICD-10-CM | POA: Insufficient documentation

## 2020-06-02 DIAGNOSIS — Z5112 Encounter for antineoplastic immunotherapy: Secondary | ICD-10-CM | POA: Diagnosis present

## 2020-06-02 DIAGNOSIS — C8464 Anaplastic large cell lymphoma, ALK-positive, lymph nodes of axilla and upper limb: Secondary | ICD-10-CM | POA: Insufficient documentation

## 2020-06-02 DIAGNOSIS — R110 Nausea: Secondary | ICD-10-CM | POA: Diagnosis not present

## 2020-06-02 DIAGNOSIS — Z5189 Encounter for other specified aftercare: Secondary | ICD-10-CM | POA: Insufficient documentation

## 2020-06-02 DIAGNOSIS — Z79899 Other long term (current) drug therapy: Secondary | ICD-10-CM | POA: Insufficient documentation

## 2020-06-02 DIAGNOSIS — D6481 Anemia due to antineoplastic chemotherapy: Secondary | ICD-10-CM

## 2020-06-02 DIAGNOSIS — T451X5A Adverse effect of antineoplastic and immunosuppressive drugs, initial encounter: Secondary | ICD-10-CM

## 2020-06-02 LAB — CBC WITH DIFFERENTIAL/PLATELET
Abs Immature Granulocytes: 0.01 10*3/uL (ref 0.00–0.07)
Basophils Absolute: 0 10*3/uL (ref 0.0–0.1)
Basophils Relative: 1 %
Eosinophils Absolute: 0 10*3/uL (ref 0.0–0.5)
Eosinophils Relative: 1 %
HCT: 33.2 % — ABNORMAL LOW (ref 36.0–46.0)
Hemoglobin: 11.3 g/dL — ABNORMAL LOW (ref 12.0–15.0)
Immature Granulocytes: 0 %
Lymphocytes Relative: 17 %
Lymphs Abs: 0.8 10*3/uL (ref 0.7–4.0)
MCH: 31.6 pg (ref 26.0–34.0)
MCHC: 34 g/dL (ref 30.0–36.0)
MCV: 92.7 fL (ref 80.0–100.0)
Monocytes Absolute: 0.7 10*3/uL (ref 0.1–1.0)
Monocytes Relative: 16 %
Neutro Abs: 2.9 10*3/uL (ref 1.7–7.7)
Neutrophils Relative %: 65 %
Platelets: 311 10*3/uL (ref 150–400)
RBC: 3.58 MIL/uL — ABNORMAL LOW (ref 3.87–5.11)
RDW: 15.3 % (ref 11.5–15.5)
WBC: 4.4 10*3/uL (ref 4.0–10.5)
nRBC: 0 % (ref 0.0–0.2)

## 2020-06-02 LAB — PREGNANCY, URINE: Preg Test, Ur: NEGATIVE

## 2020-06-02 LAB — COMPREHENSIVE METABOLIC PANEL
ALT: 15 U/L (ref 0–44)
AST: 27 U/L (ref 15–41)
Albumin: 4 g/dL (ref 3.5–5.0)
Alkaline Phosphatase: 52 U/L (ref 38–126)
Anion gap: 9 (ref 5–15)
BUN: 9 mg/dL (ref 6–20)
CO2: 24 mmol/L (ref 22–32)
Calcium: 9.2 mg/dL (ref 8.9–10.3)
Chloride: 106 mmol/L (ref 98–111)
Creatinine, Ser: 0.65 mg/dL (ref 0.44–1.00)
GFR, Estimated: >60 mL/min (ref >60)
Glucose, Bld: 115 mg/dL — ABNORMAL HIGH (ref 70–99)
Potassium: 4.2 mmol/L (ref 3.5–5.1)
Sodium: 139 mmol/L (ref 135–145)
Total Bilirubin: 0.5 mg/dL (ref 0.3–1.2)
Total Protein: 6.9 g/dL (ref 6.5–8.1)

## 2020-06-02 MED ORDER — SODIUM CHLORIDE 0.9% FLUSH
10.0000 mL | INTRAVENOUS | Status: DC | PRN
  Administered 2020-06-02: 10 mL
  Filled 2020-06-02: qty 10

## 2020-06-02 MED ORDER — SODIUM CHLORIDE 0.9 % IV SOLN
150.0000 mg | Freq: Once | INTRAVENOUS | Status: AC
  Administered 2020-06-02: 150 mg via INTRAVENOUS
  Filled 2020-06-02: qty 150

## 2020-06-02 MED ORDER — DOXORUBICIN HCL CHEMO IV INJECTION 2 MG/ML
50.0000 mg/m2 | Freq: Once | INTRAVENOUS | Status: AC
  Administered 2020-06-02: 88 mg via INTRAVENOUS
  Filled 2020-06-02: qty 44

## 2020-06-02 MED ORDER — SODIUM CHLORIDE 0.9 % IV SOLN
1.8000 mg/kg | Freq: Once | INTRAVENOUS | Status: AC
  Administered 2020-06-02: 125 mg via INTRAVENOUS
  Filled 2020-06-02: qty 25

## 2020-06-02 MED ORDER — ACETAMINOPHEN 325 MG PO TABS
650.0000 mg | ORAL_TABLET | Freq: Once | ORAL | Status: AC
  Administered 2020-06-02: 650 mg via ORAL
  Filled 2020-06-02: qty 2

## 2020-06-02 MED ORDER — SODIUM CHLORIDE 0.9 % IV SOLN
750.0000 mg/m2 | Freq: Once | INTRAVENOUS | Status: AC
  Administered 2020-06-02: 1300 mg via INTRAVENOUS
  Filled 2020-06-02: qty 50

## 2020-06-02 MED ORDER — HEPARIN SOD (PORK) LOCK FLUSH 100 UNIT/ML IV SOLN
500.0000 [IU] | Freq: Once | INTRAVENOUS | Status: DC
  Filled 2020-06-02: qty 5

## 2020-06-02 MED ORDER — HEPARIN SOD (PORK) LOCK FLUSH 100 UNIT/ML IV SOLN
500.0000 [IU] | Freq: Once | INTRAVENOUS | Status: AC | PRN
  Administered 2020-06-02: 500 [IU]
  Filled 2020-06-02: qty 5

## 2020-06-02 MED ORDER — DIPHENHYDRAMINE HCL 50 MG/ML IJ SOLN
25.0000 mg | Freq: Once | INTRAMUSCULAR | Status: AC
  Administered 2020-06-02: 25 mg via INTRAVENOUS
  Filled 2020-06-02: qty 1

## 2020-06-02 MED ORDER — CIPROFLOXACIN HCL 0.3 % OP SOLN
1.0000 [drp] | OPHTHALMIC | 0 refills | Status: DC
Start: 2020-06-02 — End: 2020-06-23

## 2020-06-02 MED ORDER — HEPARIN SOD (PORK) LOCK FLUSH 100 UNIT/ML IV SOLN
INTRAVENOUS | Status: AC
  Filled 2020-06-02: qty 5

## 2020-06-02 MED ORDER — DEXAMETHASONE SODIUM PHOSPHATE 100 MG/10ML IJ SOLN
10.0000 mg | Freq: Once | INTRAMUSCULAR | Status: AC
  Administered 2020-06-02: 10 mg via INTRAVENOUS
  Filled 2020-06-02: qty 10

## 2020-06-02 MED ORDER — SODIUM CHLORIDE 0.9% FLUSH
10.0000 mL | Freq: Once | INTRAVENOUS | Status: DC
  Filled 2020-06-02: qty 10

## 2020-06-02 MED ORDER — SODIUM CHLORIDE 0.9 % IV SOLN
Freq: Once | INTRAVENOUS | Status: AC
  Filled 2020-06-02: qty 250

## 2020-06-02 MED ORDER — PALONOSETRON HCL INJECTION 0.25 MG/5ML
0.2500 mg | Freq: Once | INTRAVENOUS | Status: AC
  Administered 2020-06-02: 0.25 mg via INTRAVENOUS
  Filled 2020-06-02: qty 5

## 2020-06-02 NOTE — Progress Notes (Signed)
Tolerated doxorubicin, cytoxan, brentuximab well. Discharged home in stable condition.

## 2020-06-02 NOTE — Progress Notes (Signed)
Right eye is red/irritated and is using Refresh eye drops.

## 2020-06-02 NOTE — Progress Notes (Signed)
Hematology/Oncology follow up note Angel Tate [phone] [phone]   Patient Care Team: Patient, No Pcp Per as PCP - General (General Practice) Earlie Server, MD as Consulting Physician (Oncology)  REFERRING PROVIDER: No ref. provider found  CHIEF COMPLAINTS/REASON FOR VISIT:  Follow up for anaplastic large cell lymphoma, ALK postive  HISTORY OF PRESENTING ILLNESS:   Angel Tate is a  30 y.o.  female with PMH listed below was seen in consultation at the request of  No ref. provider found  for evaluation of lymphoma Patient reports feeling right axillary mass in June 2021.  Initially the mass did not bother her.  Patient developed a rash in the right axillary/upper outer quadrant of the breast and had additional work-up.  Patient was treated with a 10-day course of doxycycline for possible infection.  Did not improve. 02/04/2020, targeted ultrasound showed right axillary mass measuring 6.2 x 4 by 4 cm.  There are 2 adjacent smaller axillary mass measuring 2.0 x 1.2 x 1.4 cm and 0.8 x 0.7 x 0.9 cm.  Mammogram showed dense fibroglandular.  No suspicious mass or malignant type microcalcifications or distortion detected in either breast. Patient underwent ultrasound-guided core biopsy of the right axillary mass. 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  Ki-67 80-90% Flow cytometry has insufficient cells for analysis. Patient was referred to establish care for further evaluation and management. Retrospectively, patient recalls some night sweating.  No fever, chills, unintentional weight loss.  She has good energy level and appetite.  She has 2 children. #  Echocardiogram showed normal LVEF Bone marrow biopsy is negative  #Possible side effects of chemotherapy was discussed with patient.  In particular I discussed about infertility.  Patient does not desire to preserve fertility.  She has IUD for contraceptive  measures.  #  03/02/2020 PET scan showed right axillary pathological adenopathy, largest lymph node 3.5 cm with maximum SUV 42.8.  An adjacent smaller peripheral right axillary lymph node measuring 1.3 cm with SUV of 23.3. Anterior mediastinal density favors benign thymic tissue maximum SUV 3.0 which is due [place] 4, far below the level of last has lymphadenopathy.   INTERVAL HISTORY Angel Tate is a 30 y.o. female who has above history reviewed by me today presents for follow-up of anaplastic large cell lymphoma ALK positive treatment. #Patient tolerates chemotherapy with mild difficulties. Interval PET scan showed complete response. She has had a second opinion at [facility] and was seen by Dr. Lanier Ensign who [per patient ]recommends to complete total 6 cycles of BVCHP.  Official recommendation note is not available via Care Everywhere. She feels well today.  No nausea vomiting diarrhea. Right eye is itchy, redness.  No vision changes.  Review of Systems  Constitutional: Negative for appetite change, chills, fatigue and fever.  HENT:   Negative for hearing loss and voice change.   Eyes: Positive for eye problems.  Respiratory: Negative for chest tightness, cough and shortness of breath.   Cardiovascular: Negative for chest pain.  Gastrointestinal: Negative for abdominal distention, abdominal pain and blood in stool.  Endocrine: Negative for hot flashes.  Genitourinary: Negative for difficulty urinating and frequency.   Musculoskeletal: Negative for arthralgias.  Skin: Negative for itching and rash.  Neurological: Negative for extremity weakness.  Hematological: Negative for adenopathy.  Psychiatric/Behavioral: Negative for confusion.    MEDICAL HISTORY:  Past Medical History:  Diagnosis Date  . Anxiety   . Depression   . Medical history non-contributory  SURGICAL HISTORY: Past Surgical History:  Procedure Laterality Date  . DILATION AND CURETTAGE OF UTERUS    . DILATION  AND EVACUATION N/A 02/17/2017   Procedure: DILATATION AND EVACUATION;  Surgeon: Janyth Pupa, DO;  Location: [facility];  Service: Gynecology;  Laterality: N/A;  . PORTA CATH INSERTION N/A 02/23/2020   Procedure: PORTA CATH INSERTION;  Surgeon: Algernon Huxley, MD;  Location: [facility];  Service: Cardiovascular;  Laterality: N/A;    SOCIAL HISTORY: Social History   Socioeconomic History  . Marital status: Married    Spouse name: [name]  . Number of children: 1  . Years of education: Not on file  . Highest education level: Not on file  Occupational History  . Occupation: social media- marketing   Tobacco Use  . Smoking status: Never Smoker  . Smokeless tobacco: Never Used  Vaping Use  . Vaping Use: Never used  Substance and Sexual Activity  . Alcohol use: Yes    Comment: socially  . Drug use: No  . Sexual activity: Yes    Birth control/protection: None  Other Topics Concern  . Not on file  Social History Narrative  . Not on file   Social Determinants of Health   Financial Resource Strain: Not on file  Food Insecurity: Not on file  Transportation Needs: Not on file  Physical Activity: Not on file  Stress: Not on file  Social Connections: Not on file  Intimate Partner Violence: Not on file    FAMILY HISTORY: Family History  Problem Relation Age of Onset  . Hypertension Maternal Grandmother   . Asthma Maternal Aunt   . Ovarian cancer Paternal Grandmother     ALLERGIES:  has No Known Allergies.  MEDICATIONS:  Current Outpatient Medications  Medication Sig Dispense Refill  . allopurinol (ZYLOPRIM) 300 MG tablet Take 1 tablet (300 mg total) by mouth daily. 30 tablet 3  . ciprofloxacin (CILOXAN) 0.3 % ophthalmic solution Place 1 drop into the right eye every 2 (two) hours while awake. Administer 1 drop, every 2 hours, while awake, for 2 days. Then 1 drop, every 4 hours, while awake, for the next 5 days. 5 mL 0  . ibuprofen (ADVIL,MOTRIN) 200 MG tablet Take 3  tablets (600 mg total) by mouth every 6 (six) hours as needed (for pain.). 30 tablet 0  . Levonorgestrel (SKYLA) 13.5 MG IUD _insert within 7 days of menses onset    . lidocaine-prilocaine (EMLA) cream Apply to affected area once 30 g 3  . loperamide (IMODIUM) 2 MG capsule Take 1 capsule (2 mg total) by mouth See admin instructions. Initial: 4 mg, followed by 2 mg every 2 to 4 hours or after each loose stool; Max 16 mg /24 hours 60 capsule 1  . LORazepam (ATIVAN) 0.5 MG tablet Take 1 tablet (0.5 mg total) by mouth every 8 (eight) hours as needed for anxiety. 60 tablet 0  . oxyCODONE (OXY IR/ROXICODONE) 5 MG immediate release tablet Take 1 tablet (5 mg total) by mouth every 6 (six) hours as needed for moderate pain or severe pain. 30 tablet 0  . predniSONE (DELTASONE) 20 MG tablet Take 5 tablets (100 mg total) by mouth daily. Take on days 2-5 of chemotherapy. 20 tablet 7  . promethazine (PHENERGAN) 25 MG tablet Take 1 tablet (25 mg total) by mouth every 6 (six) hours as needed for nausea or vomiting. 120 tablet 2  . gabapentin (NEURONTIN) 100 MG capsule Take 1 capsule (100 mg total) by mouth  2 (two) times daily. (Patient not taking: Reported on 06/02/2020) 30 capsule 0  . ondansetron (ZOFRAN) 8 MG tablet Take 1 tablet (8 mg total) by mouth 2 (two) times daily as needed for refractory nausea / vomiting. Start on day 3 after cyclophosphamide chemotherapy. (Patient not taking: No sig reported) 30 tablet 1  . prochlorperazine (COMPAZINE) 10 MG tablet Take 1 tablet (10 mg total) by mouth every 6 (six) hours as needed (Nausea or vomiting). (Patient not taking: No sig reported) 30 tablet 6   No current facility-administered medications for this visit.   Facility-Administered Medications Ordered in Other Visits  Medication Dose Route Frequency Provider Last Rate Last Admin  . heparin lock flush 100 unit/mL  500 Units Intravenous Once Earlie Server, MD      . sodium chloride flush (NS) 0.9 % injection 10 mL  10 mL  Intravenous Once Earlie Server, MD      . sodium chloride flush (NS) 0.9 % injection 10 mL  10 mL Intracatheter PRN Earlie Server, MD   10 mL at 06/02/20 1021     PHYSICAL EXAMINATION: ECOG PERFORMANCE STATUS: 1 - Symptomatic but completely ambulatory Vitals:   06/02/20 0922  BP: 105/74  Pulse: 80  Resp: 16  Temp: 100 F (37.8 C)   Filed Weights   06/02/20 0922  Weight: 150 lb 14.4 oz (68.4 kg)    Physical Exam Constitutional:      General: She is not in acute distress. HENT:     Head: Normocephalic and atraumatic.  Eyes:     General: No scleral icterus. Cardiovascular:     Rate and Rhythm: Normal rate and regular rhythm.     Heart sounds: Normal heart sounds.  Pulmonary:     Effort: Pulmonary effort is normal. No respiratory distress.     Breath sounds: No wheezing.  Abdominal:     General: Bowel sounds are normal. There is no distension.     Palpations: Abdomen is soft.  Musculoskeletal:        General: No deformity. Normal range of motion.     Cervical back: Normal range of motion and neck supple.  Skin:    General: Skin is warm and dry.     Findings: No erythema or rash.  Neurological:     Mental Status: She is alert and oriented to person, place, and time. Mental status is at baseline.     Cranial Nerves: No cranial nerve deficit.     Coordination: Coordination normal.  Psychiatric:        Mood and Affect: Mood normal.   Right axillary mass no longer palpable  LABORATORY DATA:  I have reviewed the data as listed Lab Results  Component Value Date   WBC 4.4 06/02/2020   HGB 11.3 (L) 06/02/2020   HCT 33.2 (L) 06/02/2020   MCV 92.7 06/02/2020   PLT 311 06/02/2020   Recent Labs    04/16/20 1307 05/10/20 0851 06/02/20 0855  NA 138 137 139  K 4.1 4.0 4.2  CL 104 105 106  CO2 '26 23 24  ' GLUCOSE 120* 102* 115*  BUN '9 10 9  ' CREATININE 0.55 0.68 0.65  CALCIUM 9.2 9.0 9.2  GFRNONAA >60 >60 >60  PROT 7.2 7.2 6.9  ALBUMIN 4.2 4.1 4.0  AST '18 19 27  ' ALT '15 15  15  ' ALKPHOS 49 50 52  BILITOT 0.4 0.6 0.5   Iron/TIBC/Ferritin/ %Sat No results found for: IRON, TIBC, FERRITIN, IRONPCTSAT    RADIOGRAPHIC STUDIES:  I have personally reviewed the radiological images as listed and agreed with the findings in the report. NM PET Image Restag (PS) Skull Base To Thigh  Result Date: 05/21/2020 CLINICAL DATA:  Subsequent treatment strategy for anaplastic ALK-positive large-cell lymphoma status post chemotherapy. EXAM: NUCLEAR MEDICINE PET SKULL BASE TO THIGH TECHNIQUE: 8.3 mCi F-18 FDG was injected intravenously. Full-ring PET imaging was performed from the skull base to thigh after the radiotracer. CT data was obtained and used for attenuation correction and anatomic localization. Fasting blood glucose: 76 mg/dl COMPARISON:  03/02/2020 PET-CT. FINDINGS: Mediastinal blood pool activity: SUV max 1.9 Liver activity: SUV max 2.8 NECK: No enlarged or hypermetabolic lymph nodes in the neck. Incidental CT findings: Mucoperiosteal thickening with small mucous retention cyst in the inferior left maxillary sinus. CHEST: No residual enlarged or hypermetabolic right axillary lymph nodes. No enlarged or hypermetabolic left axillary, mediastinal or hilar lymph nodes. Previously described anterior mediastinal thymic soft tissue with low level uptake has resolved. No hypermetabolic pulmonary findings. Incidental CT findings: Right internal jugular Port-A-Cath terminates at the cavoatrial junction. No acute consolidative airspace disease or significant pulmonary nodules. ABDOMEN/PELVIS: No abnormal hypermetabolic activity within the liver, pancreas, adrenal glands, or spleen. No hypermetabolic lymph nodes in the abdomen or pelvis. Incidental CT findings: IUD appears grossly well-positioned in the uterine cavity. SKELETON: No focal hypermetabolic activity to suggest skeletal metastasis. New generalized marrow hypermetabolism, most prominent in the vertebra, compatible with stimulated marrow  state. Incidental CT findings: none IMPRESSION: 1. Complete metabolic response. No residual enlarged or hypermetabolic right axillary lymph nodes. 2. Anterior mediastinal thymic soft tissue with low level uptake seen on prior PET-CT has resolved. 3. New generalized marrow hypermetabolism, compatible with stimulated marrow state from recent treatment. Electronically Signed   By: Ilona Sorrel M.D.   On: 05/21/2020 08:34      ASSESSMENT & PLAN:  1. Anaplastic ALK-positive large cell lymphoma of lymph node of axilla ([place])   2. Chemotherapy-induced nausea   3. Encounter for antineoplastic chemotherapy   4. Anemia due to antineoplastic chemotherapy   5. Acute conjunctivitis of right eye, unspecified acute conjunctivitis type    #Right axillary anaplastic ALK positive large cell lymphoma, Stage I Status post 4 cycles BVCHP with G-CSF support Interval PET scan showed complete response. Labs reviewed and discussed with patient. Proceed with cycle 5-day BVCHP with G-CSF support.  #Chemotherapy induced diarrhea, continue Imodium as instructed. Symptoms are controlled.  #Chemotherapy-induced nausea.   Continue Phenergan as needed/instruction.  Continue Ativan 0.5 mg every 8 hours as needed for anxiety/nausea. #Anemia due to chemotherapy.  Hemoglobin is stable at 11.3, stable #Family history of cancer.  I have referred her to establish care with genetic counselor #Right eye conjunctivitis, patient does not have primary care provider.  Trial of Cipro 0.3 ophthalmology solution.  Advised patient that if symptoms were not improving or getting worse, recommend her to see her ophthalmologist for further evaluation.  She agrees. . Supportive care measures are necessary for patient well-being and will be provided as necessary. We spent sufficient time to discuss many aspect of care, questions were answered to patient's satisfaction.   All questions were answered. The patient knows to call the clinic with  any problems questions or concerns.   Return visit 3 weeks for evaluation prior to next cycle treatments. Earlie Server, MD, PhD Hematology Oncology [facility] Pager- [phone] 06/02/2020

## 2020-06-04 ENCOUNTER — Inpatient Hospital Stay: Payer: BC Managed Care – PPO

## 2020-06-04 DIAGNOSIS — C8464 Anaplastic large cell lymphoma, ALK-positive, lymph nodes of axilla and upper limb: Secondary | ICD-10-CM

## 2020-06-04 DIAGNOSIS — Z5111 Encounter for antineoplastic chemotherapy: Secondary | ICD-10-CM | POA: Diagnosis not present

## 2020-06-04 MED ORDER — PEGFILGRASTIM-CBQV 6 MG/0.6ML ~~LOC~~ SOSY
6.0000 mg | PREFILLED_SYRINGE | Freq: Once | SUBCUTANEOUS | Status: AC
  Administered 2020-06-04: 6 mg via SUBCUTANEOUS
  Filled 2020-06-04: qty 0.6

## 2020-06-16 ENCOUNTER — Ambulatory Visit: Payer: BC Managed Care – PPO

## 2020-06-16 ENCOUNTER — Other Ambulatory Visit: Payer: BC Managed Care – PPO

## 2020-06-16 ENCOUNTER — Ambulatory Visit: Payer: BC Managed Care – PPO | Admitting: Oncology

## 2020-06-18 ENCOUNTER — Ambulatory Visit: Payer: BC Managed Care – PPO

## 2020-06-22 ENCOUNTER — Telehealth: Payer: Self-pay

## 2020-06-22 NOTE — Telephone Encounter (Signed)
Prior authorization request submitted for Lidocaine-Prilocaine cream via Cover My Meds Key: [id number]

## 2020-06-23 ENCOUNTER — Inpatient Hospital Stay (HOSPITAL_BASED_OUTPATIENT_CLINIC_OR_DEPARTMENT_OTHER): Payer: BC Managed Care – PPO | Admitting: Oncology

## 2020-06-23 ENCOUNTER — Inpatient Hospital Stay: Payer: BC Managed Care – PPO

## 2020-06-23 ENCOUNTER — Encounter: Payer: Self-pay | Admitting: Oncology

## 2020-06-23 ENCOUNTER — Other Ambulatory Visit: Payer: Self-pay

## 2020-06-23 VITALS — BP 105/60 | HR 93 | Temp 98.9°F | Resp 18 | Wt 150.2 lb

## 2020-06-23 DIAGNOSIS — D6481 Anemia due to antineoplastic chemotherapy: Secondary | ICD-10-CM | POA: Insufficient documentation

## 2020-06-23 DIAGNOSIS — Z5111 Encounter for antineoplastic chemotherapy: Secondary | ICD-10-CM

## 2020-06-23 DIAGNOSIS — C8464 Anaplastic large cell lymphoma, ALK-positive, lymph nodes of axilla and upper limb: Secondary | ICD-10-CM

## 2020-06-23 DIAGNOSIS — R110 Nausea: Secondary | ICD-10-CM

## 2020-06-23 DIAGNOSIS — D649 Anemia, unspecified: Secondary | ICD-10-CM | POA: Insufficient documentation

## 2020-06-23 DIAGNOSIS — T451X5A Adverse effect of antineoplastic and immunosuppressive drugs, initial encounter: Secondary | ICD-10-CM

## 2020-06-23 DIAGNOSIS — E8940 Asymptomatic postprocedural ovarian failure: Secondary | ICD-10-CM | POA: Insufficient documentation

## 2020-06-23 LAB — COMPREHENSIVE METABOLIC PANEL
ALT: 25 U/L (ref 0–44)
AST: 26 U/L (ref 15–41)
Albumin: 4.2 g/dL (ref 3.5–5.0)
Alkaline Phosphatase: 49 U/L (ref 38–126)
Anion gap: 10 (ref 5–15)
BUN: 13 mg/dL (ref 6–20)
CO2: 24 mmol/L (ref 22–32)
Calcium: 9.2 mg/dL (ref 8.9–10.3)
Chloride: 105 mmol/L (ref 98–111)
Creatinine, Ser: 0.58 mg/dL (ref 0.44–1.00)
GFR, Estimated: >60 mL/min (ref >60)
Glucose, Bld: 104 mg/dL — ABNORMAL HIGH (ref 70–99)
Potassium: 4.2 mmol/L (ref 3.5–5.1)
Sodium: 139 mmol/L (ref 135–145)
Total Bilirubin: 0.7 mg/dL (ref 0.3–1.2)
Total Protein: 7.2 g/dL (ref 6.5–8.1)

## 2020-06-23 LAB — CBC WITH DIFFERENTIAL/PLATELET
Abs Immature Granulocytes: 0.02 10*3/uL (ref 0.00–0.07)
Basophils Absolute: 0 10*3/uL (ref 0.0–0.1)
Basophils Relative: 1 %
Eosinophils Absolute: 0 10*3/uL (ref 0.0–0.5)
Eosinophils Relative: 0 %
HCT: 34.6 % — ABNORMAL LOW (ref 36.0–46.0)
Hemoglobin: 11.3 g/dL — ABNORMAL LOW (ref 12.0–15.0)
Immature Granulocytes: 0 %
Lymphocytes Relative: 12 %
Lymphs Abs: 0.7 10*3/uL (ref 0.7–4.0)
MCH: 31.2 pg (ref 26.0–34.0)
MCHC: 32.7 g/dL (ref 30.0–36.0)
MCV: 95.6 fL (ref 80.0–100.0)
Monocytes Absolute: 0.9 10*3/uL (ref 0.1–1.0)
Monocytes Relative: 16 %
Neutro Abs: 4 10*3/uL (ref 1.7–7.7)
Neutrophils Relative %: 71 %
Platelets: 290 10*3/uL (ref 150–400)
RBC: 3.62 MIL/uL — ABNORMAL LOW (ref 3.87–5.11)
RDW: 14.7 % (ref 11.5–15.5)
WBC: 5.6 10*3/uL (ref 4.0–10.5)
nRBC: 0 % (ref 0.0–0.2)

## 2020-06-23 LAB — PREGNANCY, URINE: Preg Test, Ur: NEGATIVE

## 2020-06-23 MED ORDER — HEPARIN SOD (PORK) LOCK FLUSH 100 UNIT/ML IV SOLN
500.0000 [IU] | Freq: Once | INTRAVENOUS | Status: DC | PRN
  Filled 2020-06-23: qty 5

## 2020-06-23 MED ORDER — PALONOSETRON HCL INJECTION 0.25 MG/5ML
0.2500 mg | Freq: Once | INTRAVENOUS | Status: AC
  Administered 2020-06-23: 0.25 mg via INTRAVENOUS
  Filled 2020-06-23: qty 5

## 2020-06-23 MED ORDER — SODIUM CHLORIDE 0.9 % IV SOLN
Freq: Once | INTRAVENOUS | Status: AC
  Filled 2020-06-23: qty 250

## 2020-06-23 MED ORDER — DIPHENHYDRAMINE HCL 50 MG/ML IJ SOLN
25.0000 mg | Freq: Once | INTRAMUSCULAR | Status: AC
Start: 2020-06-23 — End: 2020-06-23
  Administered 2020-06-23: 25 mg via INTRAVENOUS
  Filled 2020-06-23: qty 1

## 2020-06-23 MED ORDER — SODIUM CHLORIDE 0.9% FLUSH
10.0000 mL | Freq: Once | INTRAVENOUS | Status: DC
  Filled 2020-06-23: qty 10

## 2020-06-23 MED ORDER — SODIUM CHLORIDE 0.9 % IV SOLN
10.0000 mg | Freq: Once | INTRAVENOUS | Status: AC
  Administered 2020-06-23: 10 mg via INTRAVENOUS
  Filled 2020-06-23: qty 10

## 2020-06-23 MED ORDER — DOXORUBICIN HCL CHEMO IV INJECTION 2 MG/ML
50.0000 mg/m2 | Freq: Once | INTRAVENOUS | Status: AC
  Administered 2020-06-23: 88 mg via INTRAVENOUS
  Filled 2020-06-23: qty 25

## 2020-06-23 MED ORDER — VENLAFAXINE HCL 37.5 MG PO TABS: 37.5000 mg | ORAL_TABLET | Freq: Every day | ORAL | 1 refills | Status: DC

## 2020-06-23 MED ORDER — SODIUM CHLORIDE 0.9 % IV SOLN
750.0000 mg/m2 | Freq: Once | INTRAVENOUS | Status: AC
  Administered 2020-06-23: 1300 mg via INTRAVENOUS
  Filled 2020-06-23: qty 50

## 2020-06-23 MED ORDER — ACETAMINOPHEN 325 MG PO TABS
650.0000 mg | ORAL_TABLET | Freq: Once | ORAL | Status: AC
Start: 2020-06-23 — End: 2020-06-23
  Administered 2020-06-23: 650 mg via ORAL
  Filled 2020-06-23: qty 2

## 2020-06-23 MED ORDER — BRENTUXIMAB VEDOTIN 50 MG IV SOLR
1.8000 mg/kg | Freq: Once | INTRAVENOUS | Status: AC
Start: 2020-06-23 — End: 2020-06-23
  Administered 2020-06-23: 125 mg via INTRAVENOUS
  Filled 2020-06-23: qty 25

## 2020-06-23 MED ORDER — HEPARIN SOD (PORK) LOCK FLUSH 100 UNIT/ML IV SOLN
500.0000 [IU] | Freq: Once | INTRAVENOUS | Status: AC
  Administered 2020-06-23: 500 [IU] via INTRAVENOUS
  Filled 2020-06-23: qty 5

## 2020-06-23 MED ORDER — SODIUM CHLORIDE 0.9 % IV SOLN
150.0000 mg | Freq: Once | INTRAVENOUS | Status: AC
  Administered 2020-06-23: 150 mg via INTRAVENOUS
  Filled 2020-06-23: qty 150

## 2020-06-23 NOTE — Progress Notes (Signed)
Blood return noted before, every 3cc during and after Adriamycin push.

## 2020-06-23 NOTE — Progress Notes (Signed)
Patient here for follow up and tx. Pt reports that she has been having hot flashes, but they have been worse lately.

## 2020-06-23 NOTE — Progress Notes (Signed)
Hematology/Oncology follow up note [facility] [phone] [phone]   Patient Care Team: Patient, No Pcp Per as PCP - General (General Practice) Earlie Server, MD as Consulting Physician (Oncology)  REFERRING PROVIDER: No ref. provider found  CHIEF COMPLAINTS/REASON FOR VISIT:  Follow up for anaplastic large cell lymphoma, ALK postive  HISTORY OF PRESENTING ILLNESS:   Angel Tate is a  30 y.o.  female with PMH listed below was seen in consultation at the request of  No ref. provider found  for evaluation of lymphoma Patient reports feeling right axillary mass in June 2021.  Initially the mass did not bother her.  Patient developed a rash in the right axillary/upper outer quadrant of the breast and had additional work-up.  Patient was treated with a 10-day course of doxycycline for possible infection.  Did not improve. 02/04/2020, targeted ultrasound showed right axillary mass measuring 6.2 x 4 by 4 cm.  There are 2 adjacent smaller axillary mass measuring 2.0 x 1.2 x 1.4 cm and 0.8 x 0.7 x 0.9 cm.  Mammogram showed dense fibroglandular.  No suspicious mass or malignant type microcalcifications or distortion detected in either breast. Patient underwent ultrasound-guided core biopsy of the right axillary mass. 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  Ki-67 80-90% Flow cytometry has insufficient cells for analysis. Patient was referred to establish care for further evaluation and management. Retrospectively, patient recalls some night sweating.  No fever, chills, unintentional weight loss.  She has good energy level and appetite.  She has 2 children. #  Echocardiogram showed normal LVEF Bone marrow biopsy is negative  #Possible side effects of chemotherapy was discussed with patient.  In particular I discussed about infertility.  Patient does not desire to preserve fertility.  She has IUD for contraceptive  measures.  #  03/02/2020 PET scan showed right axillary pathological adenopathy, largest lymph node 3.5 cm with maximum SUV 42.8.  An adjacent smaller peripheral right axillary lymph node measuring 1.3 cm with SUV of 23.3. Anterior mediastinal density favors benign thymic tissue maximum SUV 3.0 which is due [place] 4, far below the level of last has lymphadenopathy.  #  second opinion at [facility] and was seen by Dr. Lanier Ensign who [per patient ]recommends to complete total 6 cycles of BVCHP.    INTERVAL HISTORY Angel Tate is a 30 y.o. female who has above history reviewed by me today presents for follow-up of anaplastic large cell lymphoma ALK positive treatment. Eye itchiness has improved. She has experienced hot flashes, worsen recently.  Last menstrual period was a few months ago.  Review of Systems  Constitutional: Negative for appetite change, chills, fatigue and fever.  HENT:   Negative for hearing loss and voice change.   Eyes: Negative for eye problems.  Respiratory: Negative for chest tightness, cough and shortness of breath.   Cardiovascular: Negative for chest pain.  Gastrointestinal: Negative for abdominal distention, abdominal pain and blood in stool.  Endocrine: Positive for hot flashes.  Genitourinary: Negative for difficulty urinating and frequency.   Musculoskeletal: Negative for arthralgias.  Skin: Negative for itching and rash.  Neurological: Negative for extremity weakness.  Hematological: Negative for adenopathy.  Psychiatric/Behavioral: Negative for confusion.    MEDICAL HISTORY:  Past Medical History:  Diagnosis Date  . Anxiety   . Depression   . Medical history non-contributory     SURGICAL HISTORY: Past Surgical History:  Procedure Laterality Date  . DILATION AND CURETTAGE OF UTERUS    .  DILATION AND EVACUATION N/A 02/17/2017   Procedure: DILATATION AND EVACUATION;  Surgeon: Janyth Pupa, DO;  Location: [facility];  Service: Gynecology;  Laterality:  N/A;  . PORTA CATH INSERTION N/A 02/23/2020   Procedure: PORTA CATH INSERTION;  Surgeon: Algernon Huxley, MD;  Location: [facility];  Service: Cardiovascular;  Laterality: N/A;    SOCIAL HISTORY: Social History   Socioeconomic History  . Marital status: Married    Spouse name: [name]  . Number of children: 1  . Years of education: Not on file  . Highest education level: Not on file  Occupational History  . Occupation: social media- marketing   Tobacco Use  . Smoking status: Never Smoker  . Smokeless tobacco: Never Used  Vaping Use  . Vaping Use: Never used  Substance and Sexual Activity  . Alcohol use: Yes    Comment: socially  . Drug use: No  . Sexual activity: Yes    Birth control/protection: None  Other Topics Concern  . Not on file  Social History Narrative  . Not on file   Social Determinants of Health   Financial Resource Strain: Not on file  Food Insecurity: Not on file  Transportation Needs: Not on file  Physical Activity: Not on file  Stress: Not on file  Social Connections: Not on file  Intimate Partner Violence: Not on file    FAMILY HISTORY: Family History  Problem Relation Age of Onset  . Hypertension Maternal Grandmother   . Asthma Maternal Aunt   . Ovarian cancer Paternal Grandmother     ALLERGIES:  has No Known Allergies.  MEDICATIONS:  Current Outpatient Medications  Medication Sig Dispense Refill  . allopurinol (ZYLOPRIM) 300 MG tablet Take 1 tablet (300 mg total) by mouth daily. 30 tablet 3  . ciprofloxacin (CILOXAN) 0.3 % ophthalmic solution Place 1 drop into the right eye every 2 (two) hours while awake. Administer 1 drop, every 2 hours, while awake, for 2 days. Then 1 drop, every 4 hours, while awake, for the next 5 days. 5 mL 0  . gabapentin (NEURONTIN) 100 MG capsule Take 1 capsule (100 mg total) by mouth 2 (two) times daily. (Patient not taking: Reported on 06/02/2020) 30 capsule 0  . ibuprofen (ADVIL,MOTRIN) 200 MG tablet Take 3  tablets (600 mg total) by mouth every 6 (six) hours as needed (for pain.). 30 tablet 0  . Levonorgestrel (SKYLA) 13.5 MG IUD _insert within 7 days of menses onset    . lidocaine-prilocaine (EMLA) cream Apply to affected area once 30 g 3  . loperamide (IMODIUM) 2 MG capsule Take 1 capsule (2 mg total) by mouth See admin instructions. Initial: 4 mg, followed by 2 mg every 2 to 4 hours or after each loose stool; Max 16 mg /24 hours 60 capsule 1  . LORazepam (ATIVAN) 0.5 MG tablet Take 1 tablet (0.5 mg total) by mouth every 8 (eight) hours as needed for anxiety. 60 tablet 0  . ondansetron (ZOFRAN) 8 MG tablet Take 1 tablet (8 mg total) by mouth 2 (two) times daily as needed for refractory nausea / vomiting. Start on day 3 after cyclophosphamide chemotherapy. (Patient not taking: No sig reported) 30 tablet 1  . oxyCODONE (OXY IR/ROXICODONE) 5 MG immediate release tablet Take 1 tablet (5 mg total) by mouth every 6 (six) hours as needed for moderate pain or severe pain. 30 tablet 0  . predniSONE (DELTASONE) 20 MG tablet Take 5 tablets (100 mg total) by mouth daily. Take on  days 2-5 of chemotherapy. 20 tablet 7  . prochlorperazine (COMPAZINE) 10 MG tablet Take 1 tablet (10 mg total) by mouth every 6 (six) hours as needed (Nausea or vomiting). (Patient not taking: No sig reported) 30 tablet 6  . promethazine (PHENERGAN) 25 MG tablet Take 1 tablet (25 mg total) by mouth every 6 (six) hours as needed for nausea or vomiting. 120 tablet 2   No current facility-administered medications for this visit.     PHYSICAL EXAMINATION: ECOG PERFORMANCE STATUS: 1 - Symptomatic but completely ambulatory Vitals:   06/23/20 0849 06/23/20 0915  BP: 96/68 105/60  Pulse: 93   Resp: 18   Temp: 98.9 F (37.2 C)   SpO2: 100%    Filed Weights   06/23/20 0849  Weight: 150 lb 3.2 oz (68.1 kg)    Physical Exam Constitutional:      General: She is not in acute distress. HENT:     Head: Normocephalic and atraumatic.   Eyes:     General: No scleral icterus. Cardiovascular:     Rate and Rhythm: Normal rate and regular rhythm.     Heart sounds: Normal heart sounds.  Pulmonary:     Effort: Pulmonary effort is normal. No respiratory distress.     Breath sounds: No wheezing.  Abdominal:     General: Bowel sounds are normal. There is no distension.     Palpations: Abdomen is soft.  Musculoskeletal:        General: No deformity. Normal range of motion.     Cervical back: Normal range of motion and neck supple.  Skin:    General: Skin is warm and dry.     Findings: No erythema or rash.  Neurological:     Mental Status: She is alert and oriented to person, place, and time. Mental status is at baseline.     Cranial Nerves: No cranial nerve deficit.     Coordination: Coordination normal.  Psychiatric:        Mood and Affect: Mood normal.   Right axillary mass no longer palpable  LABORATORY DATA:  I have reviewed the data as listed Lab Results  Component Value Date   WBC 4.4 06/02/2020   HGB 11.3 (L) 06/02/2020   HCT 33.2 (L) 06/02/2020   MCV 92.7 06/02/2020   PLT 311 06/02/2020   Recent Labs    04/16/20 1307 05/10/20 0851 06/02/20 0855  NA 138 137 139  K 4.1 4.0 4.2  CL 104 105 106  CO2 _0 GLUCOSE 120* 102* 115*  BUN _1 CREATININE 0.55 0.68 0.65  CALCIUM 9.2 9.0 9.2  GFRNONAA >60 >60 >60  PROT 7.2 7.2 6.9  ALBUMIN 4.2 4.1 4.0  AST _2 ALT _3 ALKPHOS 49 50 52  BILITOT 0.4 0.6 0.5   Iron/TIBC/Ferritin/ %Sat No results found for: IRON, TIBC, FERRITIN, IRONPCTSAT    RADIOGRAPHIC STUDIES: I have personally reviewed the radiological images as listed and agreed with the findings in the report. No results found.    ASSESSMENT & PLAN:  1. 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 Chemotherapy-induced nausea   3. Encounter for antineoplastic chemotherapy   4. Anemia due to antineoplastic chemotherapy   5. Post  chemotherapy ovarian failure    #Right axillary anaplastic ALK positive large cell lymphoma, Stage I Status post 4 cycles BVCHP with G-CSF support Interval PET scan showed complete response. Labs are reviewed  and discussed with patient.  Counts acceptable to proceed with cycle 6 BVCHP with G-CSF support. She will be finishing 6 cycles of treatment after today. There is no evidence that adding RT after six cycles of therapy is beneficial for patients who achieve a CR. Plan to repeat a PET scan in 10 weeks #Chemotherapy induced diarrhea, continue Imodium as instructed. Symptoms are controlled.  #Chemotherapy-induced nausea.   Continue current antiemetic regimen. Phenergan as needed/instruction.  Continue Ativan 0.5 mg every 8 hours as needed for anxiety/nausea. #hot flash , possible chemotherapy triggered ovarian dysfunction. Discussed with patient that this may be temporary or result in premature or early menopause. Recommend Effexor 37.5 mg daily for hot flash symptom relief.  Recommend patient establish care with gynecology.  If premature menopause, she need further discussion with gynecology with estrogen replacement therapy.  #Anemia due to chemotherapy.  Hemoglobin is stable at 11.3, stable #Family history of cancer.  I have referred her to establish care with genetic counselor #Right eye conjunctivitis, resolved.. Supportive care measures are necessary for patient well-being and will be provided as necessary. We spent sufficient time to discuss many aspect of care, questions were answered to patient's satisfaction.   All questions were answered. The patient knows to call the clinic with any problems questions or concerns.    Return visit after her PET scan in 10 weeks  Earlie Server, MD, PhD Hematology Oncology [facility] at [facility] Pager- [phone] 06/23/2020

## 2020-06-23 NOTE — Addendum Note (Signed)
Addended by: Earlie Server on: 06/23/2020 04:31 PM   Modules accepted: Orders

## 2020-06-24 NOTE — Telephone Encounter (Signed)
EMLA cream PA has been denied.  MyChart message sen to patient asking if she needed a refill at this time or if she thinks she may need it filled in the future.  If so, I will submit an appeal request.

## 2020-06-25 ENCOUNTER — Inpatient Hospital Stay: Payer: BC Managed Care – PPO

## 2020-06-25 DIAGNOSIS — C8464 Anaplastic large cell lymphoma, ALK-positive, lymph nodes of axilla and upper limb: Secondary | ICD-10-CM

## 2020-06-25 DIAGNOSIS — Z5111 Encounter for antineoplastic chemotherapy: Secondary | ICD-10-CM | POA: Diagnosis not present

## 2020-06-25 MED ORDER — PEGFILGRASTIM-CBQV 6 MG/0.6ML ~~LOC~~ SOSY
6.0000 mg | PREFILLED_SYRINGE | Freq: Once | SUBCUTANEOUS | Status: AC
  Administered 2020-06-25: 6 mg via SUBCUTANEOUS
  Filled 2020-06-25: qty 0.6

## 2020-07-17 ENCOUNTER — Other Ambulatory Visit: Payer: Self-pay | Admitting: Oncology

## 2020-08-17 ENCOUNTER — Inpatient Hospital Stay: Payer: BC Managed Care – PPO | Attending: Oncology

## 2020-08-17 ENCOUNTER — Other Ambulatory Visit: Payer: Self-pay

## 2020-08-17 DIAGNOSIS — Z95828 Presence of other vascular implants and grafts: Secondary | ICD-10-CM

## 2020-08-17 DIAGNOSIS — Z452 Encounter for adjustment and management of vascular access device: Secondary | ICD-10-CM | POA: Diagnosis present

## 2020-08-17 DIAGNOSIS — C8464 Anaplastic large cell lymphoma, ALK-positive, lymph nodes of axilla and upper limb: Secondary | ICD-10-CM | POA: Insufficient documentation

## 2020-08-17 MED ORDER — SODIUM CHLORIDE 0.9% FLUSH
10.0000 mL | Freq: Once | INTRAVENOUS | Status: AC
  Administered 2020-08-17: 10 mL via INTRAVENOUS
  Filled 2020-08-17: qty 10

## 2020-08-17 MED ORDER — HEPARIN SOD (PORK) LOCK FLUSH 100 UNIT/ML IV SOLN
500.0000 [IU] | Freq: Once | INTRAVENOUS | Status: AC
  Administered 2020-08-17: 500 [IU] via INTRAVENOUS
  Filled 2020-08-17: qty 5

## 2020-08-17 MED ORDER — HEPARIN SOD (PORK) LOCK FLUSH 100 UNIT/ML IV SOLN
INTRAVENOUS | Status: AC
  Filled 2020-08-17: qty 5

## 2020-08-18 ENCOUNTER — Inpatient Hospital Stay: Payer: BC Managed Care – PPO

## 2020-08-30 ENCOUNTER — Telehealth: Payer: Self-pay | Admitting: Oncology

## 2020-08-30 ENCOUNTER — Encounter
Admission: RE | Admit: 2020-08-30 | Discharge: 2020-08-30 | Disposition: A | Discharge status: Home / Self Care | Payer: BC Managed Care – PPO | Source: Ambulatory Visit | Attending: Oncology | Admitting: Oncology

## 2020-08-30 ENCOUNTER — Other Ambulatory Visit: Payer: Self-pay

## 2020-08-30 DIAGNOSIS — C8464 Anaplastic large cell lymphoma, ALK-positive, lymph nodes of axilla and upper limb: Secondary | ICD-10-CM | POA: Diagnosis present

## 2020-08-30 LAB — GLUCOSE, CAPILLARY: Glucose-Capillary: 88 mg/dL (ref 70–99)

## 2020-08-30 MED ORDER — FLUDEOXYGLUCOSE F - 18 (FDG) INJECTION
7.2000 | Freq: Once | INTRAVENOUS | Status: AC | PRN
  Administered 2020-08-30: 7.2 via INTRAVENOUS

## 2020-08-30 NOTE — Telephone Encounter (Signed)
Please cancel lab/MD on 5/4 as requested by pt. I will send her a Mychart message asking her to let us know the r/s PET date and we will r/s lab/MD to a few days after PET. Thanks

## 2020-08-30 NOTE — Telephone Encounter (Signed)
Pt was unable to get her PET scan this morning because the machine broke. They are going to call her to reschedule once it fixed. Pt wanted to cancel her 5/4 follow-up appt until she has it completed.

## 2020-08-31 NOTE — Telephone Encounter (Signed)
Pt informed of appts.

## 2020-08-31 NOTE — Telephone Encounter (Signed)
Pt sent mychart message stating that her PET has been r/s to tomorrow. Please r/s lab/MD follow up on 5/5 to Friday 5/6. Pt will see appts on Mychart.

## 2020-09-01 ENCOUNTER — Ambulatory Visit
Admission: RE | Admit: 2020-09-01 | Discharge: 2020-09-01 | Disposition: A | Discharge status: Home / Self Care | Payer: BC Managed Care – PPO | Source: Ambulatory Visit | Attending: Oncology | Admitting: Oncology

## 2020-09-01 ENCOUNTER — Other Ambulatory Visit: Payer: Self-pay

## 2020-09-01 ENCOUNTER — Inpatient Hospital Stay: Payer: BC Managed Care – PPO | Admitting: Oncology

## 2020-09-01 ENCOUNTER — Inpatient Hospital Stay: Payer: BC Managed Care – PPO

## 2020-09-01 DIAGNOSIS — C8464 Anaplastic large cell lymphoma, ALK-positive, lymph nodes of axilla and upper limb: Secondary | ICD-10-CM | POA: Insufficient documentation

## 2020-09-01 LAB — GLUCOSE, CAPILLARY: Glucose-Capillary: 95 mg/dL (ref 70–99)

## 2020-09-01 MED ORDER — FLUDEOXYGLUCOSE F - 18 (FDG) INJECTION
7.8000 | Freq: Once | INTRAVENOUS | Status: AC | PRN
  Administered 2020-09-01: 8.13 via INTRAVENOUS

## 2020-09-03 ENCOUNTER — Inpatient Hospital Stay: Payer: BC Managed Care – PPO | Attending: Oncology | Admitting: Oncology

## 2020-09-03 ENCOUNTER — Inpatient Hospital Stay: Payer: BC Managed Care – PPO

## 2020-09-03 ENCOUNTER — Encounter: Payer: Self-pay | Admitting: Oncology

## 2020-09-03 ENCOUNTER — Other Ambulatory Visit: Payer: Self-pay

## 2020-09-03 VITALS — BP 107/63 | Temp 97.8°F | Resp 18 | Wt 159.2 lb

## 2020-09-03 DIAGNOSIS — E8940 Asymptomatic postprocedural ovarian failure: Secondary | ICD-10-CM | POA: Diagnosis not present

## 2020-09-03 DIAGNOSIS — N951 Menopausal and female climacteric states: Secondary | ICD-10-CM | POA: Insufficient documentation

## 2020-09-03 DIAGNOSIS — Z95828 Presence of other vascular implants and grafts: Secondary | ICD-10-CM | POA: Diagnosis not present

## 2020-09-03 DIAGNOSIS — C8464 Anaplastic large cell lymphoma, ALK-positive, lymph nodes of axilla and upper limb: Secondary | ICD-10-CM | POA: Diagnosis present

## 2020-09-03 DIAGNOSIS — T451X5A Adverse effect of antineoplastic and immunosuppressive drugs, initial encounter: Secondary | ICD-10-CM | POA: Diagnosis not present

## 2020-09-03 DIAGNOSIS — Z8041 Family history of malignant neoplasm of ovary: Secondary | ICD-10-CM | POA: Insufficient documentation

## 2020-09-03 LAB — CBC WITH DIFFERENTIAL/PLATELET
Abs Immature Granulocytes: 0.01 10*3/uL (ref 0.00–0.07)
Basophils Absolute: 0 10*3/uL (ref 0.0–0.1)
Basophils Relative: 1 %
Eosinophils Absolute: 0.2 10*3/uL (ref 0.0–0.5)
Eosinophils Relative: 4 %
HCT: 38.9 % (ref 36.0–46.0)
Hemoglobin: 12.9 g/dL (ref 12.0–15.0)
Immature Granulocytes: 0 %
Lymphocytes Relative: 24 %
Lymphs Abs: 1.3 10*3/uL (ref 0.7–4.0)
MCH: 31.5 pg (ref 26.0–34.0)
MCHC: 33.2 g/dL (ref 30.0–36.0)
MCV: 95.1 fL (ref 80.0–100.0)
Monocytes Absolute: 0.6 10*3/uL (ref 0.1–1.0)
Monocytes Relative: 11 %
Neutro Abs: 3.3 10*3/uL (ref 1.7–7.7)
Neutrophils Relative %: 60 %
Platelets: 229 10*3/uL (ref 150–400)
RBC: 4.09 MIL/uL (ref 3.87–5.11)
RDW: 12.2 % (ref 11.5–15.5)
WBC: 5.5 10*3/uL (ref 4.0–10.5)
nRBC: 0 % (ref 0.0–0.2)

## 2020-09-03 LAB — COMPREHENSIVE METABOLIC PANEL
ALT: 14 U/L (ref 0–44)
AST: 19 U/L (ref 15–41)
Albumin: 4.1 g/dL (ref 3.5–5.0)
Alkaline Phosphatase: 53 U/L (ref 38–126)
Anion gap: 7 (ref 5–15)
BUN: 16 mg/dL (ref 6–20)
CO2: 27 mmol/L (ref 22–32)
Calcium: 9 mg/dL (ref 8.9–10.3)
Chloride: 105 mmol/L (ref 98–111)
Creatinine, Ser: 0.72 mg/dL (ref 0.44–1.00)
GFR, Estimated: >60 mL/min (ref >60)
Glucose, Bld: 113 mg/dL — ABNORMAL HIGH (ref 70–99)
Potassium: 4.4 mmol/L (ref 3.5–5.1)
Sodium: 139 mmol/L (ref 135–145)
Total Bilirubin: 0.4 mg/dL (ref 0.3–1.2)
Total Protein: 7.3 g/dL (ref 6.5–8.1)

## 2020-09-03 LAB — LACTATE DEHYDROGENASE: LDH: 130 U/L (ref 98–192)

## 2020-09-03 NOTE — Progress Notes (Signed)
Pt here for PET scan results. No new concerns voiced.

## 2020-09-04 DIAGNOSIS — Z95828 Presence of other vascular implants and grafts: Secondary | ICD-10-CM | POA: Insufficient documentation

## 2020-09-04 NOTE — Progress Notes (Signed)
Hematology/Oncology follow up note [facility] [phone] [phone]   Patient Care Team: Patient, No Pcp Per (Inactive) as PCP - General (General Practice) Earlie Server, MD as Consulting Physician (Oncology)  REFERRING PROVIDER: No ref. provider found  CHIEF COMPLAINTS/REASON FOR VISIT:  Follow up for anaplastic large cell lymphoma, ALK postive  HISTORY OF PRESENTING ILLNESS:   Angel Tate is a  30 y.o.  female with PMH listed below was seen in consultation at the request of  No ref. provider found  for evaluation of lymphoma Patient reports feeling right axillary mass in June 2021.  Initially the mass did not bother her.  Patient developed a rash in the right axillary/upper outer quadrant of the breast and had additional work-up.  Patient was treated with a 10-day course of doxycycline for possible infection.  Did not improve. 02/04/2020, targeted ultrasound showed right axillary mass measuring 6.2 x 4 by 4 cm.  There are 2 adjacent smaller axillary mass measuring 2.0 x 1.2 x 1.4 cm and 0.8 x 0.7 x 0.9 cm.  Mammogram showed dense fibroglandular.  No suspicious mass or malignant type microcalcifications or distortion detected in either breast. Patient underwent ultrasound-guided core biopsy of the right axillary mass. 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  Ki-67 80-90% Flow cytometry has insufficient cells for analysis. Patient was referred to establish care for further evaluation and management. Retrospectively, patient recalls some night sweating.  No fever, chills, unintentional weight loss.  She has good energy level and appetite.  She has 2 children.  02/17/2020 Bone marrow biopsy is negative 02/18/2020 Echocardiogram showed normal LVEF  # 03/02/2020 PET scan showed right axillary pathological adenopathy, largest lymph node 3.5 cm with maximum SUV 42.8.  An adjacent smaller peripheral right axillary lymph  node measuring 1.3 cm with SUV of 23.3. Anterior mediastinal density favors benign thymic tissue maximum SUV 3.0 which is due [place] 4, far below the level of last has lymphadenopathy.  #Possible side effects of chemotherapy was discussed with patient.  In particular I discussed about infertility.  Patient does not desire to preserve fertility.  She has IUD for contraceptive measures.  # 03/03/2020 - 05/30/2020 4 cycles of BV AVD Q21 days.  # 05/20/20 PET showed complete response. # second opinion at [facility] and was seen by Dr. Lanier Ensign who [per patient ]recommends to complete total 6 cycles of BVCHP.   # 05/31/2020 - 07/13/2020  INTERVAL HISTORY Angel Tate is a 30 y.o. female who has above history reviewed by me today presents for follow-up of anaplastic large cell lymphoma ALK positive treatment.  Her period has resumed. Hot flash has improved. No ne complaints. Had PET done.    Review of Systems  Constitutional: Negative for appetite change, chills, fatigue and fever.  HENT:   Negative for hearing loss and voice change.   Eyes: Negative for eye problems.  Respiratory: Negative for chest tightness, cough and shortness of breath.   Cardiovascular: Negative for chest pain.  Gastrointestinal: Negative for abdominal distention, abdominal pain and blood in stool.  Endocrine: Negative for hot flashes.  Genitourinary: Negative for difficulty urinating and frequency.   Musculoskeletal: Negative for arthralgias.  Skin: Negative for itching and rash.  Neurological: Negative for extremity weakness.  Hematological: Negative for adenopathy.  Psychiatric/Behavioral: Negative for confusion.    MEDICAL HISTORY:  Past Medical History:  Diagnosis Date  . Anxiety   . Depression   . Medical history non-contributory     SURGICAL  HISTORY: Past Surgical History:  Procedure Laterality Date  . DILATION AND CURETTAGE OF UTERUS    . DILATION AND EVACUATION N/A 02/17/2017   Procedure: DILATATION AND  EVACUATION;  Surgeon: Janyth Pupa, DO;  Location: [facility];  Service: Gynecology;  Laterality: N/A;  . PORTA CATH INSERTION N/A 02/23/2020   Procedure: PORTA CATH INSERTION;  Surgeon: Algernon Huxley, MD;  Location: [facility];  Service: Cardiovascular;  Laterality: N/A;    SOCIAL HISTORY: Social History   Socioeconomic History  . Marital status: Married    Spouse name: [name]  . Number of children: 1  . Years of education: Not on file  . Highest education level: Not on file  Occupational History  . Occupation: social media- marketing   Tobacco Use  . Smoking status: Never Smoker  . Smokeless tobacco: Never Used  Vaping Use  . Vaping Use: Never used  Substance and Sexual Activity  . Alcohol use: Yes    Comment: socially  . Drug use: No  . Sexual activity: Yes    Birth control/protection: None  Other Topics Concern  . Not on file  Social History Narrative  . Not on file   Social Determinants of Health   Financial Resource Strain: Not on file  Food Insecurity: Not on file  Transportation Needs: Not on file  Physical Activity: Not on file  Stress: Not on file  Social Connections: Not on file  Intimate Partner Violence: Not on file    FAMILY HISTORY: Family History  Problem Relation Age of Onset  . Hypertension Maternal Grandmother   . Asthma Maternal Aunt   . Ovarian cancer Paternal Grandmother     ALLERGIES:  has No Known Allergies.  MEDICATIONS:  Current Outpatient Medications  Medication Sig Dispense Refill  . ibuprofen (ADVIL,MOTRIN) 200 MG tablet Take 3 tablets (600 mg total) by mouth every 6 (six) hours as needed (for pain.). 30 tablet 0  . Levonorgestrel (SKYLA) 13.5 MG IUD _insert within 7 days of menses onset    . lidocaine-prilocaine (EMLA) cream Apply to affected area once 30 g 3  . loperamide (IMODIUM) 2 MG capsule Take 1 capsule (2 mg total) by mouth See admin instructions. Initial: 4 mg, followed by 2 mg every 2 to 4 hours or after each  loose stool; Max 16 mg /24 hours 60 capsule 1  . LORazepam (ATIVAN) 0.5 MG tablet Take 1 tablet (0.5 mg total) by mouth every 8 (eight) hours as needed for anxiety. 60 tablet 0  . oxyCODONE (OXY IR/ROXICODONE) 5 MG immediate release tablet Take 1 tablet (5 mg total) by mouth every 6 (six) hours as needed for moderate pain or severe pain. 30 tablet 0  . venlafaxine (EFFEXOR) 37.5 MG tablet TAKE 1 TABLET BY MOUTH EVERY DAY 90 tablet 1  . gabapentin (NEURONTIN) 100 MG capsule Take 1 capsule (100 mg total) by mouth 2 (two) times daily. (Patient not taking: No sig reported) 30 capsule 0  . ondansetron (ZOFRAN) 8 MG tablet Take 1 tablet (8 mg total) by mouth 2 (two) times daily as needed for refractory nausea / vomiting. Start on day 3 after cyclophosphamide chemotherapy. (Patient not taking: Reported on 09/03/2020) 30 tablet 1  . predniSONE (DELTASONE) 20 MG tablet Take 5 tablets (100 mg total) by mouth daily. Take on days 2-5 of chemotherapy. (Patient not taking: Reported on 09/03/2020) 20 tablet 7  . prochlorperazine (COMPAZINE) 10 MG tablet Take 1 tablet (10 mg total) by mouth every 6 (six)  hours as needed (Nausea or vomiting). (Patient not taking: Reported on 09/03/2020) 30 tablet 6  . promethazine (PHENERGAN) 25 MG tablet Take 1 tablet (25 mg total) by mouth every 6 (six) hours as needed for nausea or vomiting. (Patient not taking: Reported on 09/03/2020) 120 tablet 2   No current facility-administered medications for this visit.     PHYSICAL EXAMINATION: ECOG PERFORMANCE STATUS: 1 - Symptomatic but completely ambulatory Vitals:   09/03/20 1501  BP: 107/63  Resp: 18  Temp: 97.8 F (36.6 C)   Filed Weights   09/03/20 1501  Weight: 159 lb 3.2 oz (72.2 kg)    Physical Exam Constitutional:      General: She is not in acute distress. HENT:     Head: Normocephalic and atraumatic.  Eyes:     General: No scleral icterus. Cardiovascular:     Rate and Rhythm: Normal rate and regular rhythm.      Heart sounds: Normal heart sounds.  Pulmonary:     Effort: Pulmonary effort is normal. No respiratory distress.     Breath sounds: No wheezing.  Abdominal:     General: Bowel sounds are normal. There is no distension.     Palpations: Abdomen is soft.  Musculoskeletal:        General: No deformity. Normal range of motion.     Cervical back: Normal range of motion and neck supple.  Skin:    General: Skin is warm and dry.     Findings: No erythema or rash.  Neurological:     Mental Status: She is alert and oriented to person, place, and time. Mental status is at baseline.     Cranial Nerves: No cranial nerve deficit.     Coordination: Coordination normal.  Psychiatric:        Mood and Affect: Mood normal.     LABORATORY DATA:  I have reviewed the data as listed Lab Results  Component Value Date   WBC 5.5 09/03/2020   HGB 12.9 09/03/2020   HCT 38.9 09/03/2020   MCV 95.1 09/03/2020   PLT 229 09/03/2020   Recent Labs    06/02/20 0855 06/23/20 0825 09/03/20 1440  NA 139 139 139  K 4.2 4.2 4.4  CL 106 105 105  CO2 '24 24 27  ' GLUCOSE 115* 104* 113*  BUN '9 13 16  ' CREATININE 0.65 0.58 0.72  CALCIUM 9.2 9.2 9.0  GFRNONAA >60 >60 >60  PROT 6.9 7.2 7.3  ALBUMIN 4.0 4.2 4.1  AST '27 26 19  ' ALT '15 25 14  ' ALKPHOS 52 49 53  BILITOT 0.5 0.7 0.4   Iron/TIBC/Ferritin/ %Sat No results found for: IRON, TIBC, FERRITIN, IRONPCTSAT    RADIOGRAPHIC STUDIES: I have personally reviewed the radiological images as listed and agreed with the findings in the report. NM PET Image Restage (PS) Skull Base to Thigh  Result Date: 09/01/2020 CLINICAL DATA:  Subsequent treatment strategy for large cell lymphoma. EXAM: NUCLEAR MEDICINE PET SKULL BASE TO THIGH TECHNIQUE: 8.13 mCi F-18 FDG was injected intravenously. Full-ring PET imaging was performed from the skull base to thigh after the radiotracer. CT data was obtained and used for attenuation correction and anatomic localization. Fasting blood  glucose: 95 mg/dl COMPARISON:  05/20/2020 FINDINGS: Mediastinal blood pool activity: SUV max 1.79 Liver activity: SUV max 2.43 NECK: FDG avid brown fat is identified within the subcutaneous soft tissues of the neck and chest. No enlarged or FDG avid lymph nodes. Incidental CT findings: none CHEST: No hypermetabolic mediastinal  or hilar nodes. No suspicious pulmonary nodules on the CT scan. Incidental CT findings: Right chest wall port a catheter is noted with tip in the cavoatrial junction. ABDOMEN/PELVIS: No abnormal hypermetabolic activity within the liver, pancreas, adrenal glands, or spleen. No hypermetabolic lymph nodes in the abdomen or pelvis. Incidental CT findings: IUD identified within the uterus. SKELETON: No focal hypermetabolic activity to suggest skeletal metastasis. Interval decrease in previously noted bone marrow hypermetabolism. Incidental CT findings: none IMPRESSION: 1. No signs to suggest residual or recurrent FDG avid tumor. 2. Resolution of previously noted generalized marrow hypermetabolism. 3. FDG avid brown fat noted within the neck and chest compatible with benign physiologic activity. Electronically Signed   By: Kerby Moors M.D.   On: 09/01/2020 10:04      ASSESSMENT & PLAN:  1. 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 Port-A-Cath in place   3. 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 09/01/2020 post treatment  PET scan showed No signs to suggest residual or recurrent FDG avid tumor.Marland Kitchen Resolution of previously noted generalized marrow hypermetabolism. FDG avid brown fat noted within the neck and chest compatible with benign physiologic activity She has achieved remission.  Recommend H&P, PET scan every 6 months for 2 years, and PET annually until year 5.   #Family history of  cancer.  I have referred her to establish care with genetic counselor #Porta Cath in place. Will ask vascular surgeon to remove femoral port.  # Post chemotherapy ovarian failure, resolved. Hot flash symptoms improved.   Supportive care measures are necessary for patient well-being and will be provided as necessary. We spent sufficient time to discuss many aspect of care, questions were answered to patient's satisfaction.   All questions were answered. The patient knows to call the clinic with any problems questions or concerns.    Return visit  6  Months  Earlie Server, MD, PhD Hematology Oncology [facility] at [facility] Pager- [phone] 09/04/2020

## 2020-09-04 NOTE — H&P (View-Only) (Signed)
Hematology/Oncology follow up note [facility] Telephone:([phone]) [phone] Fax:([phone]) [phone]   Patient Care Team: Patient, No Pcp Per (Inactive) as PCP - General (General Practice) Earlie Server, MD as Consulting Physician (Oncology)  REFERRING PROVIDER: No ref. provider found  CHIEF COMPLAINTS/REASON FOR VISIT:  Follow up for anaplastic large cell lymphoma, ALK postive  HISTORY OF PRESENTING ILLNESS:   Angel Tate is a  30 y.o.  female with PMH listed below was seen in consultation at the request of  No ref. provider found  for evaluation of lymphoma Patient reports feeling right axillary mass in June 2021.  Initially the mass did not bother her.  Patient developed a rash in the right axillary/upper outer quadrant of the breast and had additional work-up.  Patient was treated with a 10-day course of doxycycline for possible infection.  Did not improve. 02/04/2020, targeted ultrasound showed right axillary mass measuring 6.2 x 4 by 4 cm.  There are 2 adjacent smaller axillary mass measuring 2.0 x 1.2 x 1.4 cm and 0.8 x 0.7 x 0.9 cm.  Mammogram showed dense fibroglandular.  No suspicious mass or malignant type microcalcifications or distortion detected in either breast. Patient underwent ultrasound-guided core biopsy of the right axillary mass. 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  Ki-67 80-90% Flow cytometry has insufficient cells for analysis. Patient was referred to establish care for further evaluation and management. Retrospectively, patient recalls some night sweating.  No fever, chills, unintentional weight loss.  She has good energy level and appetite.  She has 2 children.  02/17/2020 Bone marrow biopsy is negative 02/18/2020 Echocardiogram showed normal LVEF  # 03/02/2020 PET scan showed right axillary pathological adenopathy, largest lymph node 3.5 cm with maximum SUV 42.8.  An adjacent smaller peripheral right axillary lymph  node measuring 1.3 cm with SUV of 23.3. Anterior mediastinal density favors benign thymic tissue maximum SUV 3.0 which is due [place] 4, far below the level of last has lymphadenopathy.  #Possible side effects of chemotherapy was discussed with patient.  In particular I discussed about infertility.  Patient does not desire to preserve fertility.  She has IUD for contraceptive measures.  # 03/03/2020 - 05/30/2020 4 cycles of BV AVD Q21 days.  # 05/20/20 PET showed complete response. # second opinion at [facility] and was seen by Dr. Lanier Ensign who [per patient ]recommends to complete total 6 cycles of BVCHP.   # 05/31/2020 - 07/13/2020  INTERVAL HISTORY Angel Tate is a 30 y.o. female who has above history reviewed by me today presents for follow-up of anaplastic large cell lymphoma ALK positive treatment.  Her period has resumed. Hot flash has improved. No ne complaints. Had PET done.    Review of Systems  Constitutional: Negative for appetite change, chills, fatigue and fever.  HENT:   Negative for hearing loss and voice change.   Eyes: Negative for eye problems.  Respiratory: Negative for chest tightness, cough and shortness of breath.   Cardiovascular: Negative for chest pain.  Gastrointestinal: Negative for abdominal distention, abdominal pain and blood in stool.  Endocrine: Negative for hot flashes.  Genitourinary: Negative for difficulty urinating and frequency.   Musculoskeletal: Negative for arthralgias.  Skin: Negative for itching and rash.  Neurological: Negative for extremity weakness.  Hematological: Negative for adenopathy.  Psychiatric/Behavioral: Negative for confusion.    MEDICAL HISTORY:  Past Medical History:  Diagnosis Date  . Anxiety   . Depression   . Medical history non-contributory     SURGICAL  HISTORY: Past Surgical History:  Procedure Laterality Date  . DILATION AND CURETTAGE OF UTERUS    . DILATION AND EVACUATION N/A 02/17/2017   Procedure: DILATATION AND  EVACUATION;  Surgeon: Janyth Pupa, DO;  Location: [facility];  Service: Gynecology;  Laterality: N/A;  . PORTA CATH INSERTION N/A 02/23/2020   Procedure: PORTA CATH INSERTION;  Surgeon: Algernon Huxley, MD;  Location: [facility];  Service: Cardiovascular;  Laterality: N/A;    SOCIAL HISTORY: Social History   Socioeconomic History  . Marital status: Married    Spouse name: [name]  . Number of children: 1  . Years of education: Not on file  . Highest education level: Not on file  Occupational History  . Occupation: social media- marketing   Tobacco Use  . Smoking status: Never Smoker  . Smokeless tobacco: Never Used  Vaping Use  . Vaping Use: Never used  Substance and Sexual Activity  . Alcohol use: Yes    Comment: socially  . Drug use: No  . Sexual activity: Yes    Birth control/protection: None  Other Topics Concern  . Not on file  Social History Narrative  . Not on file   Social Determinants of Health   Financial Resource Strain: Not on file  Food Insecurity: Not on file  Transportation Needs: Not on file  Physical Activity: Not on file  Stress: Not on file  Social Connections: Not on file  Intimate Partner Violence: Not on file    FAMILY HISTORY: Family History  Problem Relation Age of Onset  . Hypertension Maternal Grandmother   . Asthma Maternal Aunt   . Ovarian cancer Paternal Grandmother     ALLERGIES:  has No Known Allergies.  MEDICATIONS:  Current Outpatient Medications  Medication Sig Dispense Refill  . ibuprofen (ADVIL,MOTRIN) 200 MG tablet Take 3 tablets (600 mg total) by mouth every 6 (six) hours as needed (for pain.). 30 tablet 0  . Levonorgestrel (SKYLA) 13.5 MG IUD _insert within 7 days of menses onset    . lidocaine-prilocaine (EMLA) cream Apply to affected area once 30 g 3  . loperamide (IMODIUM) 2 MG capsule Take 1 capsule (2 mg total) by mouth See admin instructions. Initial: 4 mg, followed by 2 mg every 2 to 4 hours or after each  loose stool; Max 16 mg /24 hours 60 capsule 1  . LORazepam (ATIVAN) 0.5 MG tablet Take 1 tablet (0.5 mg total) by mouth every 8 (eight) hours as needed for anxiety. 60 tablet 0  . oxyCODONE (OXY IR/ROXICODONE) 5 MG immediate release tablet Take 1 tablet (5 mg total) by mouth every 6 (six) hours as needed for moderate pain or severe pain. 30 tablet 0  . venlafaxine (EFFEXOR) 37.5 MG tablet TAKE 1 TABLET BY MOUTH EVERY DAY 90 tablet 1  . gabapentin (NEURONTIN) 100 MG capsule Take 1 capsule (100 mg total) by mouth 2 (two) times daily. (Patient not taking: No sig reported) 30 capsule 0  . ondansetron (ZOFRAN) 8 MG tablet Take 1 tablet (8 mg total) by mouth 2 (two) times daily as needed for refractory nausea / vomiting. Start on day 3 after cyclophosphamide chemotherapy. (Patient not taking: Reported on 09/03/2020) 30 tablet 1  . predniSONE (DELTASONE) 20 MG tablet Take 5 tablets (100 mg total) by mouth daily. Take on days 2-5 of chemotherapy. (Patient not taking: Reported on 09/03/2020) 20 tablet 7  . prochlorperazine (COMPAZINE) 10 MG tablet Take 1 tablet (10 mg total) by mouth every 6 (six)  hours as needed (Nausea or vomiting). (Patient not taking: Reported on 09/03/2020) 30 tablet 6  . promethazine (PHENERGAN) 25 MG tablet Take 1 tablet (25 mg total) by mouth every 6 (six) hours as needed for nausea or vomiting. (Patient not taking: Reported on 09/03/2020) 120 tablet 2   No current facility-administered medications for this visit.     PHYSICAL EXAMINATION: ECOG PERFORMANCE STATUS: 1 - Symptomatic but completely ambulatory Vitals:   09/03/20 1501  BP: 107/63  Resp: 18  Temp: 97.8 F (36.6 C)   Filed Weights   09/03/20 1501  Weight: 159 lb 3.2 oz (72.2 kg)    Physical Exam Constitutional:      General: She is not in acute distress. HENT:     Head: Normocephalic and atraumatic.  Eyes:     General: No scleral icterus. Cardiovascular:     Rate and Rhythm: Normal rate and regular rhythm.      Heart sounds: Normal heart sounds.  Pulmonary:     Effort: Pulmonary effort is normal. No respiratory distress.     Breath sounds: No wheezing.  Abdominal:     General: Bowel sounds are normal. There is no distension.     Palpations: Abdomen is soft.  Musculoskeletal:        General: No deformity. Normal range of motion.     Cervical back: Normal range of motion and neck supple.  Skin:    General: Skin is warm and dry.     Findings: No erythema or rash.  Neurological:     Mental Status: She is alert and oriented to person, place, and time. Mental status is at baseline.     Cranial Nerves: No cranial nerve deficit.     Coordination: Coordination normal.  Psychiatric:        Mood and Affect: Mood normal.     LABORATORY DATA:  I have reviewed the data as listed Lab Results  Component Value Date   WBC 5.5 09/03/2020   HGB 12.9 09/03/2020   HCT 38.9 09/03/2020   MCV 95.1 09/03/2020   PLT 229 09/03/2020   Recent Labs    06/02/20 0855 06/23/20 0825 09/03/20 1440  NA 139 139 139  K 4.2 4.2 4.4  CL 106 105 105  CO2 '24 24 27  ' GLUCOSE 115* 104* 113*  BUN '9 13 16  ' CREATININE 0.65 0.58 0.72  CALCIUM 9.2 9.2 9.0  GFRNONAA >60 >60 >60  PROT 6.9 7.2 7.3  ALBUMIN 4.0 4.2 4.1  AST '27 26 19  ' ALT '15 25 14  ' ALKPHOS 52 49 53  BILITOT 0.5 0.7 0.4   Iron/TIBC/Ferritin/ %Sat No results found for: IRON, TIBC, FERRITIN, IRONPCTSAT    RADIOGRAPHIC STUDIES: I have personally reviewed the radiological images as listed and agreed with the findings in the report. NM PET Image Restage (PS) Skull Base to Thigh  Result Date: 09/01/2020 CLINICAL DATA:  Subsequent treatment strategy for large cell lymphoma. EXAM: NUCLEAR MEDICINE PET SKULL BASE TO THIGH TECHNIQUE: 8.13 mCi F-18 FDG was injected intravenously. Full-ring PET imaging was performed from the skull base to thigh after the radiotracer. CT data was obtained and used for attenuation correction and anatomic localization. Fasting blood  glucose: 95 mg/dl COMPARISON:  05/20/2020 FINDINGS: Mediastinal blood pool activity: SUV max 1.79 Liver activity: SUV max 2.43 NECK: FDG avid brown fat is identified within the subcutaneous soft tissues of the neck and chest. No enlarged or FDG avid lymph nodes. Incidental CT findings: none CHEST: No hypermetabolic mediastinal  or hilar nodes. No suspicious pulmonary nodules on the CT scan. Incidental CT findings: Right chest wall port a catheter is noted with tip in the cavoatrial junction. ABDOMEN/PELVIS: No abnormal hypermetabolic activity within the liver, pancreas, adrenal glands, or spleen. No hypermetabolic lymph nodes in the abdomen or pelvis. Incidental CT findings: IUD identified within the uterus. SKELETON: No focal hypermetabolic activity to suggest skeletal metastasis. Interval decrease in previously noted bone marrow hypermetabolism. Incidental CT findings: none IMPRESSION: 1. No signs to suggest residual or recurrent FDG avid tumor. 2. Resolution of previously noted generalized marrow hypermetabolism. 3. FDG avid brown fat noted within the neck and chest compatible with benign physiologic activity. Electronically Signed   By: Kerby Moors M.D.   On: 09/01/2020 10:04      ASSESSMENT & PLAN:  1. 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 Port-A-Cath in place   3. 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 09/01/2020 post treatment  PET scan showed No signs to suggest residual or recurrent FDG avid tumor.Marland Kitchen Resolution of previously noted generalized marrow hypermetabolism. FDG avid brown fat noted within the neck and chest compatible with benign physiologic activity She has achieved remission.  Recommend H&P, PET scan every 6 months for 2 years, and PET annually until year 5.   #Family history of  cancer.  I have referred her to establish care with genetic counselor #Porta Cath in place. Will ask vascular surgeon to remove femoral port.  # Post chemotherapy ovarian failure, resolved. Hot flash symptoms improved.   Supportive care measures are necessary for patient well-being and will be provided as necessary. We spent sufficient time to discuss many aspect of care, questions were answered to patient's satisfaction.   All questions were answered. The patient knows to call the clinic with any problems questions or concerns.    Return visit  6  Months  Earlie Server, MD, PhD Hematology Oncology [facility] - Inpatient Pager- [phone] 09/04/2020

## 2020-09-06 ENCOUNTER — Other Ambulatory Visit: Payer: Self-pay

## 2020-09-06 ENCOUNTER — Telehealth (INDEPENDENT_AMBULATORY_CARE_PROVIDER_SITE_OTHER): Payer: Self-pay

## 2020-09-06 DIAGNOSIS — C8464 Anaplastic large cell lymphoma, ALK-positive, lymph nodes of axilla and upper limb: Secondary | ICD-10-CM

## 2020-09-06 NOTE — Telephone Encounter (Signed)
Spoke with the patient and she is scheduled with Dr. Lucky Cowboy for a port removal on 09/13/20 with a 8:45 am arrival time to the MM. Pre-procedure instructions were discussed and will be mailed.

## 2020-09-13 ENCOUNTER — Other Ambulatory Visit (INDEPENDENT_AMBULATORY_CARE_PROVIDER_SITE_OTHER): Payer: Self-pay | Admitting: Nurse Practitioner

## 2020-09-13 ENCOUNTER — Encounter
Admission: RE | Disposition: A | Discharge status: Home / Self Care | Payer: Self-pay | Source: Home / Self Care | Attending: Vascular Surgery

## 2020-09-13 ENCOUNTER — Ambulatory Visit
Admission: RE | Admit: 2020-09-13 | Discharge: 2020-09-13 | Disposition: A | Discharge status: Home / Self Care | Payer: BC Managed Care – PPO | Attending: Vascular Surgery | Admitting: Vascular Surgery

## 2020-09-13 ENCOUNTER — Encounter: Payer: Self-pay | Admitting: Vascular Surgery

## 2020-09-13 ENCOUNTER — Other Ambulatory Visit: Payer: Self-pay

## 2020-09-13 DIAGNOSIS — C8590 Non-Hodgkin lymphoma, unspecified, unspecified site: Secondary | ICD-10-CM

## 2020-09-13 DIAGNOSIS — C8460 Anaplastic large cell lymphoma, ALK-positive, unspecified site: Secondary | ICD-10-CM | POA: Diagnosis not present

## 2020-09-13 DIAGNOSIS — Z452 Encounter for adjustment and management of vascular access device: Secondary | ICD-10-CM | POA: Insufficient documentation

## 2020-09-13 DIAGNOSIS — Z79899 Other long term (current) drug therapy: Secondary | ICD-10-CM | POA: Insufficient documentation

## 2020-09-13 DIAGNOSIS — Z975 Presence of (intrauterine) contraceptive device: Secondary | ICD-10-CM | POA: Diagnosis not present

## 2020-09-13 HISTORY — PX: PORTA CATH REMOVAL: CATH118286

## 2020-09-13 SURGERY — PORTA CATH REMOVAL
Anesthesia: Moderate Sedation

## 2020-09-13 MED ORDER — ONDANSETRON HCL 4 MG/2ML IJ SOLN: 4.0000 mg | Freq: Four times a day (QID) | INTRAMUSCULAR | Status: DC | PRN

## 2020-09-13 MED ORDER — MIDAZOLAM HCL 5 MG/5ML IJ SOLN
INTRAMUSCULAR | Status: AC
  Filled 2020-09-13: qty 5

## 2020-09-13 MED ORDER — DIPHENHYDRAMINE HCL 50 MG/ML IJ SOLN: 50.0000 mg | Freq: Once | INTRAMUSCULAR | Status: DC | PRN

## 2020-09-13 MED ORDER — HYDROMORPHONE HCL 1 MG/ML IJ SOLN: 1.0000 mg | Freq: Once | INTRAMUSCULAR | Status: DC | PRN

## 2020-09-13 MED ORDER — METHYLPREDNISOLONE SODIUM SUCC 125 MG IJ SOLR: 125.0000 mg | Freq: Once | INTRAMUSCULAR | Status: DC | PRN

## 2020-09-13 MED ORDER — CEFAZOLIN SODIUM-DEXTROSE 2-4 GM/100ML-% IV SOLN
INTRAVENOUS | Status: AC
  Filled 2020-09-13: qty 100

## 2020-09-13 MED ORDER — MIDAZOLAM HCL 2 MG/ML PO SYRP: 8.0000 mg | ORAL_SOLUTION | Freq: Once | ORAL | Status: DC | PRN

## 2020-09-13 MED ORDER — SODIUM CHLORIDE 0.9 % IV SOLN: INTRAVENOUS | Status: DC

## 2020-09-13 MED ORDER — FENTANYL CITRATE (PF) 100 MCG/2ML IJ SOLN
INTRAMUSCULAR | Status: AC
  Filled 2020-09-13: qty 2

## 2020-09-13 MED ORDER — FENTANYL CITRATE (PF) 100 MCG/2ML IJ SOLN
INTRAMUSCULAR | Status: DC | PRN
  Administered 2020-09-13 (×2): 25 ug via INTRAVENOUS

## 2020-09-13 MED ORDER — MIDAZOLAM HCL 2 MG/2ML IJ SOLN
INTRAMUSCULAR | Status: DC | PRN
  Administered 2020-09-13 (×3): 1 mg via INTRAVENOUS

## 2020-09-13 MED ORDER — CHLORHEXIDINE GLUCONATE CLOTH 2 % EX PADS
6.0000 | MEDICATED_PAD | Freq: Every day | CUTANEOUS | Status: DC
  Administered 2020-09-13: 6 via TOPICAL

## 2020-09-13 MED ORDER — CEFAZOLIN SODIUM-DEXTROSE 2-4 GM/100ML-% IV SOLN
2.0000 g | Freq: Once | INTRAVENOUS | Status: AC
  Administered 2020-09-13: 2 g via INTRAVENOUS

## 2020-09-13 MED ORDER — FAMOTIDINE 20 MG PO TABS: 40.0000 mg | ORAL_TABLET | Freq: Once | ORAL | Status: DC | PRN

## 2020-09-13 SURGICAL SUPPLY — 8 items
DERMABOND ADVANCED (GAUZE/BANDAGES/DRESSINGS) ×1
DERMABOND ADVANCED .7 DNX12 (GAUZE/BANDAGES/DRESSINGS) ×1 IMPLANT
PACK ANGIOGRAPHY (CUSTOM PROCEDURE TRAY) ×2 IMPLANT
PENCIL ELECTRO HAND CTR (MISCELLANEOUS) ×2 IMPLANT
SPONGE XRAY 4X4 16PLY STRL (MISCELLANEOUS) ×2 IMPLANT
SUT MNCRL AB 4-0 PS2 18 (SUTURE) ×2 IMPLANT
SUT VIC AB 3-0 SH 27 (SUTURE) ×1
SUT VIC AB 3-0 SH 27X BRD (SUTURE) ×1 IMPLANT

## 2020-09-13 NOTE — Op Note (Signed)
[facility]       Operative Note  Date: 09/13/2020  Preoperative diagnosis:  1. Lymphoma, completed therapy and no longer using port  Postoperative diagnosis:  Same as above  Procedures: #1. Removal of right jugular port a cath   Surgeon: [name], MD  Anesthesia: Local with moderate conscious sedation for 13 minutes using 3 mg of Versed and 50 mcg of Fentanyl  Fluoroscopy time: none  Contrast used: 0  Estimated blood loss: Minimal  Indication for the procedure:  The patient is a 30 y.o. female who has completed her lymphoma treatment and no longer needs their Port-A-Cath. The patient desires to have this removed. Risks and benefits including need for potential replacement with recurrent disease were discussed and patient is agreeable to proceed.  Description of procedure: The patient was brought to the vascular and interventional radiology suite. Moderate conscious sedation was administered during a face to face encounter with the patient throughout the procedure with my supervision of the RN administering medicines and monitoring the patient's vital signs, pulse oximetry, telemetry and mental status throughout from the start of the procedure until the patient was taken to the recovery room.  The right neck, chest and shoulder were sterilely prepped and draped, and a sterile surgical field was created. The area was then anesthetized with 1% lidocaine copiously. The previous incision was reopened and electrocautery used to dissected down to the port and the catheter. These were dissected free and the catheter was gently removed from the vein in its entirety. The port was dissected out from the fibrous connective tissue and the Prolene sutures were removed. The port was then removed in its entirety including the catheter. The wound was then closed with a 3-0 Vicryl and a 4-0 Monocryl and Dermabond was placed as a dressing. The  patient was then taken to the recovery room in stable condition having tolerated the procedure well.  Complications: none  Condition: stable   [name], MD 09/13/2020 10:29 AM   This note was created with Dragon Medical transcription system. Any errors in dictation are purely unintentional.

## 2020-09-13 NOTE — Interval H&P Note (Signed)
History and Physical Interval Note:  09/13/2020 8:55 AM  Angel Tate  has presented today for surgery, with the diagnosis of Port Removal   Lymphoma of lymph nodes large cell.  The various methods of treatment have been discussed with the patient and family. After consideration of risks, benefits and other options for treatment, the patient has consented to  Procedure(s): PORTA CATH REMOVAL (N/A) as a surgical intervention.  The patient's history has been reviewed, patient examined, no change in status, stable for surgery.  I have reviewed the patient's chart and labs.  Questions were answered to the patient's satisfaction.     [name]

## 2020-09-20 ENCOUNTER — Ambulatory Visit: Payer: BC Managed Care – PPO

## 2020-09-22 ENCOUNTER — Ambulatory Visit: Payer: BC Managed Care – PPO | Admitting: Oncology

## 2020-09-22 ENCOUNTER — Other Ambulatory Visit: Payer: BC Managed Care – PPO

## 2021-01-11 ENCOUNTER — Encounter: Payer: Self-pay | Admitting: Oncology

## 2021-03-01 ENCOUNTER — Other Ambulatory Visit: Payer: Self-pay

## 2021-03-01 ENCOUNTER — Ambulatory Visit
Admission: RE | Admit: 2021-03-01 | Discharge: 2021-03-01 | Disposition: A | Discharge status: Home / Self Care | Payer: BC Managed Care – PPO | Source: Ambulatory Visit | Attending: Oncology | Admitting: Oncology

## 2021-03-01 DIAGNOSIS — C8464 Anaplastic large cell lymphoma, ALK-positive, lymph nodes of axilla and upper limb: Secondary | ICD-10-CM | POA: Insufficient documentation

## 2021-03-01 LAB — GLUCOSE, CAPILLARY: Glucose-Capillary: 84 mg/dL (ref 70–99)

## 2021-03-01 MED ORDER — FLUDEOXYGLUCOSE F - 18 (FDG) INJECTION
9.4200 | Freq: Once | INTRAVENOUS | Status: AC | PRN
  Administered 2021-03-01: 9.42 via INTRAVENOUS

## 2021-03-08 ENCOUNTER — Other Ambulatory Visit: Payer: Self-pay

## 2021-03-08 ENCOUNTER — Inpatient Hospital Stay (HOSPITAL_BASED_OUTPATIENT_CLINIC_OR_DEPARTMENT_OTHER): Payer: BC Managed Care – PPO | Admitting: Oncology

## 2021-03-08 ENCOUNTER — Inpatient Hospital Stay: Payer: BC Managed Care – PPO | Attending: Oncology

## 2021-03-08 ENCOUNTER — Encounter: Payer: Self-pay | Admitting: Oncology

## 2021-03-08 VITALS — BP 123/53 | HR 87 | Temp 98.7°F | Resp 18 | Wt 165.0 lb

## 2021-03-08 DIAGNOSIS — Z809 Family history of malignant neoplasm, unspecified: Secondary | ICD-10-CM | POA: Insufficient documentation

## 2021-03-08 DIAGNOSIS — Z9221 Personal history of antineoplastic chemotherapy: Secondary | ICD-10-CM | POA: Diagnosis not present

## 2021-03-08 DIAGNOSIS — Z8572 Personal history of non-Hodgkin lymphomas: Secondary | ICD-10-CM | POA: Insufficient documentation

## 2021-03-08 DIAGNOSIS — C8464 Anaplastic large cell lymphoma, ALK-positive, lymph nodes of axilla and upper limb: Secondary | ICD-10-CM

## 2021-03-08 LAB — COMPREHENSIVE METABOLIC PANEL
ALT: 15 U/L (ref 0–44)
AST: 20 U/L (ref 15–41)
Albumin: 4.4 g/dL (ref 3.5–5.0)
Alkaline Phosphatase: 47 U/L (ref 38–126)
Anion gap: 8 (ref 5–15)
BUN: 16 mg/dL (ref 6–20)
CO2: 27 mmol/L (ref 22–32)
Calcium: 9.4 mg/dL (ref 8.9–10.3)
Chloride: 103 mmol/L (ref 98–111)
Creatinine, Ser: 0.88 mg/dL (ref 0.44–1.00)
GFR, Estimated: >60 mL/min (ref >60)
Glucose, Bld: 93 mg/dL (ref 70–99)
Potassium: 4.2 mmol/L (ref 3.5–5.1)
Sodium: 138 mmol/L (ref 135–145)
Total Bilirubin: 0.7 mg/dL (ref 0.3–1.2)
Total Protein: 7.5 g/dL (ref 6.5–8.1)

## 2021-03-08 LAB — CBC WITH DIFFERENTIAL/PLATELET
Abs Immature Granulocytes: 0.01 10*3/uL (ref 0.00–0.07)
Basophils Absolute: 0 10*3/uL (ref 0.0–0.1)
Basophils Relative: 1 %
Eosinophils Absolute: 0.1 10*3/uL (ref 0.0–0.5)
Eosinophils Relative: 1 %
HCT: 39.8 % (ref 36.0–46.0)
Hemoglobin: 13.2 g/dL (ref 12.0–15.0)
Immature Granulocytes: 0 %
Lymphocytes Relative: 29 %
Lymphs Abs: 1.7 10*3/uL (ref 0.7–4.0)
MCH: 30.8 pg (ref 26.0–34.0)
MCHC: 33.2 g/dL (ref 30.0–36.0)
MCV: 92.8 fL (ref 80.0–100.0)
Monocytes Absolute: 0.7 10*3/uL (ref 0.1–1.0)
Monocytes Relative: 12 %
Neutro Abs: 3.2 10*3/uL (ref 1.7–7.7)
Neutrophils Relative %: 57 %
Platelets: 255 10*3/uL (ref 150–400)
RBC: 4.29 MIL/uL (ref 3.87–5.11)
RDW: 12.9 % (ref 11.5–15.5)
WBC: 5.7 10*3/uL (ref 4.0–10.5)
nRBC: 0 % (ref 0.0–0.2)

## 2021-03-08 LAB — LACTATE DEHYDROGENASE: LDH: 127 U/L (ref 98–192)

## 2021-03-08 NOTE — Progress Notes (Signed)
Survivorship Care Plan visit completed.  Treatment summary reviewed and given to patient.  ASCO answers booklet reviewed and given to patient.  CARE program and Cancer Transitions discussed with patient along with other resources cancer center offers to patients and caregivers.  Patient verbalized understanding.    

## 2021-03-08 NOTE — Progress Notes (Signed)
Hematology/Oncology follow up note [phone] [phone]   Patient Care Team: Patient, No Pcp Per (Inactive) as PCP - General (General Practice) Earlie Server, MD as Consulting Physician (Oncology)  REFERRING PROVIDER: No ref. provider found  CHIEF COMPLAINTS/REASON FOR VISIT:  Follow up for anaplastic large cell lymphoma, ALK postive  HISTORY OF PRESENTING ILLNESS:   Angel Tate is a  30 y.o.  female with PMH listed below was seen in consultation at the request of  No ref. provider found  for evaluation of lymphoma Patient reports feeling right axillary mass in June 2021.  Initially the mass did not bother her.  Patient developed a rash in the right axillary/upper outer quadrant of the breast and had additional work-up.  Patient was treated with a 10-day course of doxycycline for possible infection.  Did not improve. 02/04/2020, targeted ultrasound showed right axillary mass measuring 6.2 x 4 by 4 cm.  There are 2 adjacent smaller axillary mass measuring 2.0 x 1.2 x 1.4 cm and 0.8 x 0.7 x 0.9 cm.  Mammogram showed dense fibroglandular.  No suspicious mass or malignant type microcalcifications or distortion detected in either breast. Patient underwent ultrasound-guided core biopsy of the right axillary mass. 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  Ki-67 80-90% Flow cytometry has insufficient cells for analysis. Patient was referred to establish care for further evaluation and management. Retrospectively, patient recalls some night sweating.  No fever, chills, unintentional weight loss.  She has good energy level and appetite.  She has 2 children.  02/17/2020 Bone marrow biopsy is negative 02/18/2020 Echocardiogram showed normal LVEF  # 03/02/2020 PET scan showed right axillary pathological adenopathy, largest lymph node 3.5 cm with maximum SUV 42.8.  An adjacent smaller peripheral right axillary lymph node measuring 1.3 cm with SUV of  23.3. Anterior mediastinal density favors benign thymic tissue maximum SUV 3.0 which is due [place] 4, far below the level of last has lymphadenopathy.  #Possible side effects of chemotherapy was discussed with patient.  In particular I discussed about infertility.  Patient does not desire to preserve fertility.  She has IUD for contraceptive measures.  # 03/03/2020 - 05/30/2020 4 cycles of BV AVD Q21 days.  # 05/20/20 PET showed complete response. # second opinion at [facility] and was seen by Dr. Lanier Ensign who [per patient ]recommends to complete total 6 cycles of BVCHP.   # 05/31/2020 - 07/13/2020  INTERVAL HISTORY Angel Tate is a 30 y.o. female who has above history reviewed by me today presents for follow-up of anaplastic large cell lymphoma ALK positive treatment. Patient reports today.  Denies any constitutional symptoms.  Femoral port has been discontinued by vascular surgery in May 2022.   Review of Systems  Constitutional:  Negative for appetite change, chills, fatigue and fever.  HENT:   Negative for hearing loss and voice change.   Eyes:  Negative for eye problems.  Respiratory:  Negative for chest tightness, cough and shortness of breath.   Cardiovascular:  Negative for chest pain.  Gastrointestinal:  Negative for abdominal distention, abdominal pain and blood in stool.  Endocrine: Negative for hot flashes.  Genitourinary:  Negative for difficulty urinating and frequency.   Musculoskeletal:  Negative for arthralgias.  Skin:  Negative for itching and rash.  Neurological:  Negative for extremity weakness.  Hematological:  Negative for adenopathy.  Psychiatric/Behavioral:  Negative for confusion.    MEDICAL HISTORY:  Past Medical History:  Diagnosis Date   Anxiety    Depression  Medical history non-contributory     SURGICAL HISTORY: Past Surgical History:  Procedure Laterality Date   DILATION AND CURETTAGE OF UTERUS     DILATION AND EVACUATION N/A 02/17/2017    Procedure: DILATATION AND EVACUATION;  Surgeon: Janyth Pupa, DO;  Location: [facility];  Service: Gynecology;  Laterality: N/A;   PORTA CATH INSERTION N/A 02/23/2020   Procedure: PORTA CATH INSERTION;  Surgeon: Algernon Huxley, MD;  Location: [facility];  Service: Cardiovascular;  Laterality: N/A;   PORTA CATH REMOVAL N/A 09/13/2020   Procedure: PORTA CATH REMOVAL;  Surgeon: Algernon Huxley, MD;  Location: [facility];  Service: Cardiovascular;  Laterality: N/A;    SOCIAL HISTORY: Social History   Socioeconomic History   Marital status: Married    Spouse name: Cody   Number of children: 1   Years of education: Not on file   Highest education level: Not on file  Occupational History   Occupation: social media- marketing   Tobacco Use   Smoking status: Never   Smokeless tobacco: Never  Vaping Use   Vaping Use: Never used  Substance and Sexual Activity   Alcohol use: Yes    Comment: socially   Drug use: No   Sexual activity: Yes    Birth control/protection: None  Other Topics Concern   Not on file  Social History Narrative   Not on file   Social Determinants of Health   Financial Resource Strain: Not on file  Food Insecurity: Not on file  Transportation Needs: Not on file  Physical Activity: Not on file  Stress: Not on file  Social Connections: Not on file  Intimate Partner Violence: Not on file    FAMILY HISTORY: Family History  Problem Relation Age of Onset   Hypertension Maternal Grandmother    Asthma Maternal Aunt    Ovarian cancer Paternal Grandmother     ALLERGIES:  has No Known Allergies.  MEDICATIONS:  Current Outpatient Medications  Medication Sig Dispense Refill   gabapentin (NEURONTIN) 100 MG capsule Take 1 capsule (100 mg total) by mouth 2 (two) times daily. (Patient not taking: No sig reported) 30 capsule 0   ibuprofen (ADVIL,MOTRIN) 200 MG tablet Take 3 tablets (600 mg total) by mouth every 6 (six) hours as needed (for pain.). (Patient  not taking: Reported on 03/08/2021) 30 tablet 0   Levonorgestrel (SKYLA) 13.5 MG IUD _insert within 7 days of menses onset (Patient not taking: Reported on 03/08/2021)     lidocaine-prilocaine (EMLA) cream Apply to affected area once (Patient not taking: Reported on 03/08/2021) 30 g 3   loperamide (IMODIUM) 2 MG capsule Take 1 capsule (2 mg total) by mouth See admin instructions. Initial: 4 mg, followed by 2 mg every 2 to 4 hours or after each loose stool; Max 16 mg /24 hours (Patient not taking: No sig reported) 60 capsule 1   LORazepam (ATIVAN) 0.5 MG tablet Take 1 tablet (0.5 mg total) by mouth every 8 (eight) hours as needed for anxiety. (Patient not taking: No sig reported) 60 tablet 0   ondansetron (ZOFRAN) 8 MG tablet Take 1 tablet (8 mg total) by mouth 2 (two) times daily as needed for refractory nausea / vomiting. Start on day 3 after cyclophosphamide chemotherapy. (Patient not taking: No sig reported) 30 tablet 1   oxyCODONE (OXY IR/ROXICODONE) 5 MG immediate release tablet Take 1 tablet (5 mg total) by mouth every 6 (six) hours as needed for moderate pain or severe pain. (Patient not  taking: No sig reported) 30 tablet 0   predniSONE (DELTASONE) 20 MG tablet Take 5 tablets (100 mg total) by mouth daily. Take on days 2-5 of chemotherapy. (Patient not taking: No sig reported) 20 tablet 7   prochlorperazine (COMPAZINE) 10 MG tablet Take 1 tablet (10 mg total) by mouth every 6 (six) hours as needed (Nausea or vomiting). (Patient not taking: No sig reported) 30 tablet 6   promethazine (PHENERGAN) 25 MG tablet Take 1 tablet (25 mg total) by mouth every 6 (six) hours as needed for nausea or vomiting. (Patient not taking: No sig reported) 120 tablet 2   venlafaxine (EFFEXOR) 37.5 MG tablet TAKE 1 TABLET BY MOUTH EVERY DAY (Patient not taking: No sig reported) 90 tablet 1   No current facility-administered medications for this visit.     PHYSICAL EXAMINATION: ECOG PERFORMANCE STATUS: 1 - Symptomatic  but completely ambulatory Vitals:   03/08/21 1434  BP: (!) 123/53  Pulse: 87  Resp: 18  Temp: 98.7 F (37.1 C)  SpO2: 99%   Filed Weights   03/08/21 1434  Weight: 165 lb (74.8 kg)    Physical Exam Constitutional:      General: She is not in acute distress. HENT:     Head: Normocephalic and atraumatic.  Eyes:     General: No scleral icterus. Cardiovascular:     Rate and Rhythm: Normal rate and regular rhythm.     Heart sounds: Normal heart sounds.  Pulmonary:     Effort: Pulmonary effort is normal. No respiratory distress.     Breath sounds: No wheezing.  Abdominal:     General: Bowel sounds are normal. There is no distension.     Palpations: Abdomen is soft.  Musculoskeletal:        General: No deformity. Normal range of motion.     Cervical back: Normal range of motion and neck supple.  Skin:    General: Skin is warm and dry.     Findings: No erythema or rash.  Neurological:     Mental Status: She is alert and oriented to person, place, and time. Mental status is at baseline.     Cranial Nerves: No cranial nerve deficit.     Coordination: Coordination normal.  Psychiatric:        Mood and Affect: Mood normal.    LABORATORY DATA:  I have reviewed the data as listed Lab Results  Component Value Date   WBC 5.7 03/08/2021   HGB 13.2 03/08/2021   HCT 39.8 03/08/2021   MCV 92.8 03/08/2021   PLT 255 03/08/2021   Recent Labs    06/23/20 0825 09/03/20 1440 03/08/21 1416  NA 139 139 138  K 4.2 4.4 4.2  CL 105 105 103  CO2 '24 27 27  ' GLUCOSE 104* 113* 93  BUN '13 16 16  ' CREATININE 0.58 0.72 0.88  CALCIUM 9.2 9.0 9.4  GFRNONAA >60 >60 >60  PROT 7.2 7.3 7.5  ALBUMIN 4.2 4.1 4.4  AST '26 19 20  ' ALT '25 14 15  ' ALKPHOS 49 53 47  BILITOT 0.7 0.4 0.7    Iron/TIBC/Ferritin/ %Sat No results found for: IRON, TIBC, FERRITIN, IRONPCTSAT    RADIOGRAPHIC STUDIES: I have personally reviewed the radiological images as listed and agreed with the findings in the  report. NM PET Image Restage (PS) Skull Base to Thigh  Result Date: 03/01/2021 CLINICAL DATA:  Subsequent treatment strategy for large cell lymphoma of the axilla. EXAM: NUCLEAR MEDICINE PET SKULL BASE TO THIGH  TECHNIQUE: 9.4 mCi F-18 FDG was injected intravenously. Full-ring PET imaging was performed from the skull base to thigh after the radiotracer. CT data was obtained and used for attenuation correction and anatomic localization. Fasting blood glucose: 84 mg/dl COMPARISON:  PET-CT dated 09/01/2020 FINDINGS: Mediastinal blood pool activity: SU 2.3 v max 2.3 Liver activity: SUV max NA NECK: No hypermetabolic cervical lymphadenopathy. Incidental CT findings: none CHEST: No suspicious pulmonary nodules. No hypermetabolic thoracic lymphadenopathy. Incidental CT findings: none ABDOMEN/PELVIS: No abnormal hypermetabolism in the liver, spleen, pancreas, or adrenal glands. No hypermetabolic abdominopelvic lymphadenopathy. Incidental CT findings: IUD in satisfactory position. SKELETON: No focal hypermetabolic activity to suggest skeletal metastasis. Incidental CT findings: none IMPRESSION: No evidence of active lymphoma. Electronically Signed   By: Julian Hy M.D.   On: 03/01/2021 20:36       ASSESSMENT & PLAN:  1. 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 03/01/2021, PET scan showed no evidence of active lymphoma.  Patient remains in remission. Recommend H&P, labs PET scan in 6 months.  #Family history of cancer.  I have referred her to establish care with genetic counselor #Patient has concern about her breasts I will refer patient to establish care with plastic surgeon.  Supportive care measures are necessary for patient well-being and will be provided as necessary. We spent sufficient time to discuss many  aspect of care, questions were answered to patient's satisfaction.   All questions were answered. The patient knows to call the clinic with any problems questions or concerns.    Return visit  6  Months  Earlie Server, MD, PhD Hematology Oncology 03/08/2021

## 2021-09-05 ENCOUNTER — Ambulatory Visit: Payer: BC Managed Care – PPO

## 2021-09-07 ENCOUNTER — Inpatient Hospital Stay: Payer: BC Managed Care – PPO

## 2021-09-07 ENCOUNTER — Inpatient Hospital Stay: Payer: BC Managed Care – PPO | Admitting: Internal Medicine

## 2021-10-12 ENCOUNTER — Ambulatory Visit
Admission: RE | Admit: 2021-10-12 | Discharge: 2021-10-12 | Disposition: A | Discharge status: Home / Self Care | Payer: BC Managed Care – PPO | Source: Ambulatory Visit | Attending: Oncology | Admitting: Oncology

## 2021-10-12 DIAGNOSIS — N83202 Unspecified ovarian cyst, left side: Secondary | ICD-10-CM | POA: Diagnosis not present

## 2021-10-12 DIAGNOSIS — C8464 Anaplastic large cell lymphoma, ALK-positive, lymph nodes of axilla and upper limb: Secondary | ICD-10-CM | POA: Insufficient documentation

## 2021-10-12 LAB — GLUCOSE, CAPILLARY: Glucose-Capillary: 105 mg/dL — ABNORMAL HIGH (ref 70–99)

## 2021-10-12 IMAGING — PT NM PET TUM IMG RESTAG (PS) SKULL BASE T - THIGH
1 series · 2 of 2 positions shown · non-contrast
Comparison: PET-CT 03/01/2021 on PET-CT 03/02/2020

CLINICAL DATA: Subsequent treatment strategy for lymphoma.
anaplastic
ALK-positive large-cell lymphoma status post chemotherapy. Original
lymphoma adenopathy in the RIGHT axilla.

EXAM:
NUCLEAR MEDICINE PET SKULL BASE TO THIGH
TECHNIQUE: 9.4 mCi F-18 FDG was injected intravenously. Full-ring PET imaging
was performed from the skull base to thigh after the radiotracer. CT
data was obtained and used for attenuation correction and anatomic
localization.
Fasting blood glucose: 105 mg/dl

[Series 8671: results mm oncology reading · 4.0mm · 1.01mm/px · 2 of 2 slices shown]
[im 1/2]
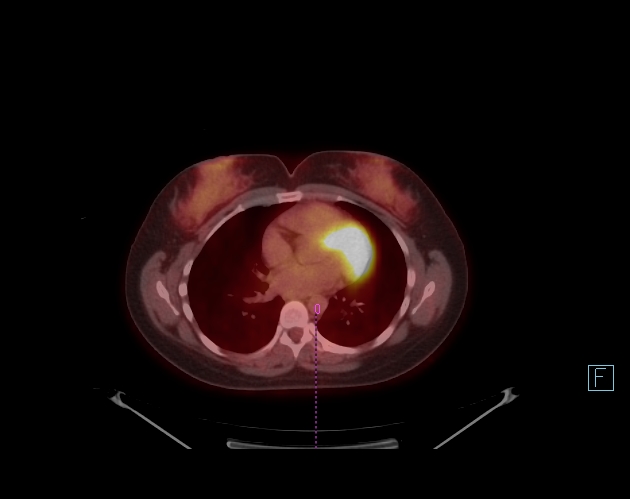
[im 2/2]
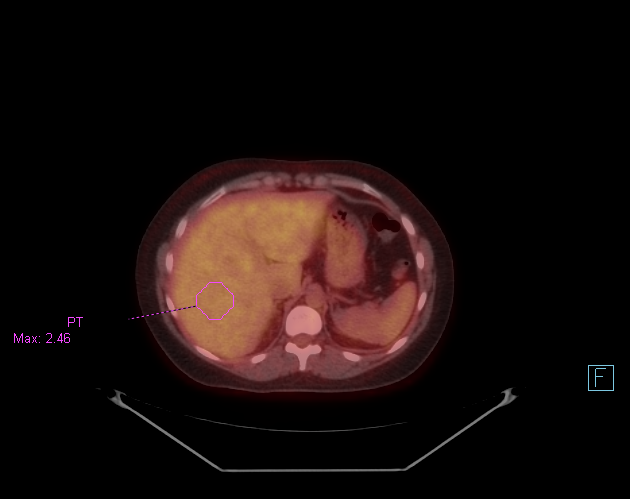

[2 of 2 positions shown; findings below may reference images not displayed]

FINDINGS: Mediastinal blood pool activity: SUV max

Liver activity: SUV max

NECK: No hypermetabolic lymph nodes in the neck.

Incidental CT findings: none

CHEST: No hypermetabolic mediastinal or hilar nodes. No suspicious
pulmonary nodules on the CT scan. No enlarged or hypermetabolic
axillary lymph nodes.

Incidental CT findings: none

ABDOMEN/PELVIS: No abnormal hypermetabolic activity within the
liver, pancreas, adrenal glands, or spleen. No hypermetabolic lymph
nodes in the abdomen or pelvis.

Spleen normal volume.

Incidental CT findings: IUD in expected location. 3.3 cm simple
fluid attenuation cystic lesion of the LEFT ovary. RIGHT ovary
unremarkable.

SKELETON: No focal hypermetabolic activity to suggest skeletal
metastasis.

Incidental CT findings: none
IMPRESSION: 1. No evidence of lymphoma recurrence. No lymphadenopathy. Normal
spleen and bone marrow.
2. Particular attention directed to the RIGHT axilla.
3. Benign cyst of the LEFT ovary.

## 2021-10-12 MED ORDER — FLUDEOXYGLUCOSE F - 18 (FDG) INJECTION
8.6000 | Freq: Once | INTRAVENOUS | Status: AC | PRN
  Administered 2021-10-12: 9.37 via INTRAVENOUS

## 2021-10-15 ENCOUNTER — Other Ambulatory Visit: Payer: Self-pay | Admitting: Nurse Practitioner

## 2021-10-17 ENCOUNTER — Encounter: Payer: Self-pay | Admitting: Oncology

## 2021-10-17 ENCOUNTER — Inpatient Hospital Stay (HOSPITAL_BASED_OUTPATIENT_CLINIC_OR_DEPARTMENT_OTHER): Payer: BC Managed Care – PPO | Admitting: Oncology

## 2021-10-17 ENCOUNTER — Inpatient Hospital Stay: Payer: BC Managed Care – PPO | Attending: Internal Medicine

## 2021-10-17 ENCOUNTER — Other Ambulatory Visit: Payer: Self-pay

## 2021-10-17 VITALS — BP 110/66 | HR 79 | Temp 98.4°F | Resp 18 | Wt 168.9 lb

## 2021-10-17 DIAGNOSIS — Z8041 Family history of malignant neoplasm of ovary: Secondary | ICD-10-CM | POA: Diagnosis not present

## 2021-10-17 DIAGNOSIS — C8464 Anaplastic large cell lymphoma, ALK-positive, lymph nodes of axilla and upper limb: Secondary | ICD-10-CM

## 2021-10-17 DIAGNOSIS — Z9221 Personal history of antineoplastic chemotherapy: Secondary | ICD-10-CM | POA: Diagnosis not present

## 2021-10-17 DIAGNOSIS — Z809 Family history of malignant neoplasm, unspecified: Secondary | ICD-10-CM

## 2021-10-17 DIAGNOSIS — Z8572 Personal history of non-Hodgkin lymphomas: Secondary | ICD-10-CM | POA: Insufficient documentation

## 2021-10-17 LAB — COMPREHENSIVE METABOLIC PANEL
ALT: 14 U/L (ref 0–44)
AST: 21 U/L (ref 15–41)
Albumin: 4 g/dL (ref 3.5–5.0)
Alkaline Phosphatase: 47 U/L (ref 38–126)
Anion gap: 7 (ref 5–15)
BUN: 12 mg/dL (ref 6–20)
CO2: 28 mmol/L (ref 22–32)
Calcium: 9.2 mg/dL (ref 8.9–10.3)
Chloride: 104 mmol/L (ref 98–111)
Creatinine, Ser: 0.87 mg/dL (ref 0.44–1.00)
GFR, Estimated: >60 mL/min (ref >60)
Glucose, Bld: 89 mg/dL (ref 70–99)
Potassium: 4.3 mmol/L (ref 3.5–5.1)
Sodium: 139 mmol/L (ref 135–145)
Total Bilirubin: 0.4 mg/dL (ref 0.3–1.2)
Total Protein: 7.2 g/dL (ref 6.5–8.1)

## 2021-10-17 LAB — CBC WITH DIFFERENTIAL/PLATELET
Abs Immature Granulocytes: 0.02 10*3/uL (ref 0.00–0.07)
Basophils Absolute: 0 10*3/uL (ref 0.0–0.1)
Basophils Relative: 0 %
Eosinophils Absolute: 0.1 10*3/uL (ref 0.0–0.5)
Eosinophils Relative: 1 %
HCT: 39.5 % (ref 36.0–46.0)
Hemoglobin: 13 g/dL (ref 12.0–15.0)
Immature Granulocytes: 0 %
Lymphocytes Relative: 30 %
Lymphs Abs: 1.8 10*3/uL (ref 0.7–4.0)
MCH: 31 pg (ref 26.0–34.0)
MCHC: 32.9 g/dL (ref 30.0–36.0)
MCV: 94 fL (ref 80.0–100.0)
Monocytes Absolute: 0.6 10*3/uL (ref 0.1–1.0)
Monocytes Relative: 10 %
Neutro Abs: 3.5 10*3/uL (ref 1.7–7.7)
Neutrophils Relative %: 59 %
Platelets: 233 10*3/uL (ref 150–400)
RBC: 4.2 MIL/uL (ref 3.87–5.11)
RDW: 12.5 % (ref 11.5–15.5)
WBC: 6 10*3/uL (ref 4.0–10.5)
nRBC: 0 % (ref 0.0–0.2)

## 2021-10-17 LAB — LACTATE DEHYDROGENASE: LDH: 128 U/L (ref 98–192)

## 2021-10-17 NOTE — Progress Notes (Unsigned)
Pt here for follow up. No new concerns voiced.   

## 2021-10-18 DIAGNOSIS — Z809 Family history of malignant neoplasm, unspecified: Secondary | ICD-10-CM | POA: Insufficient documentation

## 2021-10-18 NOTE — Progress Notes (Signed)
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 Repeat PET scan showed no evidence of active lymphoma.  Patient remains in remission Recommend to continue clinical surveillance..   Family history of cancer Refer to genetic counseling Orders Placed This Encounter  Procedures   CBC with Differential/Platelet    Standing Status:   Future    Standing Expiration Date:   10/18/2022   Comprehensive metabolic panel    Standing Status:   Future    Standing Expiration Date:   10/18/2022   Lactate dehydrogenase    Standing Status:   Future    Standing Expiration Date:   10/18/2022   Ambulatory referral to Genetics    Referral Priority:   Routine    Referral Type:   Consultation    Referral Reason:   Specialty Services Required    Number of Visits Requested:   1   Follow-up in 6 months.  Lab MD. All questions were answered. The patient knows to call the clinic with any problems, questions or concerns.  Earlie Server, MD, PhD [facility] Hematology Oncology 10/17/2021     CHIEF COMPLAINTS/REASON FOR VISIT:  Follow up for anaplastic large cell lymphoma, ALK postive  HISTORY OF PRESENTING ILLNESS:   Angel Tate is a  31 y.o.  female presents for follow-up of ALK positive anaplastic large cell lymphoma Oncology history summary is listed below. 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  Initially the mass did not bother her.  Patient developed a rash in the right axillary/upper outer quadrant of the breast and had additional work-up.  Patient was treated with a 10-day course of doxycycline for possible infection.  Did not improve.  #02/04/2020, targeted ultrasound showed right axillary mass measuring 6.2 x 4 by 4 cm.  There are 2 adjacent smaller axillary mass measuring 2.0 x 1.2 x 1.4 cm and 0.8 x 0.7 x 0.9 cm.  Mammogram showed dense fibroglandular.  No suspicious mass or malignant type microcalcifications or distortion detected in either breast.  # Patient underwent ultrasound-guided core biopsy of the right axillary mass. 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  Ki-67 80-90%, Flow cytometry has insufficient cells for analysis.    02/17/2020 Bone Marrow Biopsy   Bone marrow biopsy is negative   02/18/2020 Echocardiogram   Echocardiogram showed normal LVEF   03/02/2020 Imaging   PET scan showed right axillary pathological adenopathy, largest lymph node 3.5 cm with maximum SUV 42.8.  An adjacent smaller peripheral right axillary lymph node measuring 1.3 cm with SUV of 23.3. Anterior mediastinal density favors benign thymic tissue maximum SUV 3.0 which is due [place] 4, far below the level of last has lymphadenopathy.   03/03/2020 - 05/30/2020 Chemotherapy  03/03/2020 - 05/30/2020 4 cycles of BV AVD Q21 days.   Patient does not desire to preserve fertility.  She has IUD for contraceptive measures.   05/20/2020 Imaging   PET showed complete response   09/01/2020 Imaging   1. No signs to suggest residual or recurrent FDG avid tumor.2. Resolution of previously noted generalized marrow hypermetabolism.3. FDG avid brown fat noted  within the neck and chest compatible with benign physiologic activity.   03/01/2021 Imaging   PET showed No evidence of active lymphoma.   06/02/2021 - 06/25/2021 Chemotherapy   2 cycles of BV AVD Q21 days   10/13/2021 Imaging   PET showed  1. No evidence of lymphoma recurrence. No lymphadenopathy. Normal spleen and bone marrow.2. Particular attention directed to the RIGHT axilla.3. Benign cyst of the LEFT ovary    INTERVAL HISTORY Angel Tate is a 31 y.o. female who has above history reviewed by me today presents for follow-up of anaplastic large cell lymphoma ALK positive treatment. Patient is doing very well clinically.  Denies any constitutional symptoms. Appetite is good. No new complaints.   Review of Systems  Constitutional:  Negative for appetite change, chills, fatigue and fever.  HENT:   Negative for hearing loss and voice change.   Eyes:  Negative for eye problems.  Respiratory:  Negative for chest tightness, cough and shortness of breath.   Cardiovascular:  Negative for chest pain.  Gastrointestinal:  Negative for abdominal distention, abdominal pain and blood in stool.  Endocrine: Negative for hot flashes.  Genitourinary:  Negative for difficulty urinating and frequency.   Musculoskeletal:  Negative for arthralgias.  Skin:  Negative for itching and rash.  Neurological:  Negative for extremity weakness.  Hematological:  Negative for adenopathy.  Psychiatric/Behavioral:  Negative for confusion.     MEDICAL HISTORY:  Past Medical History:  Diagnosis Date   Anxiety    Depression    Medical history non-contributory     SURGICAL HISTORY: Past Surgical History:  Procedure Laterality Date   DILATION AND CURETTAGE OF UTERUS     DILATION AND EVACUATION N/A 02/17/2017   Procedure: DILATATION AND EVACUATION;  Surgeon: Janyth Pupa, DO;  Location: [facility];  Service: Gynecology;  Laterality: N/A;   PORTA CATH INSERTION N/A 02/23/2020   Procedure: PORTA CATH  INSERTION;  Surgeon: Algernon Huxley, MD;  Location: [facility];  Service: Cardiovascular;  Laterality: N/A;   PORTA CATH REMOVAL N/A 09/13/2020   Procedure: PORTA CATH REMOVAL;  Surgeon: Algernon Huxley, MD;  Location: [facility];  Service: Cardiovascular;  Laterality: N/A;    SOCIAL HISTORY: Social History   Socioeconomic History   Marital status: Married    Spouse name: Cody   Number of children: 1   Years of education: Not on file   Highest education level: Not on file  Occupational History   Occupation: social media- marketing   Tobacco Use   Smoking status: Never   Smokeless tobacco: Never  Vaping Use   Vaping Use: Never used  Substance and Sexual Activity   Alcohol use: Yes    Comment: socially   Drug use: No   Sexual activity: Yes    Birth control/protection: None  Other Topics Concern   Not on file  Social History Narrative   Not on file   Social Determinants of Health   Financial Resource Strain: Low Risk  (01/16/2019)   Overall Financial Resource Strain (CARDIA)    Difficulty of Paying Living Expenses: Not hard  at all  Food Insecurity: No Food Insecurity (01/16/2019)   Hunger Vital Sign    Worried About Running Out of Food in the Last Year: Never true    Ran Out of Food in the Last Year: Never true  Transportation Needs: No Transportation Needs (01/16/2019)   PRAPARE - Hydrologist (Medical): No    Lack of Transportation (Non-Medical): No  Physical Activity: Unknown (01/19/2019)   Exercise Vital Sign    Days of Exercise per Week: Patient refused    Minutes of Exercise per Session: Patient refused  Stress: No Stress Concern Present (01/19/2019)   [place]    Feeling of Stress : Only a little  Social Connections: Unknown (01/19/2019)   Social Connection and Isolation Panel [NHANES]    Frequency of Communication with Friends and Family: Patient refused     Frequency of Social Gatherings with Friends and Family: Patient refused    Attends Religious Services: Patient refused    Active Member of Clubs or Organizations: Patient refused    Attends Archivist Meetings: Patient refused    Marital Status: Patient refused  Intimate Partner Violence: Not At Risk (01/19/2019)   Humiliation, Afraid, Rape, and Kick questionnaire    Fear of Current or Ex-Partner: No    Emotionally Abused: No    Physically Abused: No    Sexually Abused: No    FAMILY HISTORY: Family History  Problem Relation Age of Onset   Hypertension Maternal Grandmother    Asthma Maternal Aunt    Ovarian cancer Paternal Grandmother     ALLERGIES:  has No Known Allergies.  MEDICATIONS:  Current Outpatient Medications  Medication Sig Dispense Refill   ALPRAZolam (XANAX) 0.5 MG tablet Take 0.5 mg by mouth at bedtime as needed for anxiety.     escitalopram (LEXAPRO) 5 MG tablet Take 5 mg by mouth daily.     Levonorgestrel (SKYLA) 13.5 MG IUD      gabapentin (NEURONTIN) 100 MG capsule Take 1 capsule (100 mg total) by mouth 2 (two) times daily. (Patient not taking: Reported on 06/02/2020) 30 capsule 0   ibuprofen (ADVIL,MOTRIN) 200 MG tablet Take 3 tablets (600 mg total) by mouth every 6 (six) hours as needed (for pain.). (Patient not taking: Reported on 03/08/2021) 30 tablet 0   loperamide (IMODIUM) 2 MG capsule Take 1 capsule (2 mg total) by mouth See admin instructions. Initial: 4 mg, followed by 2 mg every 2 to 4 hours or after each loose stool; Max 16 mg /24 hours (Patient not taking: Reported on 09/13/2020) 60 capsule 1   LORazepam (ATIVAN) 0.5 MG tablet Take 1 tablet (0.5 mg total) by mouth every 8 (eight) hours as needed for anxiety. (Patient not taking: Reported on 09/13/2020) 60 tablet 0   oxyCODONE (OXY IR/ROXICODONE) 5 MG immediate release tablet Take 1 tablet (5 mg total) by mouth every 6 (six) hours as needed for moderate pain or severe pain. (Patient not taking:  Reported on 09/13/2020) 30 tablet 0   promethazine (PHENERGAN) 25 MG tablet Take 1 tablet (25 mg total) by mouth every 6 (six) hours as needed for nausea or vomiting. (Patient not taking: Reported on 09/03/2020) 120 tablet 2   venlafaxine (EFFEXOR) 37.5 MG tablet TAKE 1 TABLET BY MOUTH EVERY DAY (Patient not taking: Reported on 09/13/2020) 90 tablet 1   No current facility-administered medications for this visit.     PHYSICAL EXAMINATION: ECOG PERFORMANCE STATUS: 1 -  Symptomatic but completely ambulatory Vitals:   10/17/21 1408  BP: 110/66  Pulse: 79  Resp: 18  Temp: 98.4 F (36.9 C)   Filed Weights   10/17/21 1408  Weight: 168 lb 14.4 oz (76.6 kg)    Physical Exam Constitutional:      General: She is not in acute distress. HENT:     Head: Normocephalic and atraumatic.  Eyes:     General: No scleral icterus. Cardiovascular:     Rate and Rhythm: Normal rate and regular rhythm.     Heart sounds: Normal heart sounds.  Pulmonary:     Effort: Pulmonary effort is normal. No respiratory distress.     Breath sounds: No wheezing.  Abdominal:     General: Bowel sounds are normal. There is no distension.     Palpations: Abdomen is soft.  Musculoskeletal:        General: No deformity. Normal range of motion.     Cervical back: Normal range of motion and neck supple.  Skin:    General: Skin is warm and dry.     Findings: No erythema or rash.  Neurological:     Mental Status: She is alert and oriented to person, place, and time. Mental status is at baseline.     Cranial Nerves: No cranial nerve deficit.     Coordination: Coordination normal.  Psychiatric:        Mood and Affect: Mood normal.     LABORATORY DATA:  I have reviewed the data as listed Lab Results  Component Value Date   WBC 6.0 10/17/2021   HGB 13.0 10/17/2021   HCT 39.5 10/17/2021   MCV 94.0 10/17/2021   PLT 233 10/17/2021   Recent Labs    03/08/21 1416 10/17/21 1353  NA 138 139  K 4.2 4.3  CL 103 104   CO2 27 28  GLUCOSE 93 89  BUN 16 12  CREATININE 0.88 0.87  CALCIUM 9.4 9.2  GFRNONAA >60 >60  PROT 7.5 7.2  ALBUMIN 4.4 4.0  AST 20 21  ALT 15 14  ALKPHOS 47 47  BILITOT 0.7 0.4    Iron/TIBC/Ferritin/ %Sat No results found for: "IRON", "TIBC", "FERRITIN", "IRONPCTSAT"    RADIOGRAPHIC STUDIES: I have personally reviewed the radiological images as listed and agreed with the findings in the report. NM PET Image Restag (PS) Skull Base To Thigh  Result Date: 10/13/2021 CLINICAL DATA:  Subsequent treatment strategy for lymphoma. anaplastic ALK-positive large-cell lymphoma status post chemotherapy. Original lymphoma adenopathy in the RIGHT axilla. EXAM: NUCLEAR MEDICINE PET SKULL BASE TO THIGH TECHNIQUE: 9.4 mCi F-18 FDG was injected intravenously. Full-ring PET imaging was performed from the skull base to thigh after the radiotracer. CT data was obtained and used for attenuation correction and anatomic localization. Fasting blood glucose: 105 mg/dl COMPARISON:  PET-CT 03/01/2021 on PET-CT 03/02/2020 FINDINGS: Mediastinal blood pool activity: SUV max 1.4 Liver activity: SUV max 2.5 NECK: No hypermetabolic lymph nodes in the neck. Incidental CT findings: none CHEST: No hypermetabolic mediastinal or hilar nodes. No suspicious pulmonary nodules on the CT scan. No enlarged or hypermetabolic axillary lymph nodes. Incidental CT findings: none ABDOMEN/PELVIS: No abnormal hypermetabolic activity within the liver, pancreas, adrenal glands, or spleen. No hypermetabolic lymph nodes in the abdomen or pelvis. Spleen normal volume. Incidental CT findings: IUD in expected location. 3.3 cm simple fluid attenuation cystic lesion of the LEFT ovary. RIGHT ovary unremarkable. SKELETON: No focal hypermetabolic activity to suggest skeletal metastasis. Incidental CT findings: none IMPRESSION: 1.  No evidence of lymphoma recurrence. No lymphadenopathy. Normal spleen and bone marrow. 2. Particular attention directed to the  RIGHT axilla. 3. Benign cyst of the LEFT ovary. Electronically Signed   By: Suzy Bouchard M.D.   On: 10/13/2021 16:33

## 2021-10-18 NOTE — Assessment & Plan Note (Signed)
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 Repeat PET scan showed no evidence of active lymphoma.  Patient remains in remission Recommend to continue clinical surveillance.Marland Kitchen

## 2021-10-18 NOTE — Assessment & Plan Note (Signed)
Refer to genetic counseling. 

## 2021-11-02 NOTE — Progress Notes (Unsigned)
REFERRING PROVIDER: Earlie Server, MD [place],  [place] [address]  PRIMARY PROVIDER:  Patient, No Pcp Per  PRIMARY REASON FOR VISIT:  1. 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 Family history of ovarian cancer      HISTORY OF PRESENT ILLNESS:   Ms. Metzger, a 31 y.o. female, was seen for a [place] cancer genetics consultation at the request of Dr. Tasia Catchings due to a family history of ovarian cancer.  Ms. Learn presents to clinic today to discuss the possibility of a hereditary predisposition to cancer, genetic testing, and to further clarify her future cancer risks, as well as potential cancer risks for family members.   At the age of 76, Ms. Virrueta was diagnosed with anaplastic ALK-positive large cell lymphoma of the lymph node of axilla.  This was treated with chemotherapy. Imaging in June 2023 showed no evidence of lymphoma recurrence.  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  Initially the mass did not bother her.  Patient developed a rash in the right axillary/upper outer quadrant of the breast and had additional work-up.  Patient was treated with a 10-day course of doxycycline for possible infection.  Did not improve.  #02/04/2020, targeted ultrasound showed right axillary mass measuring 6.2 x 4 by 4 cm.  There are 2 adjacent smaller axillary mass measuring 2.0 x 1.2 x 1.4 cm and 0.8 x 0.7 x 0.9 cm.  Mammogram showed dense fibroglandular.  No suspicious mass or malignant type microcalcifications or  distortion detected in either breast.  # Patient underwent ultrasound-guided core biopsy of the right axillary mass. 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  Ki-67 80-90%, Flow cytometry has insufficient cells for analysis.    02/17/2020 Bone Marrow Biopsy   Bone marrow biopsy is negative   02/18/2020 Echocardiogram   Echocardiogram showed normal LVEF   03/02/2020 Imaging   PET scan showed right axillary pathological adenopathy, largest lymph node 3.5 cm with maximum SUV 42.8.  An adjacent smaller peripheral right axillary lymph node measuring 1.3 cm with SUV of 23.3. Anterior mediastinal density favors benign thymic tissue maximum SUV 3.0 which is due [place] 4, far below the level of last has lymphadenopathy.   03/03/2020 - 05/30/2020 Chemotherapy   03/03/2020 - 05/30/2020 4 cycles of BV AVD Q21 days.   Patient does not desire to preserve fertility.  She has IUD for contraceptive measures.   05/20/2020 Imaging   PET showed complete response   09/01/2020 Imaging   1. No signs to suggest residual or recurrent FDG avid tumor.2. Resolution of previously noted generalized marrow hypermetabolism.3. FDG avid brown fat noted within the neck and chest compatible with benign physiologic activity.   03/01/2021 Imaging   PET showed No evidence of active lymphoma.   06/02/2021 - 06/25/2021 Chemotherapy   2 cycles of BV AVD Q21 days   10/13/2021 Imaging   PET showed  1. No evidence of lymphoma recurrence. No lymphadenopathy. Normal spleen and bone marrow.2. Particular attention directed to the RIGHT axilla.3. Benign  cyst of the LEFT ovary      RISK FACTORS:  Menarche was at age 74.  First live birth at age 77.  OCP use for approximately 2 years.  Ovaries intact: yes.  Hysterectomy: no.  Menopausal status: premenopausal.  HRT use: 0 years. Colonoscopy: no; not examined. Mammogram within the last year: no. Number of breast biopsies: 0. Up to date with pelvic  exams: yes.  Past Medical History:  Diagnosis Date   Anxiety    Depression    Medical history non-contributory     Past Surgical History:  Procedure Laterality Date   DILATION AND CURETTAGE OF UTERUS     DILATION AND EVACUATION N/A 02/17/2017   Procedure: DILATATION AND EVACUATION;  Surgeon: Janyth Pupa, DO;  Location: [facility];  Service: Gynecology;  Laterality: N/A;   PORTA CATH INSERTION N/A 02/23/2020   Procedure: PORTA CATH INSERTION;  Surgeon: Algernon Huxley, MD;  Location: [facility];  Service: Cardiovascular;  Laterality: N/A;   PORTA CATH REMOVAL N/A 09/13/2020   Procedure: PORTA CATH REMOVAL;  Surgeon: Algernon Huxley, MD;  Location: [facility];  Service: Cardiovascular;  Laterality: N/A;   FAMILY HISTORY:  We obtained a detailed, 4-generation family history.  Significant diagnoses are listed below: Family History  Problem Relation Age of Onset   Hypertension Maternal Grandmother    Asthma Maternal Aunt    Ovarian cancer Paternal Grandmother    Ms. Marcoe has 1 son, 2, and 1 daughter, 7. She has 1 sister, 46, and 1 adopted brother, 34.   Ms. Traister's mother is living at 52. No known cancers on this side of the family aside from a second cousin with leukemia at 78.   Ms. Orman's father is living at 64 as well. Paternal grandmother had ovarian cancer in her 50s-80s and passed from it. Grandfather passed at 77.  Ms. Audino is unaware of previous family history of genetic testing for hereditary cancer risks. There is no reported Ashkenazi Jewish ancestry. There is no known consanguinity.    GENETIC COUNSELING ASSESSMENT: Ms. Cleckley is a 31 y.o. female with a family history of ovarian cancer which is somewhat suggestive of a hereditary cancer syndrome and predisposition to cancer. We, therefore, discussed and recommended the following at today's visit.   DISCUSSION: We discussed that approximately 10-20% of ovarian cancer is hereditary. Most cases  of hereditary ovarian cancer are associated with BRCA1/BRCA2 genes, although there are other genes associated with hereditary cancer as well. Cancers and risks are gene specific. Lymphoma is typically not hereditary. We discussed that testing is beneficial for several reasons including knowing about cancer risks, identifying potential screening and risk-reduction options that may be appropriate, and to understand if other family members could be at risk for cancer and allow them to undergo genetic testing.   We reviewed the characteristics, features and inheritance patterns of hereditary cancer syndromes. We also discussed genetic testing, including the appropriate family members to test, the process of testing, insurance coverage and turn-around-time for results. We discussed the implications of a negative, positive and/or variant of uncertain significant result. We recommended Ms. Klinker pursue genetic testing for the [facility] CustomNext+RNA gene panel.   Based on Ms. Gamboa's family history of cancer, she meets medical criteria for genetic testing. Despite that she meets criteria, she may still have an out of pocket cost. We discussed that if her out of pocket cost for testing is over $100, the laboratory will call and confirm whether she  wants to proceed with testing.  If the out of pocket cost of testing is less than $100 she will be billed by the genetic testing laboratory.   PLAN: After considering the risks, benefits, and limitations, Ms. Masullo provided informed consent to pursue genetic testing and the blood sample was sent to [facility] for analysis of the CustomNext+RNA panel. Results should be available within approximately 2-3 weeks' time, at which point they will be disclosed by telephone to Ms. Peed, as will any additional recommendations warranted by these results. Ms. Granato will receive a summary of her genetic counseling visit and a copy of her results once available. This  information will also be available in Epic.   Ms. Obrecht's questions were answered to her satisfaction today. Our contact information was provided should additional questions or concerns arise. Thank you for the referral and allowing [place] to share in the care of your patient.   Faith Rogue, MS, [facility] Genetic Counselor [email or web address] Phone: [phone]  The patient was seen for a total of 20 minutes in face-to-face genetic counseling.  Dr. Grayland Ormond was available for discussion regarding this case.   _______________________________________________________________________ For Office Staff:  Number of people involved in session: 1 Was an Intern/ student involved with case: no

## 2021-11-03 ENCOUNTER — Inpatient Hospital Stay: Payer: BC Managed Care – PPO | Attending: Internal Medicine | Admitting: Licensed Clinical Social Worker

## 2021-11-03 ENCOUNTER — Inpatient Hospital Stay: Payer: BC Managed Care – PPO

## 2021-11-03 ENCOUNTER — Encounter: Payer: Self-pay | Admitting: Licensed Clinical Social Worker

## 2021-11-03 DIAGNOSIS — C8464 Anaplastic large cell lymphoma, ALK-positive, lymph nodes of axilla and upper limb: Secondary | ICD-10-CM | POA: Diagnosis not present

## 2021-11-03 DIAGNOSIS — Z8041 Family history of malignant neoplasm of ovary: Secondary | ICD-10-CM

## 2021-11-17 ENCOUNTER — Ambulatory Visit: Payer: Self-pay | Admitting: Licensed Clinical Social Worker

## 2021-11-17 ENCOUNTER — Telehealth: Payer: Self-pay | Admitting: Licensed Clinical Social Worker

## 2021-11-17 ENCOUNTER — Encounter: Payer: Self-pay | Admitting: Licensed Clinical Social Worker

## 2021-11-17 DIAGNOSIS — Z1379 Encounter for other screening for genetic and chromosomal anomalies: Secondary | ICD-10-CM | POA: Insufficient documentation

## 2021-11-17 NOTE — Progress Notes (Signed)
HPI:  Ms. Biernat was previously seen in the [place] [facility] due to a family history of cancer and concerns regarding a hereditary predisposition to cancer. Please refer to our prior cancer genetics clinic note for more information regarding our discussion, assessment and recommendations, at the time. Ms. Faerber recent genetic test results were disclosed to her, as were recommendations warranted by these results. These results and recommendations are discussed in more detail below.  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  Initially the mass did not bother her.  Patient developed a rash in the right axillary/upper outer quadrant of the breast and had additional work-up.  Patient was treated with a 10-day course of doxycycline for possible infection.  Did not improve.  #02/04/2020, targeted ultrasound showed right axillary mass measuring 6.2 x 4 by 4 cm.  There are 2 adjacent smaller axillary mass measuring 2.0 x 1.2 x 1.4 cm and 0.8 x 0.7 x 0.9 cm.  Mammogram showed dense fibroglandular.  No suspicious mass or malignant type microcalcifications or distortion detected in either breast.  # Patient underwent ultrasound-guided core biopsy of the right axillary mass. 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  Ki-67 80-90%, Flow cytometry has insufficient cells for analysis.    02/17/2020 Bone Marrow Biopsy   Bone marrow biopsy is negative   02/18/2020 Echocardiogram   Echocardiogram showed normal LVEF   03/02/2020  Imaging   PET scan showed right axillary pathological adenopathy, largest lymph node 3.5 cm with maximum SUV 42.8.  An adjacent smaller peripheral right axillary lymph node measuring 1.3 cm with SUV of 23.3. Anterior mediastinal density favors benign thymic tissue maximum SUV 3.0 which is due [place] 4, far below the level of last has lymphadenopathy.   03/03/2020 - 05/30/2020 Chemotherapy   03/03/2020 - 05/30/2020 4 cycles of BV AVD Q21 days.   Patient does not desire to preserve fertility.  She has IUD for contraceptive measures.   05/20/2020 Imaging   PET showed complete response   09/01/2020 Imaging   1. No signs to suggest residual or recurrent FDG avid tumor.2. Resolution of previously noted generalized marrow hypermetabolism.3. FDG avid brown fat noted within the neck and chest compatible with benign physiologic activity.   03/01/2021 Imaging   PET showed No evidence of active lymphoma.   06/02/2021 - 06/25/2021 Chemotherapy   2 cycles of BV AVD Q21 days   10/13/2021 Imaging   PET showed  1. No evidence of lymphoma recurrence. No lymphadenopathy. Normal spleen and bone marrow.2. Particular attention directed to the RIGHT axilla.3. Benign cyst of the LEFT ovary     FAMILY HISTORY:  We obtained a detailed, 4-generation family history.  Significant diagnoses are listed below: Family History  Problem Relation Age of Onset   Asthma Maternal Aunt    Hypertension Maternal Grandmother    Ovarian cancer Paternal Grandmother        dx 19s-80s    Ms. Fenlon has 1 son, 2, and 1 daughter, 7. She has 1 sister, 48, and 1 adopted brother, 75.  Ms. Rinks's mother is living at 67. No known cancers on this side of the family aside from a second cousin with leukemia at 30.    Ms. Grullon's father is living at 24 as well. Paternal grandmother had ovarian cancer in her 2s-80s and passed from it. Grandfather passed at 71.   Ms. Brenner is unaware of previous family history of genetic testing  for hereditary cancer risks. There is no reported Ashkenazi Jewish ancestry. There is no known consanguinity.       GENETIC TEST RESULTS: Genetic testing reported out on 11/16/2021 through the [facility] CustomNext+RNA cancer panel found no pathogenic mutations.   The CustomNext-Cancer+RNAinsight panel offered by [facility] includes sequencing and rearrangement analysis for the following 47 genes:  APC, ATM, AXIN2, BARD1, BMPR1A, BRCA1, BRCA2, BRIP1, CDH1, CDK4, CDKN2A, CHEK2, DICER1, EPCAM, GREM1, HOXB13, MEN1, MLH1, MSH2, MSH3, MSH6, MUTYH, NBN, NF1, NF2, NTHL1, PALB2, PMS2, POLD1, POLE, PTEN, RAD51C, RAD51D, RECQL, RET, SDHA, SDHAF2, SDHB, SDHC, SDHD, SMAD4, SMARCA4, STK11, TP53, TSC1, TSC2, and VHL.  RNA data is routinely analyzed for use in variant interpretation for all genes.  The test report has been scanned into EPIC and is located under the Molecular Pathology section of the Results Review tab.  A portion of the result report is included below for reference.    We discussed that because current genetic testing is not perfect, it is possible there may be a gene mutation in one of these genes that current testing cannot detect, but that chance is small.  There could be another gene that has not yet been discovered, or that we have not yet tested, that is responsible for the cancer diagnoses in the family. It is also possible there is a hereditary cause for the cancer in the family that Ms. Pytel did not inherit and therefore was not identified in her testing.  Therefore, it is important to remain in touch with cancer genetics in the future so that we can continue to offer Ms. Kittleson the most up to date genetic testing.   Genetic testing did identify a variant of uncertain significance (VUS) in the BARD1 gene called [id number].  At this time, it is unknown if this variant is associated with increased cancer risk or if this is a normal finding, but most variants such as this get reclassified  to being inconsequential. It should not be used to make medical management decisions. With time, we suspect the lab will determine the significance of this variant, if any. If we do learn more about it we will try to contact Ms. Dorton to discuss it further. However, it is important to stay in touch with [place] periodically and keep the address and phone number up to date.  ADDITIONAL GENETIC TESTING: We discussed with Ms. Mcclay that her genetic testing was fairly extensive.  If there are genes identified to increase cancer risk that can be analyzed in the future, we would be happy to discuss and coordinate this testing at that time.    CANCER SCREENING RECOMMENDATIONS: Ms. Milosevic's test result is considered negative (normal).  This means that we have not identified a hereditary cause for her  personal and family history of cancer at this time.   While reassuring, this does not definitively rule out a hereditary predisposition to cancer. It is still possible that there could be genetic mutations that are undetectable by current technology. There could be genetic mutations in genes that have not been tested or identified to increase cancer risk.  Therefore, it is recommended she continue to follow the cancer management and screening guidelines provided by her oncology and primary healthcare provider.   An individual's cancer risk and medical management are not determined by genetic test results alone. Overall cancer risk assessment incorporates additional factors, including personal medical history, family history, and any available genetic information that may result in a personalized plan for cancer prevention and surveillance.  RECOMMENDATIONS FOR FAMILY MEMBERS:  Relatives in this family might be at some increased risk of developing cancer, over the general population risk, simply due to the family history of cancer.  We recommended female relatives in this family have a yearly mammogram beginning at  age 29, or 30 years younger than the earliest onset of cancer, an annual clinical breast exam, and perform monthly breast self-exams. Female relatives in this family should also have a gynecological exam as recommended by their primary provider.  All family members should be referred for colonoscopy starting at age 68.    It is also possible there is a hereditary cause for the cancer in Ms. Holt's family that she did not inherit and therefore was not identified in her.  Based on Ms. Ramp's family history, we recommended paternal relatives have genetic counseling and testing. Ms. Lynn will let us know if we can be of any assistance in coordinating genetic counseling and/or testing for these family members.  FOLLOW-UP: Lastly, we discussed with Ms. Byrnes that cancer genetics is a rapidly advancing field and it is possible that new genetic tests will be appropriate for her and/or her family members in the future. We encouraged her to remain in contact with cancer genetics on an annual basis so we can update her personal and family histories and let her know of advances in cancer genetics that may benefit this family.   Our contact number was provided. Ms. Sandra's questions were answered to her satisfaction, and she knows she is welcome to call us at anytime with additional questions or concerns.   Faith Rogue, MS, [facility] Genetic Counselor [email or web address] Phone: [phone]

## 2021-11-17 NOTE — Telephone Encounter (Signed)
Revealed negative genetic testing.  Revealed that a VUS in BARD1 was identified. This normal result is reassuring.  It is unlikely that there is an increased risk of another cancer due to a mutation in one of these genes.  However, genetic testing is not perfect, and cannot definitively rule out a hereditary cause.  It will be important for her to keep in contact with genetics to learn if any additional testing may be needed in the future.

## 2021-12-07 ENCOUNTER — Encounter: Payer: Self-pay | Admitting: Oncology

## 2021-12-07 NOTE — Telephone Encounter (Signed)
Please advise 

## 2022-02-27 ENCOUNTER — Other Ambulatory Visit: Payer: Self-pay | Admitting: Obstetrics and Gynecology

## 2022-02-27 ENCOUNTER — Encounter (INDEPENDENT_AMBULATORY_CARE_PROVIDER_SITE_OTHER): Payer: Self-pay

## 2022-02-27 DIAGNOSIS — N6320 Unspecified lump in the left breast, unspecified quadrant: Secondary | ICD-10-CM

## 2022-03-03 ENCOUNTER — Other Ambulatory Visit: Payer: Self-pay | Admitting: Obstetrics and Gynecology

## 2022-03-03 ENCOUNTER — Ambulatory Visit
Admission: RE | Admit: 2022-03-03 | Discharge: 2022-03-03 | Disposition: A | Discharge status: Home / Self Care | Payer: BC Managed Care – PPO | Source: Ambulatory Visit | Attending: Obstetrics and Gynecology | Admitting: Obstetrics and Gynecology

## 2022-03-03 DIAGNOSIS — N6320 Unspecified lump in the left breast, unspecified quadrant: Secondary | ICD-10-CM

## 2022-03-06 ENCOUNTER — Ambulatory Visit
Admission: RE | Admit: 2022-03-06 | Discharge: 2022-03-06 | Disposition: A | Discharge status: Home / Self Care | Payer: BC Managed Care – PPO | Source: Ambulatory Visit | Attending: Obstetrics and Gynecology | Admitting: Obstetrics and Gynecology

## 2022-03-06 ENCOUNTER — Other Ambulatory Visit (HOSPITAL_COMMUNITY)
Admission: RE | Admit: 2022-03-06 | Discharge: 2022-03-06 | Disposition: A | Discharge status: Home / Self Care | Payer: BC Managed Care – PPO | Source: Ambulatory Visit | Attending: Diagnostic Radiology | Admitting: Diagnostic Radiology

## 2022-03-06 DIAGNOSIS — N6320 Unspecified lump in the left breast, unspecified quadrant: Secondary | ICD-10-CM

## 2022-03-06 HISTORY — PX: BREAST BIOPSY: SHX20

## 2022-03-08 LAB — SURGICAL PATHOLOGY

## 2022-03-09 ENCOUNTER — Telehealth: Payer: Self-pay

## 2022-03-09 DIAGNOSIS — C8464 Anaplastic large cell lymphoma, ALK-positive, lymph nodes of axilla and upper limb: Secondary | ICD-10-CM

## 2022-03-09 NOTE — Telephone Encounter (Signed)
Dr. Tasia Catchings has reveiwed biopsy results. Pt aware of results and plan,   Please schedule and inform pt of appts:   -PET ASAP -lab (urine preg) -either the day before or same day prior to going to PET -MD 2-3 days after PET.

## 2022-03-10 ENCOUNTER — Other Ambulatory Visit: Payer: BC Managed Care – PPO

## 2022-03-14 ENCOUNTER — Encounter
Admission: RE | Admit: 2022-03-14 | Discharge: 2022-03-14 | Disposition: A | Discharge status: Home / Self Care | Payer: BC Managed Care – PPO | Source: Ambulatory Visit | Attending: Oncology | Admitting: Oncology

## 2022-03-14 ENCOUNTER — Inpatient Hospital Stay: Payer: BC Managed Care – PPO | Attending: Oncology

## 2022-03-14 ENCOUNTER — Inpatient Hospital Stay: Payer: BC Managed Care – PPO

## 2022-03-14 DIAGNOSIS — C8464 Anaplastic large cell lymphoma, ALK-positive, lymph nodes of axilla and upper limb: Secondary | ICD-10-CM | POA: Insufficient documentation

## 2022-03-14 DIAGNOSIS — Z8041 Family history of malignant neoplasm of ovary: Secondary | ICD-10-CM | POA: Diagnosis not present

## 2022-03-14 LAB — GLUCOSE, CAPILLARY: Glucose-Capillary: 85 mg/dL (ref 70–99)

## 2022-03-14 LAB — PREGNANCY, URINE: Preg Test, Ur: NEGATIVE

## 2022-03-14 MED ORDER — FLUDEOXYGLUCOSE F - 18 (FDG) INJECTION
8.8000 | Freq: Once | INTRAVENOUS | Status: AC | PRN
  Administered 2022-03-14: 9.26 via INTRAVENOUS

## 2022-03-15 ENCOUNTER — Telehealth: Payer: Self-pay | Admitting: *Deleted

## 2022-03-15 NOTE — Telephone Encounter (Signed)
Called report  IMPRESSION: 1. New hypermetabolic round mass in the medial LEFT breast consistent with lymphoma recurrence. 2. New lesion in the mid body of the pancreas consistent with lymphoma recurrence/metastasis. 3. Very small hypermetabolic RIGHT supraclavicular node is concerning for lymphoma. 4. Two pulmonary nodules in the RIGHT lung are new from prior. Indeterminate nodules. Recommend close attention on follow-up.   These results will be called to the ordering clinician or representative by the Radiologist Assistant, and communication documented in the PACS or [employer].     Electronically Signed   By: Suzy Bouchard M.D.   On: 03/15/2022 12:33

## 2022-03-21 ENCOUNTER — Inpatient Hospital Stay (HOSPITAL_BASED_OUTPATIENT_CLINIC_OR_DEPARTMENT_OTHER): Payer: BC Managed Care – PPO | Admitting: Oncology

## 2022-03-21 ENCOUNTER — Encounter: Payer: Self-pay | Admitting: Oncology

## 2022-03-21 VITALS — BP 141/54 | HR 80 | Temp 100.4°F | Wt 162.6 lb

## 2022-03-21 DIAGNOSIS — C8464 Anaplastic large cell lymphoma, ALK-positive, lymph nodes of axilla and upper limb: Secondary | ICD-10-CM

## 2022-03-21 DIAGNOSIS — Z7189 Other specified counseling: Secondary | ICD-10-CM | POA: Diagnosis not present

## 2022-03-21 NOTE — Progress Notes (Unsigned)
Patient is here for follow-up of PET Scan results. 

## 2022-03-22 ENCOUNTER — Encounter: Payer: Self-pay | Admitting: Oncology

## 2022-03-22 ENCOUNTER — Telehealth: Payer: Self-pay

## 2022-03-22 ENCOUNTER — Other Ambulatory Visit: Payer: Self-pay

## 2022-03-22 DIAGNOSIS — C8464 Anaplastic large cell lymphoma, ALK-positive, lymph nodes of axilla and upper limb: Secondary | ICD-10-CM

## 2022-03-22 DIAGNOSIS — Z5181 Encounter for therapeutic drug level monitoring: Secondary | ICD-10-CM

## 2022-03-22 MED ORDER — ONDANSETRON HCL 8 MG PO TABS: 8.0000 mg | ORAL_TABLET | Freq: Three times a day (TID) | ORAL | 1 refills | Status: AC | PRN

## 2022-03-22 MED ORDER — ALLOPURINOL 300 MG PO TABS: 300.0000 mg | ORAL_TABLET | Freq: Every day | ORAL | 2 refills | Status: DC

## 2022-03-22 MED ORDER — DEXAMETHASONE 4 MG PO TABS: 8.0000 mg | ORAL_TABLET | Freq: Every day | ORAL | 1 refills | Status: DC

## 2022-03-22 MED ORDER — PROCHLORPERAZINE MALEATE 10 MG PO TABS: 10.0000 mg | ORAL_TABLET | Freq: Four times a day (QID) | ORAL | 1 refills | Status: AC | PRN

## 2022-03-22 NOTE — Progress Notes (Signed)
DISCONTINUE ON PATHWAY REGIMEN - Lymphoma and CLL     A cycle is every 21 days:     Cyclophosphamide      Doxorubicin      Prednisone      Brentuximab vedotin   **Always confirm dose/schedule in your pharmacy ordering system**  REASON: Disease Progression PRIOR TREATMENT: [id number]: A + CHP (Brentuximab + Cyclophosphamide + Doxorubicin + Prednisone) q21 Days x 6 Cycles TREATMENT RESPONSE: Progressive Disease (PD)  START OFF PATHWAY REGIMEN - Lymphoma and CLL   [id number]:ICE (Ifosfamide IV D1,2,3 + Carboplatin IV D1 + Etoposide IV D1,2,3) + G-CSF q21 Days (Outpatient):   A cycle is every 21 days:     Ifosfamide      [place]      Carboplatin      Etoposide      Pegfilgrastim-xxxx   **Always confirm dose/schedule in your pharmacy ordering system**  Patient Characteristics: T-Cell Lymphoma (Systemic), Second Line, Transplant Candidate, CD30 Positive Disease Type: Not Applicable Disease Type: T-Cell Lymphoma (Systemic) Disease Type: Not Applicable Line of Therapy: Second Line Patient Characteristics: Transplant Candidate CD30 Status: CD30 Positive Intent of Therapy: Curative Intent, Discussed with Patient

## 2022-03-22 NOTE — Telephone Encounter (Signed)
Dr. Tasia Catchings has discussed plan with patient. Pt will need MRI, echo and bone marrow biopsy.  Please schedule and inform pt of appts:   -MRI Brain w/wo  (if it can't be done within 1 week, will change order to STAT) -Echo- next avail

## 2022-03-22 NOTE — Telephone Encounter (Signed)
Received message from IR stating that pt can be scheduled for BM bx on 11/28, however ECHO will need to be r/s to another day next week if possible. Pt aware of plan. Once echo is r/s then BM will be scheduled . She will come for labs on 11/28 @ 8am prior to going for  biopsy.Marland Kitchen

## 2022-03-22 NOTE — Assessment & Plan Note (Signed)
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 Lanier Ensign, he recommends salvage chemotherapy with ICE regimen.  In addition, also recommend patient to have intrathecal triple therapy with MTX 63m, Ara C 427mand hydrocortisone 100 mg I will obtain MRI brain with and without contrast, obtain bone marrow biopsy for evaluation marrow involvement. I called patient and updated Dr. GaElsie Lincolnecommendation and the plan of additional workup.  Patient agrees with proceeding with chemotherapy.  Patient's Mediport has been removed. ICE regimen contains irritants, shared decision was made to utilize appropriately, if accessing the issue, will place Mediport.

## 2022-03-22 NOTE — Progress Notes (Signed)
ON PATHWAY REGIMEN - Lymphoma and CLL  No Change  Continue With Treatment as Ordered.  Original Decision Date/Time: 02/17/2020 22:18     A cycle is every 21 days:     Cyclophosphamide      Doxorubicin      Prednisone      Brentuximab vedotin   **Always confirm dose/schedule in your pharmacy ordering system**  Patient Characteristics: T-Cell Lymphoma, First Line, AITL (Angioimmunoblastic T-Cell Lymphoma) or ALCL (Anaplastic Large Cell Lymphoma) or Peripheral T-Cell, NOS, CD30 Positive Disease Type: Not Applicable Disease Type: T-Cell Lymphoma Disease Type: Not Applicable Line of Therapy: First Line T-Cell Lymphoma Subtype: ALCL (Anaplastic Large Cell Lymphoma) CD30 Status: CD30 Positive Intent of Therapy: Curative Intent, Discussed with Patient

## 2022-03-22 NOTE — Telephone Encounter (Signed)
Request for Intrathecal injection (methotrexate, cytarabine, hydrocortisone) has been faxed to IR. To be done on 12/5

## 2022-03-22 NOTE — Telephone Encounter (Signed)
All appointments have been adjusted and mychart message has been sent to pt with updated details.

## 2022-03-22 NOTE — Progress Notes (Signed)
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 Lanier Ensign, he recommends salvage chemotherapy with ICE regimen.  In addition, also recommend patient to have intrathecal triple therapy with MTX 27m, Ara C 435mand hydrocortisone 100 mg I will obtain MRI brain with and without contrast, obtain bone marrow biopsy for evaluation marrow involvement. I called patient and updated Dr. GaElsie Lincolnecommendation and the plan of additional workup.  Patient agrees with proceeding with chemotherapy.  Patient's Mediport has been removed. ICE regimen contains irritants, shared decision was made to utilize appropriately, if accessing the issue, will place Mediport.   Goals of care, counseling/discussion Discussed with patient.  Orders Placed This Encounter  Procedures   Consent Attestation for Oncology Treatment    Order Specific Question:   The patient is informed of risks, benefits, side-effects of the prescribed oncology treatment. Potential short term and long term side effects and response rates discussed. After a long discussion, the patient made informed decision to proceed.    Answer:   Yes   CBC with Differential/Platelet    Standing Status:   Future    Standing Expiration Date:   03/23/2023   Comprehensive metabolic panel    Standing Status:   Future    Standing Expiration Date:   03/23/2023   Lactate dehydrogenase    Standing Status:   Future    Standing Expiration Date:   03/23/2023   Hepatitis B surface antigen     Standing Status:   Future    Standing Expiration Date:   03/23/2023   Hepatitis B core antibody, IgM    Standing Status:   Future    Standing Expiration Date:   03/23/2023   Follow-up Plan to start chemotherapy during week of 04/04/22. All questions were answered. The patient knows to call the clinic with any problems, questions or concerns.  ZhEarlie ServerMD, PhD [facility] 03/21/2022     CHIEF COMPLAINTS/REASON FOR VISIT:  Follow up for anaplastic large cell lymphoma, ALK postive  HISTORY OF PRESENTING ILLNESS:   Angel Tate a  [age].o.  female presents for follow-up of ALK positive anaplastic large cell lymphoma Oncology history summary is listed below. 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  Initially the mass did not bother her.  Patient developed a rash in the right axillary/upper outer quadrant of the breast and had additional work-up.  Patient was treated with a 10-day course of doxycycline for possible infection.  Did not improve.  #02/04/2020, targeted ultrasound showed right axillary mass measuring 6.2 x 4 by 4 cm.  There are 2 adjacent smaller axillary mass measuring 2.0 x 1.2 x 1.4 cm and 0.8 x 0.7 x 0.9 cm.  Mammogram showed dense fibroglandular.  No suspicious mass or malignant type microcalcifications or distortion detected in either breast.  # Patient underwent ultrasound-guided core biopsy of the right axillary mass. 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  Ki-67 80-90%, Flow cytometry  has insufficient cells for analysis.    02/17/2020 Bone Marrow Biopsy   Bone marrow biopsy is negative   02/18/2020 Echocardiogram   Echocardiogram showed normal LVEF   03/02/2020 Imaging   PET scan showed right axillary pathological adenopathy, largest lymph node 3.5 cm with maximum SUV 42.8.  An adjacent smaller peripheral right axillary lymph node measuring 1.3 cm with SUV of 23.3. Anterior mediastinal density favors benign thymic tissue maximum SUV 3.0 which is due [place] 4, far below the level of last has lymphadenopathy.   03/03/2020 - 05/30/2020 Chemotherapy   03/03/2020 - 05/30/2020 4 cycles of BV AVD Q21 days.   Patient does not desire to preserve fertility.  She has IUD for contraceptive measures.   05/20/2020 Imaging   PET showed complete response   06/02/2020 - 06/25/2020 Chemotherapy   2 cycles of BV AVD Q21 days   09/01/2020 Imaging   PET scan  1. No signs to suggest residual or recurrent FDG avid tumor.2. Resolution of previously noted generalized marrow hypermetabolism.3. FDG avid brown fat noted within the neck and chest compatible with benign physiologic activity.   09/13/2020 Procedure   Her Mediport was removed by Dr. Lucky Cowboy.   03/01/2021 Imaging   Surveillance PET showed No evidence of active lymphoma.   10/13/2021 Imaging   Surveillance PET showed  1. No evidence of lymphoma recurrence. No lymphadenopathy. Normal spleen and bone marrow.2. Particular attention directed to the RIGHT axilla.3. Benign cyst of the LEFT ovary   03/03/2022 Mammogram   Patient palpated left breast mass with rapid growth.   Bilateral diagnostic mammogram/ left axilla  showed 1. Suspicious 2.4 cm mass along the 11 o'clock axis of the left breast. Recommend ultrasound-guided biopsy. 2. No suspicious left axillary lymphadenopathy. 3. No mammographic evidence of malignancy on the right.   03/06/2022 Relapse/Recurrence   Left breast mass biopsy showed ALK positive anaplastic large cell lymphoma.  Ki67 100% Flowcytometry is overall non contributory.    03/15/2022 Imaging   PET restaging showed  1. New hypermetabolic round mass in the medial LEFT breast consistent with lymphoma recurrence. 2. New lesion in the mid body of the pancreas consistent with lymphoma recurrence/metastasis. 3. Very small hypermetabolic RIGHT supraclavicular node is concerning for lymphoma. 4. Two pulmonary nodules in the RIGHT lung are new from prior. Indeterminate nodules.    04/05/2022 -  Chemotherapy   Patient is on Treatment Plan : NON-HODGKINS LYMPHOMA ICE q21d      Patient presents for follow-up.  She palpated left breast mass, rapid growth.  Status post mammogram and a biopsy.  Unfortunately biopsy confirmed recurrence.  Patient presents for discussion of treatment plan.  Accompanied by husband.  She is otherwise feeling well.  She has lost 6 pounds since her appointment in June 2023.   Review of Systems  Constitutional:  Negative for appetite change, chills, fatigue and fever.  HENT:   Negative for hearing loss and voice change.   Eyes:  Negative for eye problems.  Respiratory:  Negative for chest tightness, cough and shortness of breath.   Cardiovascular:  Negative for chest pain.  Gastrointestinal:  Negative for abdominal distention, abdominal pain and blood in stool.  Endocrine: Negative for hot flashes.  Genitourinary:  Negative  for difficulty urinating and frequency.   Musculoskeletal:  Negative for arthralgias.  Skin:  Negative for itching and rash.  Neurological:  Negative for extremity weakness.  Hematological:  Negative for adenopathy.  Psychiatric/Behavioral:  Negative for confusion.     MEDICAL HISTORY:  Past Medical History:  Diagnosis Date   Anxiety    Depression    Medical history non-contributory     SURGICAL HISTORY: Past Surgical History:  Procedure Laterality Date   BREAST BIOPSY Left 03/06/2022   [place] LT BREAST BX W LOC DEV 1ST LESION IMG BX SPEC US GUIDE 03/06/2022  [facility] MAMMOGRAPHY   DILATION AND CURETTAGE OF UTERUS     DILATION AND EVACUATION N/A 02/17/2017   Procedure: DILATATION AND EVACUATION;  Surgeon: Janyth Pupa, DO;  Location: [facility];  Service: Gynecology;  Laterality: N/A;   PORTA CATH INSERTION N/A 02/23/2020   Procedure: PORTA CATH INSERTION;  Surgeon: Algernon Huxley, MD;  Location: [place] [facility];  Service: Cardiovascular;  Laterality: N/A;   PORTA CATH REMOVAL N/A 09/13/2020   Procedure: PORTA CATH REMOVAL;  Surgeon: Algernon Huxley, MD;  Location: [place] [facility];  Service: Cardiovascular;  Laterality: N/A;    SOCIAL HISTORY: Social History   Socioeconomic History   Marital status: Married    Spouse name: Cody   Number of children: 1   Years of education: Not on file   Highest education level: Not on file  Occupational History   Occupation: social media- marketing   Tobacco Use   Smoking status: Never   Smokeless tobacco: Never  Vaping Use   Vaping Use: Never used  Substance and Sexual Activity   Alcohol use: Yes    Comment: socially   Drug use: No   Sexual activity: Yes    Birth control/protection: None  Other Topics Concern   Not on file  Social History Narrative   Not on file   Social Determinants of Health   Financial Resource Strain: Low Risk  (01/16/2019)   Overall Financial Resource Strain (CARDIA)    Difficulty of Paying Living Expenses: Not hard at all  Food Insecurity: No Food Insecurity (01/16/2019)   Hunger Vital Sign    Worried About Running Out of Food in the Last Year: Never true    Ran Out of Food in the Last Year: Never true  Transportation Needs: No Transportation Needs (01/16/2019)   PRAPARE - Hydrologist (Medical): No    Lack of Transportation (Non-Medical): No  Physical Activity: Unknown (01/19/2019)   Exercise Vital Sign    Days of Exercise per Week: Patient refused    Minutes of Exercise per Session: Patient refused  Stress: No Stress Concern Present  (01/19/2019)   [place]    Feeling of Stress : Only a little  Social Connections: Unknown (01/19/2019)   Social Connection and Isolation Panel [NHANES]    Frequency of Communication with Friends and Family: Patient refused    Frequency of Social Gatherings with Friends and Family: Patient refused    Attends Religious Services: Patient refused    Active Member of Clubs or Organizations: Patient refused    Attends Archivist Meetings: Patient refused    Marital Status: Patient refused  Intimate Partner Violence: Not At Risk (01/19/2019)   Humiliation, Afraid, Rape, and Kick questionnaire    Fear of Current or Ex-Partner: No    Emotionally Abused: No    Physically Abused: No  Sexually Abused: No    FAMILY HISTORY: Family History  Problem Relation Age of Onset   Asthma Maternal Aunt    Hypertension Maternal Grandmother    Ovarian cancer Paternal Grandmother        dx 71s-80s    ALLERGIES:  has No Known Allergies.  MEDICATIONS:  Current Outpatient Medications  Medication Sig Dispense Refill   ALPRAZolam (XANAX) 0.5 MG tablet Take 0.5 mg by mouth at bedtime as needed for anxiety.     escitalopram (LEXAPRO) 10 MG tablet Take 10 mg by mouth daily.     Levonorgestrel (SKYLA) 13.5 MG IUD      allopurinol (ZYLOPRIM) 300 MG tablet Take 1 tablet (300 mg total) by mouth daily. 30 tablet 2   dexamethasone (DECADRON) 4 MG tablet Take 2 tablets (8 mg total) by mouth daily. Start the day after chemotherapy for 2 days. Take with food. 30 tablet 1   escitalopram (LEXAPRO) 5 MG tablet Take 5 mg by mouth daily. (Patient not taking: Reported on 03/21/2022)     gabapentin (NEURONTIN) 100 MG capsule Take 1 capsule (100 mg total) by mouth 2 (two) times daily. (Patient not taking: Reported on 06/02/2020) 30 capsule 0   ibuprofen (ADVIL,MOTRIN) 200 MG tablet Take 3 tablets (600 mg total) by mouth every 6 (six) hours as needed (for  pain.). (Patient not taking: Reported on 03/08/2021) 30 tablet 0   loperamide (IMODIUM) 2 MG capsule Take 1 capsule (2 mg total) by mouth See admin instructions. Initial: 4 mg, followed by 2 mg every 2 to 4 hours or after each loose stool; Max 16 mg /24 hours (Patient not taking: Reported on 09/13/2020) 60 capsule 1   LORazepam (ATIVAN) 0.5 MG tablet Take 1 tablet (0.5 mg total) by mouth every 8 (eight) hours as needed for anxiety. (Patient not taking: Reported on 09/13/2020) 60 tablet 0   ondansetron (ZOFRAN) 8 MG tablet Take 1 tablet (8 mg total) by mouth every 8 (eight) hours as needed for nausea or vomiting. Start 2 days after chemotherapy. 30 tablet 1   oxyCODONE (OXY IR/ROXICODONE) 5 MG immediate release tablet Take 1 tablet (5 mg total) by mouth every 6 (six) hours as needed for moderate pain or severe pain. (Patient not taking: Reported on 09/13/2020) 30 tablet 0   prochlorperazine (COMPAZINE) 10 MG tablet Take 1 tablet (10 mg total) by mouth every 6 (six) hours as needed for nausea or vomiting. 30 tablet 1   promethazine (PHENERGAN) 25 MG tablet Take 1 tablet (25 mg total) by mouth every 6 (six) hours as needed for nausea or vomiting. (Patient not taking: Reported on 09/03/2020) 120 tablet 2   venlafaxine (EFFEXOR) 37.5 MG tablet TAKE 1 TABLET BY MOUTH EVERY DAY (Patient not taking: Reported on 09/13/2020) 90 tablet 1   No current facility-administered medications for this visit.     PHYSICAL EXAMINATION: ECOG PERFORMANCE STATUS: 1 - Symptomatic but completely ambulatory Vitals:   03/21/22 1419  BP: (!) 141/54  Pulse: 80  Temp: (!) 100.4 F (38 C)  SpO2: 100%   Filed Weights   03/21/22 1419  Weight: 162 lb 9.6 oz (73.8 kg)    Physical Exam Constitutional:      General: She is not in acute distress. HENT:     Head: Normocephalic and atraumatic.  Eyes:     General: No scleral icterus. Cardiovascular:     Rate and Rhythm: Normal rate and regular rhythm.  Pulmonary:     Effort:  Pulmonary effort is  normal. No respiratory distress.     Breath sounds: No wheezing.  Abdominal:     General: Bowel sounds are normal. There is no distension.     Palpations: Abdomen is soft.  Musculoskeletal:        General: No deformity. Normal range of motion.     Cervical back: Normal range of motion and neck supple.  Skin:    General: Skin is warm and dry.     Findings: No erythema or rash.  Neurological:     Mental Status: She is alert and oriented to person, place, and time. Mental status is at baseline.     Cranial Nerves: No cranial nerve deficit.     Coordination: Coordination normal.  Psychiatric:        Mood and Affect: Mood normal.     LABORATORY DATA:  I have reviewed the data as listed Lab Results  Component Value Date   WBC 6.0 10/17/2021   HGB 13.0 10/17/2021   HCT 39.5 10/17/2021   MCV 94.0 10/17/2021   PLT 233 10/17/2021   Recent Labs    10/17/21 1353  NA 139  K 4.3  CL 104  CO2 28  GLUCOSE 89  BUN 12  CREATININE 0.87  CALCIUM 9.2  GFRNONAA >60  PROT 7.2  ALBUMIN 4.0  AST 21  ALT 14  ALKPHOS 47  BILITOT 0.4    Iron/TIBC/Ferritin/ %Sat No results found for: "IRON", "TIBC", "FERRITIN", "IRONPCTSAT"    RADIOGRAPHIC STUDIES: I have personally reviewed the radiological images as listed and agreed with the findings in the report. NM PET Image Restage (PS) Skull Base to Thigh (F-18 FDG)  Result Date: 03/15/2022 CLINICAL DATA:  Subsequent treatment strategy for anaplastic alk positive large-cell lymphoma. EXAM: NUCLEAR MEDICINE PET SKULL BASE TO THIGH TECHNIQUE: 9.2 mCi F-18 FDG was injected intravenously. Full-ring PET imaging was performed from the skull base to thigh after the radiotracer. CT data was obtained and used for attenuation correction and anatomic localization. Fasting blood glucose: 85 mg/dl COMPARISON:  PET-CT 10/12/2021 FINDINGS: Mediastinal blood pool activity: SUV max 1.6 Liver activity: SUV max 2.0 NECK: Hypermetabolic focus in  the RIGHT supraclavicular neck appears to localize to a small node measuring 5 mm image 52/2. Activity is intense for size with SUV max equal 5.7 (image 53 )PET data set Incidental CT findings: None. CHEST: Within the medial LEFT breast there is a new round mass measuring 2.9 cm in diameter and with intense metabolic activity (SUV max equal 34.3). Two small RIGHT lung nodules are new from prior. 8 mm nodule on image 91/2 and 5 mm nodule on image 87/2. These nodules appear associated with the horizontal fissure. No faint radiotracer activity associated with the larger nodule Incidental CT findings: None. ABDOMEN/PELVIS: New intense focal activity localizes to the body of the pancreas with SUV max equal 12.2 (image 129. Lesion in the subtly evident on the noncontrast CT measuring approximately 18 mm. Incidental CT findings: None. SKELETON: No focal hypermetabolic activity to suggest skeletal metastasis. Incidental CT findings: None. IMPRESSION: 1. New hypermetabolic round mass in the medial LEFT breast consistent with lymphoma recurrence. 2. New lesion in the mid body of the pancreas consistent with lymphoma recurrence/metastasis. 3. Very small hypermetabolic RIGHT supraclavicular node is concerning for lymphoma. 4. Two pulmonary nodules in the RIGHT lung are new from prior. Indeterminate nodules. Recommend close attention on follow-up. These results will be called to the ordering clinician or representative by the Radiologist Assistant, and communication documented in the  PACS or [employer]. Electronically Signed   By: Suzy Bouchard M.D.   On: 03/15/2022 12:33   [place] LT BREAST BX W LOC DEV 1ST LESION IMG BX SPEC US GUIDE  Addendum Date: 03/10/2022   ADDENDUM REPORT: 03/10/2022 10:34 ADDENDUM: Pathology revealed ALK POSITIVE ANAPLASTIC LARGE CELL LYMPHOMA of the LEFT breast mass, 11 o'clock, (hydromark clip). FLOW CYTOMETRY IS OVERALL NONCONTRIBUTORY. This was found to be concordant by Dr. Kristopher Oppenheim.  Pathology results were discussed with the patient by telephone. The patient reported doing well after the biopsy with tenderness at the site. Post biopsy instructions and care were reviewed and questions were answered. The patient was encouraged to call [facility] for any additional concerns. My direct phone number was provided. The patient has a diagnosis of LYMPHOMA and should follow her outlined treatment plan. Dr. Earlie Server at [facility] was notified of biopsy results via secure EPIC message on March 09, 2022. Pathology results reported by Terie Purser, RN on 03/09/2022. Electronically Signed   By: Kristopher Oppenheim M.D.   On: 03/10/2022 10:34   Result Date: 03/10/2022 CLINICAL DATA:  31 year old female with an indeterminate left breast mass. EXAM: ULTRASOUND GUIDED LEFT BREAST CORE NEEDLE BIOPSY COMPARISON:  Previous exam(s). PROCEDURE: I met with the patient and we discussed the procedure of ultrasound-guided biopsy, including benefits and alternatives. We discussed the high likelihood of a successful procedure. We discussed the risks of the procedure, including infection, bleeding, tissue injury, clip migration, and inadequate sampling. Informed written consent was given. The usual time-out protocol was performed immediately prior to the procedure. Lesion quadrant: Upper inner quadrant Using sterile technique and 1% Lidocaine as local anesthetic, under direct ultrasound visualization, a 14 gauge spring-loaded device was used to perform biopsy of a mass at the 11 o'clock position using a inferior approach. At the conclusion of the procedure a HydroMARK tissue marker clip was deployed into the biopsy cavity. Follow up 2 view mammogram was performed and dictated separately. IMPRESSION: Ultrasound guided biopsy of the left breast. No apparent complications. Electronically Signed: By: Kristopher Oppenheim M.D. On: 03/06/2022 09:24  MM CLIP PLACEMENT LEFT  Result Date:  03/06/2022 CLINICAL DATA:  Status post left breast ultrasound biopsy. EXAM: 3D DIAGNOSTIC LEFT MAMMOGRAM POST ULTRASOUND BIOPSY COMPARISON:  Previous exam(s). FINDINGS: 3D Mammographic images were obtained following ultrasound guided biopsy of the upper left breast. The biopsy marking clip is in expected position at the site of biopsy. IMPRESSION: Appropriate positioning of the HydroMARK biopsy marking clip at the site of biopsy in the upper inner left breast. Final Assessment: Post Procedure Mammograms for Marker Placement Electronically Signed   By: Kristopher Oppenheim M.D.   On: 03/06/2022 09:38  MM DIAG BREAST TOMO BILATERAL  Result Date: 03/03/2022 CLINICAL DATA:  31 year old female currently in remission and under surveillance for non-Hodgkin's lymphoma presents with a palpable left breast lump for 1 week with associated tenderness. EXAM: DIGITAL DIAGNOSTIC BILATERAL MAMMOGRAM WITH TOMOSYNTHESIS; ULTRASOUND LEFT BREAST LIMITED TECHNIQUE: Bilateral digital diagnostic mammography and breast tomosynthesis was performed.; Targeted ultrasound examination of the left breast was performed. COMPARISON:  Previous exam(s). ACR Breast Density Category c: The breast tissue is heterogeneously dense, which may obscure small masses. FINDINGS: A radiopaque BB was placed at the site of the patient's palpable lump in the upper inner left breast. A round, circumscribed hyperdense mass is partially visualized deep to the radiopaque BB. There are no focal masses, distortions or calcifications within the remainder of  either breast. Targeted ultrasound is performed, showing a round, irregular hypoechoic mass with associated peripheral vascularity at the 11 o'clock position 4 cm from the nipple on the left. It measures 2.4 x 1.9 x 1.8 cm. This correlates with the patient's palpable lump. Evaluation of the left axilla demonstrates no suspicious lymphadenopathy. IMPRESSION: 1. Suspicious 2.4 cm mass along the 11 o'clock axis of the left  breast. Recommend ultrasound-guided biopsy. 2. No suspicious left axillary lymphadenopathy. 3. No mammographic evidence of malignancy on the right. RECOMMENDATION: Ultrasound-guided biopsy of the left breast. I have discussed the findings and recommendations with the patient. If applicable, a reminder letter will be sent to the patient regarding the next appointment. BI-RADS CATEGORY  4: Suspicious. Electronically Signed   By: Kristopher Oppenheim M.D.   On: 03/03/2022 10:13  US BREAST LTD UNI LEFT INC AXILLA  Result Date: 03/03/2022 CLINICAL DATA:  31 year old female currently in remission and under surveillance for non-Hodgkin's lymphoma presents with a palpable left breast lump for 1 week with associated tenderness. EXAM: DIGITAL DIAGNOSTIC BILATERAL MAMMOGRAM WITH TOMOSYNTHESIS; ULTRASOUND LEFT BREAST LIMITED TECHNIQUE: Bilateral digital diagnostic mammography and breast tomosynthesis was performed.; Targeted ultrasound examination of the left breast was performed. COMPARISON:  Previous exam(s). ACR Breast Density Category c: The breast tissue is heterogeneously dense, which may obscure small masses. FINDINGS: A radiopaque BB was placed at the site of the patient's palpable lump in the upper inner left breast. A round, circumscribed hyperdense mass is partially visualized deep to the radiopaque BB. There are no focal masses, distortions or calcifications within the remainder of either breast. Targeted ultrasound is performed, showing a round, irregular hypoechoic mass with associated peripheral vascularity at the 11 o'clock position 4 cm from the nipple on the left. It measures 2.4 x 1.9 x 1.8 cm. This correlates with the patient's palpable lump. Evaluation of the left axilla demonstrates no suspicious lymphadenopathy. IMPRESSION: 1. Suspicious 2.4 cm mass along the 11 o'clock axis of the left breast. Recommend ultrasound-guided biopsy. 2. No suspicious left axillary lymphadenopathy. 3. No mammographic evidence of  malignancy on the right. RECOMMENDATION: Ultrasound-guided biopsy of the left breast. I have discussed the findings and recommendations with the patient. If applicable, a reminder letter will be sent to the patient regarding the next appointment. BI-RADS CATEGORY  4: Suspicious. Electronically Signed   By: Kristopher Oppenheim M.D.   On: 03/03/2022 10:13

## 2022-03-22 NOTE — Assessment & Plan Note (Signed)
Discussed with patient

## 2022-03-22 NOTE — Telephone Encounter (Signed)
Request for BM biopsy faxed to IR

## 2022-03-23 ENCOUNTER — Other Ambulatory Visit: Payer: Self-pay

## 2022-03-24 ENCOUNTER — Ambulatory Visit
Admission: RE | Admit: 2022-03-24 | Discharge: 2022-03-24 | Disposition: A | Discharge status: Home / Self Care | Payer: BC Managed Care – PPO | Source: Ambulatory Visit | Attending: Oncology | Admitting: Oncology

## 2022-03-24 DIAGNOSIS — C8464 Anaplastic large cell lymphoma, ALK-positive, lymph nodes of axilla and upper limb: Secondary | ICD-10-CM | POA: Insufficient documentation

## 2022-03-24 MED ORDER — GADOBUTROL 1 MMOL/ML IV SOLN
7.5000 mL | Freq: Once | INTRAVENOUS | Status: AC | PRN
  Administered 2022-03-24: 7.5 mL via INTRAVENOUS

## 2022-03-27 ENCOUNTER — Other Ambulatory Visit: Payer: Self-pay | Admitting: Radiology

## 2022-03-27 ENCOUNTER — Encounter: Payer: Self-pay | Admitting: Oncology

## 2022-03-27 DIAGNOSIS — C8464 Anaplastic large cell lymphoma, ALK-positive, lymph nodes of axilla and upper limb: Secondary | ICD-10-CM

## 2022-03-27 NOTE — Telephone Encounter (Signed)
Pt has been scheduled and will go to IR for Injection first and then come to cancer center for lab/MD. Pt informed via mychart.

## 2022-03-27 NOTE — Progress Notes (Signed)
Patient for Bone Marrow Biopsy on Tues 03/28/2022, I called and spoke with the patient on the  phone and gave pre-procedure instructions. Pt was made aware to be here at 8:30a at the new entrance, NPO after MN prior to procedure as well as driver post procedure/recovery/discharge. Pt stated understanding. Called 03/27/2022

## 2022-03-28 ENCOUNTER — Encounter: Payer: Self-pay | Admitting: Oncology

## 2022-03-28 ENCOUNTER — Ambulatory Visit
Admission: RE | Admit: 2022-03-28 | Discharge: 2022-03-28 | Disposition: A | Discharge status: Home / Self Care | Payer: BC Managed Care – PPO | Source: Ambulatory Visit | Attending: Oncology | Admitting: Oncology

## 2022-03-28 ENCOUNTER — Other Ambulatory Visit: Payer: Self-pay | Admitting: Oncology

## 2022-03-28 ENCOUNTER — Ambulatory Visit: Payer: BC Managed Care – PPO

## 2022-03-28 ENCOUNTER — Other Ambulatory Visit: Payer: BC Managed Care – PPO

## 2022-03-28 ENCOUNTER — Inpatient Hospital Stay: Payer: BC Managed Care – PPO

## 2022-03-28 ENCOUNTER — Other Ambulatory Visit: Payer: Self-pay

## 2022-03-28 DIAGNOSIS — C8460 Anaplastic large cell lymphoma, ALK-positive, unspecified site: Secondary | ICD-10-CM | POA: Diagnosis present

## 2022-03-28 DIAGNOSIS — Z1379 Encounter for other screening for genetic and chromosomal anomalies: Secondary | ICD-10-CM | POA: Insufficient documentation

## 2022-03-28 DIAGNOSIS — C8464 Anaplastic large cell lymphoma, ALK-positive, lymph nodes of axilla and upper limb: Secondary | ICD-10-CM

## 2022-03-28 LAB — CBC WITH DIFFERENTIAL/PLATELET
Abs Immature Granulocytes: 0.03 10*3/uL (ref 0.00–0.07)
Abs Immature Granulocytes: 0.03 10*3/uL (ref 0.00–0.07)
Basophils Absolute: 0 10*3/uL (ref 0.0–0.1)
Basophils Absolute: 0 10*3/uL (ref 0.0–0.1)
Basophils Relative: 0 %
Basophils Relative: 0 %
Eosinophils Absolute: 0.1 10*3/uL (ref 0.0–0.5)
Eosinophils Absolute: 0.1 10*3/uL (ref 0.0–0.5)
Eosinophils Relative: 1 %
Eosinophils Relative: 1 %
HCT: 37.1 % (ref 36.0–46.0)
HCT: 35.8 % — ABNORMAL LOW (ref 36.0–46.0)
Hemoglobin: 12.1 g/dL (ref 12.0–15.0)
Hemoglobin: 11.7 g/dL — ABNORMAL LOW (ref 12.0–15.0)
Immature Granulocytes: 0 %
Immature Granulocytes: 0 %
Lymphocytes Relative: 15 %
Lymphocytes Relative: 15 %
Lymphs Abs: 1.3 10*3/uL (ref 0.7–4.0)
Lymphs Abs: 1.3 10*3/uL (ref 0.7–4.0)
MCH: 30.3 pg (ref 26.0–34.0)
MCH: 29.4 pg (ref 26.0–34.0)
MCHC: 32.6 g/dL (ref 30.0–36.0)
MCHC: 32.7 g/dL (ref 30.0–36.0)
MCV: 93 fL (ref 80.0–100.0)
MCV: 89.9 fL (ref 80.0–100.0)
Monocytes Absolute: 0.7 10*3/uL (ref 0.1–1.0)
Monocytes Absolute: 0.6 10*3/uL (ref 0.1–1.0)
Monocytes Relative: 8 %
Monocytes Relative: 7 %
Neutro Abs: 6.5 10*3/uL (ref 1.7–7.7)
Neutro Abs: 6.2 10*3/uL (ref 1.7–7.7)
Neutrophils Relative %: 76 %
Neutrophils Relative %: 77 %
Platelets: 283 10*3/uL (ref 150–400)
Platelets: 275 10*3/uL (ref 150–400)
RBC: 3.99 MIL/uL (ref 3.87–5.11)
RBC: 3.98 MIL/uL (ref 3.87–5.11)
RDW: 12.5 % (ref 11.5–15.5)
RDW: 12.6 % (ref 11.5–15.5)
WBC: 8.6 10*3/uL (ref 4.0–10.5)
WBC: 8.2 10*3/uL (ref 4.0–10.5)
nRBC: 0 % (ref 0.0–0.2)
nRBC: 0 % (ref 0.0–0.2)

## 2022-03-28 LAB — COMPREHENSIVE METABOLIC PANEL
ALT: 9 U/L (ref 0–44)
AST: 15 U/L (ref 15–41)
Albumin: 3.8 g/dL (ref 3.5–5.0)
Alkaline Phosphatase: 60 U/L (ref 38–126)
Anion gap: 6 (ref 5–15)
BUN: 9 mg/dL (ref 6–20)
CO2: 26 mmol/L (ref 22–32)
Calcium: 9.1 mg/dL (ref 8.9–10.3)
Chloride: 105 mmol/L (ref 98–111)
Creatinine, Ser: 0.66 mg/dL (ref 0.44–1.00)
GFR, Estimated: >60 mL/min (ref >60)
Glucose, Bld: 105 mg/dL — ABNORMAL HIGH (ref 70–99)
Potassium: 4.5 mmol/L (ref 3.5–5.1)
Sodium: 137 mmol/L (ref 135–145)
Total Bilirubin: 0.3 mg/dL (ref 0.3–1.2)
Total Protein: 7.5 g/dL (ref 6.5–8.1)

## 2022-03-28 LAB — HEPATITIS B CORE ANTIBODY, IGM: Hep B C IgM: NONREACTIVE

## 2022-03-28 LAB — HEPATITIS B SURFACE ANTIGEN: Hepatitis B Surface Ag: NONREACTIVE

## 2022-03-28 LAB — LACTATE DEHYDROGENASE: LDH: 110 U/L (ref 98–192)

## 2022-03-28 MED ORDER — SODIUM CHLORIDE 0.9 % IV SOLN: INTRAVENOUS | Status: DC

## 2022-03-28 MED ORDER — OXYCODONE HCL 5 MG PO TABS: 5.0000 mg | ORAL_TABLET | Freq: Four times a day (QID) | ORAL | 0 refills | Status: DC | PRN

## 2022-03-28 MED ORDER — FENTANYL CITRATE (PF) 100 MCG/2ML IJ SOLN
INTRAMUSCULAR | Status: AC
  Filled 2022-03-28: qty 2

## 2022-03-28 MED ORDER — HEPARIN SOD (PORK) LOCK FLUSH 100 UNIT/ML IV SOLN
INTRAVENOUS | Status: AC
  Filled 2022-03-28: qty 5

## 2022-03-28 MED ORDER — FENTANYL CITRATE (PF) 100 MCG/2ML IJ SOLN
INTRAMUSCULAR | Status: AC | PRN
  Administered 2022-03-28 (×2): 50 ug via INTRAVENOUS

## 2022-03-28 MED ORDER — MIDAZOLAM HCL 2 MG/2ML IJ SOLN
INTRAMUSCULAR | Status: AC | PRN
  Administered 2022-03-28 (×2): 1 mg via INTRAVENOUS

## 2022-03-28 MED ORDER — MIDAZOLAM HCL 2 MG/2ML IJ SOLN
INTRAMUSCULAR | Status: AC
  Filled 2022-03-28: qty 4

## 2022-03-28 MED ORDER — LIDOCAINE HCL (PF) 1 % IJ SOLN: 10.0000 mL | Freq: Once | INTRAMUSCULAR | Status: DC

## 2022-03-28 NOTE — Telephone Encounter (Signed)
Please advise 

## 2022-03-28 NOTE — H&P (Signed)
Chief Complaint: Patient was seen in consultation today for image-guided bone marrow biopsy  Referring Physician(s): Yu,Zhou  Supervising Physician: Aletta Edouard  Patient Status: [facility] - Out-pt  History of Present Illness: Angel Tate is a 31 y.o. 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 The patient was treated successfully in 2021-2022, but recently noticed a left breast mass with rapid growth, with biopsy on 03/06/22 showing anaplastic ALK positive large cell lymphoma. PET scan on 03/15/22 revealed lesions in the breast, the pancreas, and two pulmonary nodules. The patient was referred to IR for bone marrow biopsy prior to beginning chemotherapy.  Past Medical History:  Diagnosis Date   Anxiety    Depression    Medical history non-contributory     Past Surgical History:  Procedure Laterality Date   BREAST BIOPSY Left 03/06/2022   [place] LT BREAST BX W LOC DEV 1ST LESION IMG BX SPEC US GUIDE 03/06/2022 [facility] MAMMOGRAPHY   DILATION AND CURETTAGE OF UTERUS     DILATION AND EVACUATION N/A 02/17/2017   Procedure: DILATATION AND EVACUATION;  Surgeon: Janyth Pupa, DO;  Location: [facility];  Service: Gynecology;  Laterality: N/A;   PORTA CATH INSERTION N/A 02/23/2020   Procedure: PORTA CATH INSERTION;  Surgeon: Algernon Huxley, MD;  Location: [place];  Service: Cardiovascular;  Laterality: N/A;   PORTA CATH REMOVAL N/A 09/13/2020   Procedure: PORTA CATH REMOVAL;  Surgeon: Algernon Huxley, MD;  Location: [facility];  Service: Cardiovascular;  Laterality: N/A;    Allergies: Patient has no known allergies.  Medications: Prior to Admission medications   Medication Sig Start Date End Date Taking? Authorizing Provider  acetaminophen (TYLENOL) 500 MG tablet Take 500 mg by mouth every 6 (six) hours as  needed.   Yes [provider]  allopurinol (ZYLOPRIM) 300 MG tablet Take 1 tablet (300 mg total) by mouth daily. Patient not taking: Reported on 03/28/2022 03/22/22   Earlie Server, MD  ALPRAZolam Duanne Moron) 0.5 MG tablet Take 0.5 mg by mouth at bedtime as needed for anxiety. Patient not taking: Reported on 03/28/2022    [provider]  dexamethasone (DECADRON) 4 MG tablet Take 2 tablets (8 mg total) by mouth daily. Start the day after chemotherapy for 2 days. Take with food. 03/22/22   Earlie Server, MD  escitalopram (LEXAPRO) 10 MG tablet Take 10 mg by mouth daily. Patient not taking: Reported on 03/28/2022    [provider]  escitalopram (LEXAPRO) 5 MG tablet Take 5 mg by mouth daily. Patient not taking: Reported on 03/21/2022    [provider]  gabapentin (NEURONTIN) 100 MG capsule Take 1 capsule (100 mg total) by mouth 2 (two) times daily. Patient not taking: Reported on 06/02/2020 03/03/20   Earlie Server, MD  ibuprofen (ADVIL,MOTRIN) 200 MG tablet Take 3 tablets (600 mg total) by mouth every 6 (six) hours as needed (for pain.). Patient not taking: Reported on 03/08/2021 02/17/17   Janyth Pupa, DO  Levonorgestrel (SKYLA) 13.5 MG IUD     [provider]  loperamide (IMODIUM) 2 MG capsule Take 1 capsule (2 mg total) by mouth See admin instructions. Initial: 4 mg, followed by 2 mg every 2 to 4 hours or after each loose stool; Max 16 mg /24 hours Patient not taking: Reported on 09/13/2020 03/11/20   Earlie Server, MD  LORazepam (ATIVAN) 0.5  MG tablet Take 1 tablet (0.5 mg total) by mouth every 8 (eight) hours as needed for anxiety. Patient not taking: Reported on 09/13/2020 05/10/20   Earlie Server, MD  ondansetron (ZOFRAN) 8 MG tablet Take 1 tablet (8 mg total) by mouth every 8 (eight) hours as needed for nausea or vomiting. Start 2 days after chemotherapy. 03/22/22   Earlie Server, MD  oxyCODONE (OXY IR/ROXICODONE) 5 MG immediate release tablet Take 1 tablet (5 mg total) by mouth  every 6 (six) hours as needed for moderate pain or severe pain. Patient not taking: Reported on 09/13/2020 03/03/20   Earlie Server, MD  prochlorperazine (COMPAZINE) 10 MG tablet Take 1 tablet (10 mg total) by mouth every 6 (six) hours as needed for nausea or vomiting. 03/22/22   Earlie Server, MD  promethazine (PHENERGAN) 25 MG tablet Take 1 tablet (25 mg total) by mouth every 6 (six) hours as needed for nausea or vomiting. Patient not taking: Reported on 09/03/2020 03/11/20   Earlie Server, MD  venlafaxine (EFFEXOR) 37.5 MG tablet TAKE 1 TABLET BY MOUTH EVERY DAY Patient not taking: Reported on 09/13/2020 07/19/20   Earlie Server, MD     Family History  Problem Relation Age of Onset   Asthma Maternal Aunt    Hypertension Maternal Grandmother    Ovarian cancer Paternal Grandmother        dx 67s-80s    Social History   Socioeconomic History   Marital status: Married    Spouse name: [name]   Number of children: 1   Years of education: Not on file   Highest education level: Not on file  Occupational History   Occupation: social media- marketing   Tobacco Use   Smoking status: Never   Smokeless tobacco: Never  Vaping Use   Vaping Use: Never used  Substance and Sexual Activity   Alcohol use: Yes    Comment: socially   Drug use: No   Sexual activity: Yes    Birth control/protection: None  Other Topics Concern   Not on file  Social History Narrative   Not on file   Social Determinants of Health   Financial Resource Strain: Low Risk  (01/16/2019)   Overall Financial Resource Strain (CARDIA)    Difficulty of Paying Living Expenses: Not hard at all  Food Insecurity: No Food Insecurity (01/16/2019)   Hunger Vital Sign    Worried About Running Out of Food in the Last Year: Never true    [place] in the Last Year: Never true  Transportation Needs: No Transportation Needs (01/16/2019)   PRAPARE - Hydrologist (Medical): No    Lack of Transportation (Non-Medical): No   Physical Activity: Unknown (01/19/2019)   Exercise Vital Sign    Days of Exercise per Week: Patient refused    Minutes of Exercise per Session: Patient refused  Stress: No Stress Concern Present (01/19/2019)   [place]    Feeling of Stress : Only a little  Social Connections: Unknown (01/19/2019)   Social Connection and Isolation Panel [NHANES]    Frequency of Communication with Friends and Family: Patient refused    Frequency of Social Gatherings with Friends and Family: Patient refused    Attends Religious Services: Patient refused    Active Member of Clubs or Organizations: Patient refused    Attends Archivist Meetings: Patient refused    Marital Status: Patient refused    Review of  Systems: A 12 point ROS discussed and pertinent positives are indicated in the HPI above.  All other systems are negative.  Review of Systems  Constitutional:  Negative for chills and fever.  Respiratory:  Negative for chest tightness and shortness of breath.   Cardiovascular:  Negative for chest pain and leg swelling.  Gastrointestinal:  Negative for abdominal pain, diarrhea, nausea and vomiting.  Neurological:  Negative for dizziness and headaches.  Psychiatric/Behavioral:  Negative for confusion.     Vital Signs: BP 98/63   Pulse 80   Temp 98.8 F (37.1 C) (Oral)   Resp 18   Ht _0  (1.575 m)   Wt 162 lb (73.5 kg)   LMP 03/24/2022 (Exact Date) Comment: NEGATIVE PREGNANCY TEST 03-14-22  SpO2 95%   BMI 29.63 kg/m     Physical Exam Vitals reviewed.  Constitutional:      General: She is not in acute distress. HENT:     Mouth/Throat:     Mouth: Mucous membranes are moist.  Cardiovascular:     Rate and Rhythm: Normal rate and regular rhythm.     Pulses: Normal pulses.     Heart sounds: Normal heart sounds.  Pulmonary:     Effort: Pulmonary effort is normal.     Breath sounds: Normal breath sounds.   Abdominal:     General: Abdomen is flat. Bowel sounds are normal.     Palpations: Abdomen is soft.     Tenderness: There is no abdominal tenderness.  Musculoskeletal:     Right lower leg: No edema.     Left lower leg: No edema.  Skin:    General: Skin is warm and dry.  Neurological:     Mental Status: She is alert and oriented to person, place, and time.     Imaging: MR Brain W Wo Contrast  Result Date: 03/26/2022 CLINICAL DATA:  ALK positive large-cell lymphoma EXAM: MRI HEAD WITHOUT AND WITH CONTRAST TECHNIQUE: Multiplanar, multiecho pulse sequences of the brain and surrounding structures were obtained without and with intravenous contrast. CONTRAST:  7.49m GADAVIST GADOBUTROL 1 MMOL/ML IV SOLN COMPARISON:  None Available. FINDINGS: Brain: No abnormal parenchymal or meningeal enhancement. No restricted diffusion to suggest acute or subacute infarct. No acute hemorrhage, mass, mass effect, or midline shift. No hemosiderin deposition to suggest remote hemorrhage. No hydrocephalus or extra-axial collection. No abnormal T2 signal to suggest demyelinating disease. Vascular: Normal arterial flow voids. Normal arterial and venous enhancement. Skull and upper cervical spine: Normal marrow signal. Sinuses/Orbits: Mucous retention cyst in the left maxillary sinus. The orbits are unremarkable. Other: The mastoids are well aerated. IMPRESSION: No acute intracranial process. No evidence of intracranial metastatic disease. Electronically Signed   By: AMerilyn BabaM.D.   On: 03/26/2022 00:59   NM PET Image Restage (PS) Skull Base to Thigh (F-18 FDG)  Result Date: 03/15/2022 CLINICAL DATA:  Subsequent treatment strategy for anaplastic alk positive large-cell lymphoma. EXAM: NUCLEAR MEDICINE PET SKULL BASE TO THIGH TECHNIQUE: 9.2 mCi F-18 FDG was injected intravenously. Full-ring PET imaging was performed from the skull base to thigh after the radiotracer. CT data was obtained and used for attenuation  correction and anatomic localization. Fasting blood glucose: 85 mg/dl COMPARISON:  PET-CT 10/12/2021 FINDINGS: Mediastinal blood pool activity: SUV max 1.6 Liver activity: SUV max 2.0 NECK: Hypermetabolic focus in the RIGHT supraclavicular neck appears to localize to a small node measuring 5 mm image 52/2. Activity is intense for size with SUV max equal 5.7 (image 53 )PET  data set Incidental CT findings: None. CHEST: Within the medial LEFT breast there is a new round mass measuring 2.9 cm in diameter and with intense metabolic activity (SUV max equal 34.3). Two small RIGHT lung nodules are new from prior. 8 mm nodule on image 91/2 and 5 mm nodule on image 87/2. These nodules appear associated with the horizontal fissure. No faint radiotracer activity associated with the larger nodule Incidental CT findings: None. ABDOMEN/PELVIS: New intense focal activity localizes to the body of the pancreas with SUV max equal 12.2 (image 129. Lesion in the subtly evident on the noncontrast CT measuring approximately 18 mm. Incidental CT findings: None. SKELETON: No focal hypermetabolic activity to suggest skeletal metastasis. Incidental CT findings: None. IMPRESSION: 1. New hypermetabolic round mass in the medial LEFT breast consistent with lymphoma recurrence. 2. New lesion in the mid body of the pancreas consistent with lymphoma recurrence/metastasis. 3. Very small hypermetabolic RIGHT supraclavicular node is concerning for lymphoma. 4. Two pulmonary nodules in the RIGHT lung are new from prior. Indeterminate nodules. Recommend close attention on follow-up. These results will be called to the ordering clinician or representative by the Radiologist Assistant, and communication documented in the PACS or [employer]. Electronically Signed   By: Suzy Bouchard M.D.   On: 03/15/2022 12:33   [place] LT BREAST BX W LOC DEV 1ST LESION IMG BX SPEC US GUIDE  Addendum Date: 03/10/2022   ADDENDUM REPORT: 03/10/2022 10:34 ADDENDUM:  Pathology revealed ALK POSITIVE ANAPLASTIC LARGE CELL LYMPHOMA of the LEFT breast mass, 11 o'clock, (hydromark clip). FLOW CYTOMETRY IS OVERALL NONCONTRIBUTORY. This was found to be concordant by Dr. Kristopher Oppenheim. Pathology results were discussed with the patient by telephone. The patient reported doing well after the biopsy with tenderness at the site. Post biopsy instructions and care were reviewed and questions were answered. The patient was encouraged to call [facility] for any additional concerns. My direct phone number was provided. The patient has a diagnosis of LYMPHOMA and should follow her outlined treatment plan. Dr. Earlie Server at [facility] was notified of biopsy results via secure EPIC message on March 09, 2022. Pathology results reported by Terie Purser, RN on 03/09/2022. Electronically Signed   By: Kristopher Oppenheim M.D.   On: 03/10/2022 10:34   Result Date: 03/10/2022 CLINICAL DATA:  31 year old female with an indeterminate left breast mass. EXAM: ULTRASOUND GUIDED LEFT BREAST CORE NEEDLE BIOPSY COMPARISON:  Previous exam(s). PROCEDURE: I met with the patient and we discussed the procedure of ultrasound-guided biopsy, including benefits and alternatives. We discussed the high likelihood of a successful procedure. We discussed the risks of the procedure, including infection, bleeding, tissue injury, clip migration, and inadequate sampling. Informed written consent was given. The usual time-out protocol was performed immediately prior to the procedure. Lesion quadrant: Upper inner quadrant Using sterile technique and 1% Lidocaine as local anesthetic, under direct ultrasound visualization, a 14 gauge spring-loaded device was used to perform biopsy of a mass at the 11 o'clock position using a inferior approach. At the conclusion of the procedure a HydroMARK tissue marker clip was deployed into the biopsy cavity. Follow up 2 view mammogram was performed and  dictated separately. IMPRESSION: Ultrasound guided biopsy of the left breast. No apparent complications. Electronically Signed: By: Kristopher Oppenheim M.D. On: 03/06/2022 09:24  MM CLIP PLACEMENT LEFT  Result Date: 03/06/2022 CLINICAL DATA:  Status post left breast ultrasound biopsy. EXAM: 3D DIAGNOSTIC LEFT MAMMOGRAM POST ULTRASOUND BIOPSY COMPARISON:  Previous  exam(s). FINDINGS: 3D Mammographic images were obtained following ultrasound guided biopsy of the upper left breast. The biopsy marking clip is in expected position at the site of biopsy. IMPRESSION: Appropriate positioning of the HydroMARK biopsy marking clip at the site of biopsy in the upper inner left breast. Final Assessment: Post Procedure Mammograms for Marker Placement Electronically Signed   By: Kristopher Oppenheim M.D.   On: 03/06/2022 09:38  MM DIAG BREAST TOMO BILATERAL  Result Date: 03/03/2022 CLINICAL DATA:  32 year old female currently in remission and under surveillance for non-Hodgkin's lymphoma presents with a palpable left breast lump for 1 week with associated tenderness. EXAM: DIGITAL DIAGNOSTIC BILATERAL MAMMOGRAM WITH TOMOSYNTHESIS; ULTRASOUND LEFT BREAST LIMITED TECHNIQUE: Bilateral digital diagnostic mammography and breast tomosynthesis was performed.; Targeted ultrasound examination of the left breast was performed. COMPARISON:  Previous exam(s). ACR Breast Density Category c: The breast tissue is heterogeneously dense, which may obscure small masses. FINDINGS: A radiopaque BB was placed at the site of the patient's palpable lump in the upper inner left breast. A round, circumscribed hyperdense mass is partially visualized deep to the radiopaque BB. There are no focal masses, distortions or calcifications within the remainder of either breast. Targeted ultrasound is performed, showing a round, irregular hypoechoic mass with associated peripheral vascularity at the 11 o'clock position 4 cm from the nipple on the left. It measures 2.4  x 1.9 x 1.8 cm. This correlates with the patient's palpable lump. Evaluation of the left axilla demonstrates no suspicious lymphadenopathy. IMPRESSION: 1. Suspicious 2.4 cm mass along the 11 o'clock axis of the left breast. Recommend ultrasound-guided biopsy. 2. No suspicious left axillary lymphadenopathy. 3. No mammographic evidence of malignancy on the right. RECOMMENDATION: Ultrasound-guided biopsy of the left breast. I have discussed the findings and recommendations with the patient. If applicable, a reminder letter will be sent to the patient regarding the next appointment. BI-RADS CATEGORY  4: Suspicious. Electronically Signed   By: Kristopher Oppenheim M.D.   On: 03/03/2022 10:13  US BREAST LTD UNI LEFT INC AXILLA  Result Date: 03/03/2022 CLINICAL DATA:  31 year old female currently in remission and under surveillance for non-Hodgkin's lymphoma presents with a palpable left breast lump for 1 week with associated tenderness. EXAM: DIGITAL DIAGNOSTIC BILATERAL MAMMOGRAM WITH TOMOSYNTHESIS; ULTRASOUND LEFT BREAST LIMITED TECHNIQUE: Bilateral digital diagnostic mammography and breast tomosynthesis was performed.; Targeted ultrasound examination of the left breast was performed. COMPARISON:  Previous exam(s). ACR Breast Density Category c: The breast tissue is heterogeneously dense, which may obscure small masses. FINDINGS: A radiopaque BB was placed at the site of the patient's palpable lump in the upper inner left breast. A round, circumscribed hyperdense mass is partially visualized deep to the radiopaque BB. There are no focal masses, distortions or calcifications within the remainder of either breast. Targeted ultrasound is performed, showing a round, irregular hypoechoic mass with associated peripheral vascularity at the 11 o'clock position 4 cm from the nipple on the left. It measures 2.4 x 1.9 x 1.8 cm. This correlates with the patient's palpable lump. Evaluation of the left axilla demonstrates no suspicious  lymphadenopathy. IMPRESSION: 1. Suspicious 2.4 cm mass along the 11 o'clock axis of the left breast. Recommend ultrasound-guided biopsy. 2. No suspicious left axillary lymphadenopathy. 3. No mammographic evidence of malignancy on the right. RECOMMENDATION: Ultrasound-guided biopsy of the left breast. I have discussed the findings and recommendations with the patient. If applicable, a reminder letter will be sent to the patient regarding the next appointment. BI-RADS CATEGORY  4: Suspicious. Electronically  Signed   By: Kristopher Oppenheim M.D.   On: 03/03/2022 10:13   Labs:  CBC: Recent Labs    10/17/21 1353 03/28/22 0805 03/28/22 0855  WBC 6.0 8.6 8.2  HGB 13.0 12.1 11.7*  HCT 39.5 37.1 35.8*  PLT 233 283 275    COAGS: No results for input(s): "INR", "APTT" in the last 8760 hours.  BMP: Recent Labs    10/17/21 1353 03/28/22 0805  NA 139 137  K 4.3 4.5  CL 104 105  CO2 28 26  GLUCOSE 89 105*  BUN 12 9  CALCIUM 9.2 9.1  CREATININE 0.87 0.66  GFRNONAA >60 >60    LIVER FUNCTION TESTS: Recent Labs    10/17/21 1353 03/28/22 0805  BILITOT 0.4 0.3  AST 21 15  ALT 14 9  ALKPHOS 47 60  PROT 7.2 7.5  ALBUMIN 4.0 3.8    TUMOR MARKERS: No results for input(s): "AFPTM", "CEA", "CA199", "CHROMGRNA" in the last 8760 hours.  Assessment and Plan:  Angel Tate is a 31 yo female with PMH significant for anxiety, depression being seen today for image-guided bone marrow biopsy in the setting of recurrence of anaplastic ALK-positive large cell lymphoma. The patient is doing well today. Of note, she declines a urine pregnancy test prior to her procedure stating that she is currently menstruating and is not pregnant. She has signed a waiver acknowledging the radiation risk if she were pregnant. The case has been reviewed with Dr Kathlene Cote and the patient is set to proceed with image-guided bone marrow biopsy on 03/28/22.  Risks and benefits of image-guided bone marrow biopsy was  discussed with the patient and/or patient's family including, but not limited to bleeding, infection, damage to adjacent structures or low yield requiring additional tests.  All of the questions were answered and there is agreement to proceed.  Consent signed and in chart.   Thank you for this interesting consult.  I greatly enjoyed meeting [facility] and look forward to participating in their care.  A copy of this report was sent to the requesting provider on this date.  Electronically Signed: Lura Em, PA-C 03/28/2022, 9:14 AM   I spent a total of    25 Minutes in face to face in clinical consultation, greater than 50% of which was counseling/coordinating care for image-guided bone marrow biopsy.

## 2022-03-28 NOTE — Procedures (Signed)
Interventional Radiology Procedure Note  Procedure: CT guided bone marrow aspiration and biopsy  Complications: None  EBL: < 10 mL  Findings: Aspirate and core biopsy performed of bone marrow in right iliac bone.  Plan: Bedrest supine x 1 hrs  Lynda Wanninger T. Tula Schryver, M.D Pager:  [phone]   

## 2022-03-28 NOTE — Discharge Instructions (Signed)
Bone Marrow Aspiration and Bone Marrow Biopsy, Adult, Care After This sheet gives you information about how to care for yourself after your procedure. If you have problems or questions, contact your health care provider.  What can I expect after the procedure?  After the procedure, it is common to have: Mild pain and tenderness. Swelling. Bruising.  Follow these instructions at home: Take over-the-counter or prescription medicines only as told by your health care provider. You may shower tomorrow Remove band aid tomorrow, replace with another bandaid if  site has any drainage from biopsy site. Wash your hands with soap and water before you touch your biopsy site  If soap and water are not available, use hand sanitizer. Change your dressing frequently for bleeding and/or drainage. Check your puncture site every day for signs of infection. Check for: More redness, swelling, or pain. More fluid or blood. Warmth. Pus or a bad smell. Return to your normal activities in 24hours.  Do not drive for 24 hours if you were given a medicine to help you relax (sedative). Keep all follow-up visits as told by your health care provider. This is important. Contact a health care provider if: You have more redness, swelling, or pain around the puncture site. You have more fluid or blood coming from the puncture site. Your puncture site feels warm to the touch. You have pus or a bad smell coming from the puncture site. You have a fever. Your pain is not controlled with medicine. This information is not intended to replace advice given to you by your health care provider. Make sure you discuss any questions you have with your health care provider. Document Released: 11/04/2004 Document Revised: 11/05/2015 Document Reviewed: 09/29/2015 Elsevier Interactive Patient Education  2018 [employer] Inc[employer] 

## 2022-03-29 ENCOUNTER — Other Ambulatory Visit: Payer: Self-pay

## 2022-03-30 ENCOUNTER — Telehealth: Payer: Self-pay

## 2022-03-30 LAB — SURGICAL PATHOLOGY

## 2022-03-30 NOTE — Telephone Encounter (Signed)
Pt concerned that she might have infection on biopsy site. Dr. Tasia Catchings would like for pt to be evaluated with [facility].  Pt prefers morning appt, can one of you set that up for her. She is scheduled for and echo at 10am on 12/1.

## 2022-03-30 NOTE — Telephone Encounter (Signed)
Please advise:  Patient is concerned of the left breast mass that is positive for Lymphoma.  The area is getting bigger, feeling irritated with tenderness, red and dry.  Also has what looks like a "blood blister" on the area.  No fever or drainage from the area.

## 2022-03-31 ENCOUNTER — Inpatient Hospital Stay: Payer: BC Managed Care – PPO | Attending: Nurse Practitioner | Admitting: Nurse Practitioner

## 2022-03-31 ENCOUNTER — Other Ambulatory Visit: Payer: Self-pay

## 2022-03-31 ENCOUNTER — Other Ambulatory Visit: Payer: Self-pay | Admitting: Nurse Practitioner

## 2022-03-31 ENCOUNTER — Ambulatory Visit
Admission: RE | Admit: 2022-03-31 | Discharge: 2022-03-31 | Disposition: A | Discharge status: Home / Self Care | Payer: BC Managed Care – PPO | Source: Ambulatory Visit | Attending: Oncology | Admitting: Oncology

## 2022-03-31 VITALS — BP 121/72 | HR 85 | Temp 98.9°F | Resp 18 | Wt 159.0 lb

## 2022-03-31 DIAGNOSIS — G893 Neoplasm related pain (acute) (chronic): Secondary | ICD-10-CM | POA: Diagnosis present

## 2022-03-31 DIAGNOSIS — R110 Nausea: Secondary | ICD-10-CM | POA: Insufficient documentation

## 2022-03-31 DIAGNOSIS — I340 Nonrheumatic mitral (valve) insufficiency: Secondary | ICD-10-CM | POA: Insufficient documentation

## 2022-03-31 DIAGNOSIS — Z79899 Other long term (current) drug therapy: Secondary | ICD-10-CM | POA: Diagnosis not present

## 2022-03-31 DIAGNOSIS — M25512 Pain in left shoulder: Secondary | ICD-10-CM | POA: Diagnosis not present

## 2022-03-31 DIAGNOSIS — Z5181 Encounter for therapeutic drug level monitoring: Secondary | ICD-10-CM | POA: Insufficient documentation

## 2022-03-31 DIAGNOSIS — R519 Headache, unspecified: Secondary | ICD-10-CM | POA: Insufficient documentation

## 2022-03-31 DIAGNOSIS — M549 Dorsalgia, unspecified: Secondary | ICD-10-CM | POA: Diagnosis not present

## 2022-03-31 DIAGNOSIS — C8464 Anaplastic large cell lymphoma, ALK-positive, lymph nodes of axilla and upper limb: Secondary | ICD-10-CM

## 2022-03-31 DIAGNOSIS — C8468 Anaplastic large cell lymphoma, ALK-positive, lymph nodes of multiple sites: Secondary | ICD-10-CM | POA: Diagnosis present

## 2022-03-31 DIAGNOSIS — Z7960 Long term (current) use of unspecified immunomodulators and immunosuppressants: Secondary | ICD-10-CM | POA: Insufficient documentation

## 2022-03-31 DIAGNOSIS — Z5111 Encounter for antineoplastic chemotherapy: Secondary | ICD-10-CM | POA: Insufficient documentation

## 2022-03-31 DIAGNOSIS — Z8041 Family history of malignant neoplasm of ovary: Secondary | ICD-10-CM | POA: Diagnosis not present

## 2022-03-31 DIAGNOSIS — R112 Nausea with vomiting, unspecified: Secondary | ICD-10-CM | POA: Diagnosis not present

## 2022-03-31 LAB — ECHOCARDIOGRAM COMPLETE
AR max vel: 2.79 cm2
AV Area VTI: 2.54 cm2
AV Area mean vel: 2.54 cm2
AV Mean grad: 4 mmHg
AV Peak grad: 7.2 mmHg
Ao pk vel: 1.34 m/s
Area-P 1/2: 4.77 cm2
S' Lateral: 3.1 cm

## 2022-03-31 MED ORDER — MELOXICAM 10 MG PO CAPS: 10.0000 mg | ORAL_CAPSULE | Freq: Every day | ORAL | 0 refills | Status: DC

## 2022-03-31 MED ORDER — SULFAMETHOXAZOLE-TRIMETHOPRIM 800-160 MG PO TABS: 1.0000 | ORAL_TABLET | Freq: Two times a day (BID) | ORAL | 0 refills | Status: AC

## 2022-03-31 MED ORDER — OXYCODONE HCL 5 MG PO TABS: 10.0000 mg | ORAL_TABLET | Freq: Four times a day (QID) | ORAL | 0 refills | Status: DC | PRN

## 2022-03-31 NOTE — Progress Notes (Signed)
*  PRELIMINARY RESULTS* Echocardiogram 2D Echocardiogram has been performed.  Angel Tate 03/31/2022, 10:51 AM

## 2022-03-31 NOTE — Progress Notes (Signed)
Patient reports upper back pain

## 2022-03-31 NOTE — Progress Notes (Signed)
[facility] [place]. 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  Initially the mass did not bother her.  Patient developed a rash in the right axillary/upper outer quadrant of the breast and had additional work-up.  Patient was treated with a 10-day course of doxycycline for possible infection.  Did not improve.  #02/04/2020, targeted ultrasound showed right axillary mass measuring 6.2 x 4 by 4 cm.  There are 2 adjacent smaller axillary mass measuring 2.0 x 1.2 x 1.4 cm and 0.8 x 0.7 x 0.9 cm.  Mammogram showed dense fibroglandular.  No suspicious mass or malignant type microcalcifications or distortion detected in either breast.  # Patient underwent ultrasound-guided core biopsy of the right axillary mass. 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  Ki-67 80-90%, Flow cytometry has insufficient cells for analysis.    02/17/2020 Bone Marrow Biopsy   Bone marrow biopsy is negative   02/18/2020 Echocardiogram    Echocardiogram showed normal LVEF   03/02/2020 Imaging   PET scan showed right axillary pathological adenopathy, largest lymph node 3.5 cm with maximum SUV 42.8.  An adjacent smaller peripheral right axillary lymph node measuring 1.3 cm with SUV of 23.3. Anterior mediastinal density favors benign thymic tissue maximum SUV 3.0 which is due [place] 4, far below the level of last has lymphadenopathy.   03/03/2020 - 05/30/2020 Chemotherapy   03/03/2020 - 05/30/2020 4 cycles of BV AVD Q21 days.   Patient does not desire to preserve fertility.  She has IUD for contraceptive measures.   05/20/2020 Imaging   PET showed complete response   06/02/2020 - 06/25/2020 Chemotherapy   2 cycles of BV AVD Q21 days   09/01/2020 Imaging   PET scan  1. No signs to suggest residual or recurrent FDG avid tumor.2. Resolution of previously noted generalized marrow hypermetabolism.3. FDG avid brown fat noted within the neck and chest compatible with benign physiologic activity.   09/13/2020 Procedure   Her Mediport was removed by Dr. Lucky Cowboy.   03/01/2021 Imaging   Surveillance PET showed No evidence of active lymphoma.   10/13/2021 Imaging   Surveillance PET showed  1. No evidence of lymphoma recurrence. No lymphadenopathy. Normal spleen and bone marrow.2. Particular attention directed to the RIGHT axilla.3. Benign cyst of the LEFT ovary   03/03/2022 Mammogram   Patient palpated left breast mass with rapid growth.   Bilateral diagnostic mammogram/ left axilla  showed 1. Suspicious 2.4 cm mass along the 11 o'clock axis of the left breast. Recommend ultrasound-guided biopsy. 2. No suspicious left axillary lymphadenopathy. 3. No mammographic evidence of  malignancy on the right.   03/06/2022 Relapse/Recurrence   Left breast mass biopsy showed ALK positive anaplastic large cell lymphoma. Ki67 100% Flowcytometry is overall non contributory.    03/15/2022 Imaging   PET restaging showed  1. New hypermetabolic round mass  in the medial LEFT breast consistent with lymphoma recurrence. 2. New lesion in the mid body of the pancreas consistent with lymphoma recurrence/metastasis. 3. Very small hypermetabolic RIGHT supraclavicular node is concerning for lymphoma. 4. Two pulmonary nodules in the RIGHT lung are new from prior. Indeterminate nodules.    04/04/2022 -  Chemotherapy   Patient is on Treatment Plan : NON-HODGKINS LYMPHOMA ICE q21d       Interval history- Patient is 31 year old recently diagnosed with lymphoma s/p left breast biopsy who [facility] for complaints of left breast pain. She feels like known left breast mass is enlarging, becoming more red, and has a new spot. She's worried of possible infection. Mass feels warm to touch. She's been taking tylenol and intermittently oxycodone 5 mg with minimal relief. Also has pain of left back/shoulder blade which she's been applying heating pad to. Plan is to start chemotherapy next week. No fevers or chills and otherwise feels at baseline.   ECOG FS:1 - Symptomatic but completely ambulatory  Review of systems- Review of Systems  Constitutional:  Positive for malaise/fatigue. Negative for chills and fever.  Musculoskeletal:  Positive for back pain (per hpi).  Skin:        Per hpi     No Known Allergies  Past Medical History:  Diagnosis Date   Anxiety    Depression    Medical history non-contributory     Past Surgical History:  Procedure Laterality Date   BREAST BIOPSY Left 03/06/2022   [place] LT BREAST BX W LOC DEV 1ST LESION IMG BX SPEC US GUIDE 03/06/2022 [facility] MAMMOGRAPHY   DILATION AND CURETTAGE OF UTERUS     DILATION AND EVACUATION N/A 02/17/2017   Procedure: DILATATION AND EVACUATION;  Surgeon: Janyth Pupa, DO;  Location: [facility];  Service: Gynecology;  Laterality: N/A;   PORTA CATH INSERTION N/A 02/23/2020   Procedure: PORTA CATH INSERTION;  Surgeon: Algernon Huxley, MD;  Location: [facility];  Service:  Cardiovascular;  Laterality: N/A;   PORTA CATH REMOVAL N/A 09/13/2020   Procedure: PORTA CATH REMOVAL;  Surgeon: Algernon Huxley, MD;  Location: [facility];  Service: Cardiovascular;  Laterality: N/A;    Social History   Socioeconomic History   Marital status: Married    Spouse name: Cody   Number of children: 1   Years of education: Not on file   Highest education level: Not on file  Occupational History   Occupation: social media- marketing   Tobacco Use   Smoking status: Never   Smokeless tobacco: Never  Vaping Use   Vaping Use: Never used  Substance and Sexual Activity   Alcohol use: Yes    Comment: socially   Drug use: No   Sexual activity: Yes    Birth control/protection: None  Other Topics Concern   Not on file  Social History Narrative   Not on file   Social Determinants of Health   Financial Resource Strain: Low Risk  (01/16/2019)   Overall Financial Resource Strain (CARDIA)    Difficulty of Paying Living Expenses: Not hard at all  Food Insecurity: No Food Insecurity (01/16/2019)   Hunger Vital Sign    Worried About Running Out  of Food in the Last Year: Never true    [place] in the Last Year: Never true  Transportation Needs: No Transportation Needs (01/16/2019)   PRAPARE - Hydrologist (Medical): No    Lack of Transportation (Non-Medical): No  Physical Activity: Unknown (01/19/2019)   Exercise Vital Sign    Days of Exercise per Week: Patient refused    Minutes of Exercise per Session: Patient refused  Stress: No Stress Concern Present (01/19/2019)   [place]    Feeling of Stress : Only a little  Social Connections: Unknown (01/19/2019)   Social Connection and Isolation Panel [NHANES]    Frequency of Communication with Friends and Family: Patient refused    Frequency of Social Gatherings with Friends and Family: Patient refused    Attends Religious  Services: Patient refused    Active Member of Clubs or Organizations: Patient refused    Attends Archivist Meetings: Patient refused    Marital Status: Patient refused  Intimate Partner Violence: Not At Risk (01/19/2019)   Humiliation, Afraid, Rape, and Kick questionnaire    Fear of Current or Ex-Partner: No    Emotionally Abused: No    Physically Abused: No    Sexually Abused: No    Family History  Problem Relation Age of Onset   Asthma Maternal Aunt    Hypertension Maternal Grandmother    Ovarian cancer Paternal Grandmother        dx 67s-80s     Current Outpatient Medications:    acetaminophen (TYLENOL) 500 MG tablet, Take 500 mg by mouth every 6 (six) hours as needed., Disp: , Rfl:    allopurinol (ZYLOPRIM) 300 MG tablet, Take 1 tablet (300 mg total) by mouth daily. (Patient not taking: Reported on 03/28/2022), Disp: 30 tablet, Rfl: 2   ALPRAZolam (XANAX) 0.5 MG tablet, Take 0.5 mg by mouth at bedtime as needed for anxiety. (Patient not taking: Reported on 03/28/2022), Disp: , Rfl:    dexamethasone (DECADRON) 4 MG tablet, Take 2 tablets (8 mg total) by mouth daily. Start the day after chemotherapy for 2 days. Take with food., Disp: 30 tablet, Rfl: 1   escitalopram (LEXAPRO) 10 MG tablet, Take 10 mg by mouth daily. (Patient not taking: Reported on 03/28/2022), Disp: , Rfl:    escitalopram (LEXAPRO) 5 MG tablet, Take 5 mg by mouth daily. (Patient not taking: Reported on 03/21/2022), Disp: , Rfl:    gabapentin (NEURONTIN) 100 MG capsule, Take 1 capsule (100 mg total) by mouth 2 (two) times daily. (Patient not taking: Reported on 06/02/2020), Disp: 30 capsule, Rfl: 0   ibuprofen (ADVIL,MOTRIN) 200 MG tablet, Take 3 tablets (600 mg total) by mouth every 6 (six) hours as needed (for pain.). (Patient not taking: Reported on 03/08/2021), Disp: 30 tablet, Rfl: 0   Levonorgestrel (SKYLA) 13.5 MG IUD, , Disp: , Rfl:    loperamide (IMODIUM) 2 MG capsule, Take 1 capsule (2 mg total) by  mouth See admin instructions. Initial: 4 mg, followed by 2 mg every 2 to 4 hours or after each loose stool; Max 16 mg /24 hours (Patient not taking: Reported on 09/13/2020), Disp: 60 capsule, Rfl: 1   LORazepam (ATIVAN) 0.5 MG tablet, Take 1 tablet (0.5 mg total) by mouth every 8 (eight) hours as needed for anxiety. (Patient not taking: Reported on 09/13/2020), Disp: 60 tablet, Rfl: 0   ondansetron (ZOFRAN) 8 MG tablet, Take 1 tablet (8 mg  total) by mouth every 8 (eight) hours as needed for nausea or vomiting. Start 2 days after chemotherapy., Disp: 30 tablet, Rfl: 1   oxyCODONE (OXY IR/ROXICODONE) 5 MG immediate release tablet, Take 1 tablet (5 mg total) by mouth every 6 (six) hours as needed for severe pain., Disp: 30 tablet, Rfl: 0   prochlorperazine (COMPAZINE) 10 MG tablet, Take 1 tablet (10 mg total) by mouth every 6 (six) hours as needed for nausea or vomiting., Disp: 30 tablet, Rfl: 1   promethazine (PHENERGAN) 25 MG tablet, Take 1 tablet (25 mg total) by mouth every 6 (six) hours as needed for nausea or vomiting. (Patient not taking: Reported on 09/03/2020), Disp: 120 tablet, Rfl: 2   venlafaxine (EFFEXOR) 37.5 MG tablet, TAKE 1 TABLET BY MOUTH EVERY DAY (Patient not taking: Reported on 09/13/2020), Disp: 90 tablet, Rfl: 1  Physical exam:  Vitals:   03/31/22 1123 03/31/22 1125  BP:  121/72  Pulse:  85  Resp:  18  Temp:  98.9 F (37.2 C)  TempSrc:  Tympanic  Weight: 159 lb (72.1 kg)    Physical Exam Constitutional:      Appearance: She is not ill-appearing.  Cardiovascular:     Rate and Rhythm: Normal rate and regular rhythm.  Pulmonary:     Effort: No respiratory distress.  Musculoskeletal:     Comments: Tenderness to palpation of right shoulder- 5 cm right of vertebrae. No palpable nodularity or other abnormality.   Lymphadenopathy:     Cervical: No cervical adenopathy.  Skin:    Findings: Lesion (mass of left breast is firm and fixed. No fluctuant areas palpated.) present.   Neurological:     Mental Status: She is alert and oriented to person, place, and time.  Psychiatric:        Mood and Affect: Mood normal.        Behavior: Behavior normal.           Latest Ref Rng & Units 03/28/2022    8:05 AM  CMP  Glucose 70 - 99 mg/dL 105   BUN 6 - 20 mg/dL 9   Creatinine 0.44 - 1.00 mg/dL 0.66   Sodium 135 - 145 mmol/L 137   Potassium 3.5 - 5.1 mmol/L 4.5   Chloride 98 - 111 mmol/L 105   CO2 22 - 32 mmol/L 26   Calcium 8.9 - 10.3 mg/dL 9.1   Total Protein 6.5 - 8.1 g/dL 7.5   Total Bilirubin 0.3 - 1.2 mg/dL 0.3   Alkaline Phos 38 - 126 U/L 60   AST 15 - 41 U/L 15   ALT 0 - 44 U/L 9       Latest Ref Rng & Units 03/28/2022    8:55 AM  CBC  WBC 4.0 - 10.5 K/uL 8.2   Hemoglobin 12.0 - 15.0 g/dL 11.7   Hematocrit 36.0 - 46.0 % 35.8   Platelets 150 - 400 K/uL 275    No images are attached to the encounter.  CT BONE MARROW BIOPSY & ASPIRATION  Result Date: 03/28/2022 CLINICAL DATA:  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 EXAM: CT GUIDED BONE MARROW ASPIRATION AND BIOPSY ANESTHESIA/SEDATION: Moderate (conscious) sedation was employed during this procedure. A total of Versed 2.0 mg and Fentanyl 100 mcg was administered intravenously by radiology nursing. Moderate Sedation Time: 27 minutes. The patient's level of consciousness and vital signs were monitored continuously by radiology nursing throughout the procedure under my direct supervision. PROCEDURE: The procedure  risks, benefits, and alternatives were explained to the patient. Questions regarding the procedure were encouraged and answered. The patient understands and consents to the procedure. A time out was performed prior to initiating the procedure. The right gluteal region was prepped with chlorhexidine. Sterile gown and sterile gloves were used for the procedure. Local anesthesia was provided with 1% Lidocaine. Under CT guidance, an 11 gauge On Control  bone cutting needle was advanced from a posterior approach into the right iliac bone. Needle positioning was confirmed with CT. Initial non heparinized and heparinized aspirate samples were obtained of bone marrow. Core biopsy was performed via the On Control drill needle. Two separate core biopsy samples were obtained. COMPLICATIONS: None FINDINGS: Inspection of initial non heparinized aspirate for particles was hampered by clotting. For this reason, 2 separate core biopsy samples were obtained. IMPRESSION: CT guided bone marrow biopsy of right posterior iliac bone with both aspirate and core samples obtained. Electronically Signed   By: Aletta Edouard M.D.   On: 03/28/2022 11:07   MR Brain W Wo Contrast  Result Date: 03/26/2022 CLINICAL DATA:  ALK positive large-cell lymphoma EXAM: MRI HEAD WITHOUT AND WITH CONTRAST TECHNIQUE: Multiplanar, multiecho pulse sequences of the brain and surrounding structures were obtained without and with intravenous contrast. CONTRAST:  7.20m GADAVIST GADOBUTROL 1 MMOL/ML IV SOLN COMPARISON:  None Available. FINDINGS: Brain: No abnormal parenchymal or meningeal enhancement. No restricted diffusion to suggest acute or subacute infarct. No acute hemorrhage, mass, mass effect, or midline shift. No hemosiderin deposition to suggest remote hemorrhage. No hydrocephalus or extra-axial collection. No abnormal T2 signal to suggest demyelinating disease. Vascular: Normal arterial flow voids. Normal arterial and venous enhancement. Skull and upper cervical spine: Normal marrow signal. Sinuses/Orbits: Mucous retention cyst in the left maxillary sinus. The orbits are unremarkable. Other: The mastoids are well aerated. IMPRESSION: No acute intracranial process. No evidence of intracranial metastatic disease. Electronically Signed   By: AMerilyn BabaM.D.   On: 03/26/2022 00:59   NM PET Image Restage (PS) Skull Base to Thigh (F-18 FDG)  Result Date: 03/15/2022 CLINICAL DATA:  Subsequent  treatment strategy for anaplastic alk positive large-cell lymphoma. EXAM: NUCLEAR MEDICINE PET SKULL BASE TO THIGH TECHNIQUE: 9.2 mCi F-18 FDG was injected intravenously. Full-ring PET imaging was performed from the skull base to thigh after the radiotracer. CT data was obtained and used for attenuation correction and anatomic localization. Fasting blood glucose: 85 mg/dl COMPARISON:  PET-CT 10/12/2021 FINDINGS: Mediastinal blood pool activity: SUV max 1.6 Liver activity: SUV max 2.0 NECK: Hypermetabolic focus in the RIGHT supraclavicular neck appears to localize to a small node measuring 5 mm image 52/2. Activity is intense for size with SUV max equal 5.7 (image 53 )PET data set Incidental CT findings: None. CHEST: Within the medial LEFT breast there is a new round mass measuring 2.9 cm in diameter and with intense metabolic activity (SUV max equal 34.3). Two small RIGHT lung nodules are new from prior. 8 mm nodule on image 91/2 and 5 mm nodule on image 87/2. These nodules appear associated with the horizontal fissure. No faint radiotracer activity associated with the larger nodule Incidental CT findings: None. ABDOMEN/PELVIS: New intense focal activity localizes to the body of the pancreas with SUV max equal 12.2 (image 129. Lesion in the subtly evident on the noncontrast CT measuring approximately 18 mm. Incidental CT findings: None. SKELETON: No focal hypermetabolic activity to suggest skeletal metastasis. Incidental CT findings: None. IMPRESSION: 1. New hypermetabolic round mass in the medial LEFT  breast consistent with lymphoma recurrence. 2. New lesion in the mid body of the pancreas consistent with lymphoma recurrence/metastasis. 3. Very small hypermetabolic RIGHT supraclavicular node is concerning for lymphoma. 4. Two pulmonary nodules in the RIGHT lung are new from prior. Indeterminate nodules. Recommend close attention on follow-up. These results will be called to the ordering clinician or representative  by the Radiologist Assistant, and communication documented in the PACS or [employer]. Electronically Signed   By: Suzy Bouchard M.D.   On: 03/15/2022 12:33   [place] LT BREAST BX W LOC DEV 1ST LESION IMG BX SPEC US GUIDE  Addendum Date: 03/10/2022   ADDENDUM REPORT: 03/10/2022 10:34 ADDENDUM: Pathology revealed ALK POSITIVE ANAPLASTIC LARGE CELL LYMPHOMA of the LEFT breast mass, 11 o'clock, (hydromark clip). FLOW CYTOMETRY IS OVERALL NONCONTRIBUTORY. This was found to be concordant by Dr. Kristopher Oppenheim. Pathology results were discussed with the patient by telephone. The patient reported doing well after the biopsy with tenderness at the site. Post biopsy instructions and care were reviewed and questions were answered. The patient was encouraged to call [facility] for any additional concerns. My direct phone number was provided. The patient has a diagnosis of LYMPHOMA and should follow her outlined treatment plan. Dr. Earlie Server at [facility] was notified of biopsy results via secure EPIC message on March 09, 2022. Pathology results reported by Terie Purser, RN on 03/09/2022. Electronically Signed   By: Kristopher Oppenheim M.D.   On: 03/10/2022 10:34   Result Date: 03/10/2022 CLINICAL DATA:  31 year old female with an indeterminate left breast mass. EXAM: ULTRASOUND GUIDED LEFT BREAST CORE NEEDLE BIOPSY COMPARISON:  Previous exam(s). PROCEDURE: I met with the patient and we discussed the procedure of ultrasound-guided biopsy, including benefits and alternatives. We discussed the high likelihood of a successful procedure. We discussed the risks of the procedure, including infection, bleeding, tissue injury, clip migration, and inadequate sampling. Informed written consent was given. The usual time-out protocol was performed immediately prior to the procedure. Lesion quadrant: Upper inner quadrant Using sterile technique and 1% Lidocaine as local anesthetic, under  direct ultrasound visualization, a 14 gauge spring-loaded device was used to perform biopsy of a mass at the 11 o'clock position using a inferior approach. At the conclusion of the procedure a HydroMARK tissue marker clip was deployed into the biopsy cavity. Follow up 2 view mammogram was performed and dictated separately. IMPRESSION: Ultrasound guided biopsy of the left breast. No apparent complications. Electronically Signed: By: Kristopher Oppenheim M.D. On: 03/06/2022 09:24  MM CLIP PLACEMENT LEFT  Result Date: 03/06/2022 CLINICAL DATA:  Status post left breast ultrasound biopsy. EXAM: 3D DIAGNOSTIC LEFT MAMMOGRAM POST ULTRASOUND BIOPSY COMPARISON:  Previous exam(s). FINDINGS: 3D Mammographic images were obtained following ultrasound guided biopsy of the upper left breast. The biopsy marking clip is in expected position at the site of biopsy. IMPRESSION: Appropriate positioning of the HydroMARK biopsy marking clip at the site of biopsy in the upper inner left breast. Final Assessment: Post Procedure Mammograms for Marker Placement Electronically Signed   By: Kristopher Oppenheim M.D.   On: 03/06/2022 09:38  MM DIAG BREAST TOMO BILATERAL  Result Date: 03/03/2022 CLINICAL DATA:  31 year old female currently in remission and under surveillance for non-Hodgkin's lymphoma presents with a palpable left breast lump for 1 week with associated tenderness. EXAM: DIGITAL DIAGNOSTIC BILATERAL MAMMOGRAM WITH TOMOSYNTHESIS; ULTRASOUND LEFT BREAST LIMITED TECHNIQUE: Bilateral digital diagnostic mammography and breast tomosynthesis was performed.; Targeted ultrasound examination of the  left breast was performed. COMPARISON:  Previous exam(s). ACR Breast Density Category c: The breast tissue is heterogeneously dense, which may obscure small masses. FINDINGS: A radiopaque BB was placed at the site of the patient's palpable lump in the upper inner left breast. A round, circumscribed hyperdense mass is partially visualized deep to the  radiopaque BB. There are no focal masses, distortions or calcifications within the remainder of either breast. Targeted ultrasound is performed, showing a round, irregular hypoechoic mass with associated peripheral vascularity at the 11 o'clock position 4 cm from the nipple on the left. It measures 2.4 x 1.9 x 1.8 cm. This correlates with the patient's palpable lump. Evaluation of the left axilla demonstrates no suspicious lymphadenopathy. IMPRESSION: 1. Suspicious 2.4 cm mass along the 11 o'clock axis of the left breast. Recommend ultrasound-guided biopsy. 2. No suspicious left axillary lymphadenopathy. 3. No mammographic evidence of malignancy on the right. RECOMMENDATION: Ultrasound-guided biopsy of the left breast. I have discussed the findings and recommendations with the patient. If applicable, a reminder letter will be sent to the patient regarding the next appointment. BI-RADS CATEGORY  4: Suspicious. Electronically Signed   By: Kristopher Oppenheim M.D.   On: 03/03/2022 10:13  US BREAST LTD UNI LEFT INC AXILLA  Result Date: 03/03/2022 CLINICAL DATA:  31 year old female currently in remission and under surveillance for non-Hodgkin's lymphoma presents with a palpable left breast lump for 1 week with associated tenderness. EXAM: DIGITAL DIAGNOSTIC BILATERAL MAMMOGRAM WITH TOMOSYNTHESIS; ULTRASOUND LEFT BREAST LIMITED TECHNIQUE: Bilateral digital diagnostic mammography and breast tomosynthesis was performed.; Targeted ultrasound examination of the left breast was performed. COMPARISON:  Previous exam(s). ACR Breast Density Category c: The breast tissue is heterogeneously dense, which may obscure small masses. FINDINGS: A radiopaque BB was placed at the site of the patient's palpable lump in the upper inner left breast. A round, circumscribed hyperdense mass is partially visualized deep to the radiopaque BB. There are no focal masses, distortions or calcifications within the remainder of either breast. Targeted  ultrasound is performed, showing a round, irregular hypoechoic mass with associated peripheral vascularity at the 11 o'clock position 4 cm from the nipple on the left. It measures 2.4 x 1.9 x 1.8 cm. This correlates with the patient's palpable lump. Evaluation of the left axilla demonstrates no suspicious lymphadenopathy. IMPRESSION: 1. Suspicious 2.4 cm mass along the 11 o'clock axis of the left breast. Recommend ultrasound-guided biopsy. 2. No suspicious left axillary lymphadenopathy. 3. No mammographic evidence of malignancy on the right. RECOMMENDATION: Ultrasound-guided biopsy of the left breast. I have discussed the findings and recommendations with the patient. If applicable, a reminder letter will be sent to the patient regarding the next appointment. BI-RADS CATEGORY  4: Suspicious. Electronically Signed   By: Kristopher Oppenheim M.D.   On: 03/03/2022 10:13   Assessment and plan- Patient is a 31 y.o. female diagnosed with anaplastic ALK positive large cell lymphoma of axillary lymph node who presents to clinic for evaluation of wound post biopsy.   Wound check- underwent bone marrow biopsy on 03/28/22. Breast biopsy was reported as 11:00, 4 cmfn. Etiology unclear but suspect disease > infection. However, will treat with bactrim for empiric coverage. Also feel she would benefit from anti-inflammatories. Start mobic 10 mg daily. Reviewed not to take with ibuprofen. She can continue tylenol, 1062m every 8 hours, not to exceed 3000 mg in 24 hour period. Hold steroids. Will increase oxycodone to 10 mg for pain not relieved tylenol and mobic.  Shoulder pain- suspect referred  pain. Not corresponding mass on recent pet and no palpable nodularity on exam. Continue heat, topicals.   Follow up with Dr. Tasia Catchings next week for starting chemotherapy as planned. Notify clinic if symptoms worsen in interim.    Visit Diagnosis 1. Cancer associated pain   2. 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 She also understands that She can call clinic at any time with any questions, concerns, or complaints.   Thank you for allowing me to participate in the care of this very pleasant patient.   Beckey Rutter, DNP, AGNP-C [facility] [place]

## 2022-04-03 ENCOUNTER — Other Ambulatory Visit: Payer: Self-pay | Admitting: Internal Medicine

## 2022-04-03 ENCOUNTER — Other Ambulatory Visit: Payer: Self-pay | Admitting: Oncology

## 2022-04-03 DIAGNOSIS — C8464 Anaplastic large cell lymphoma, ALK-positive, lymph nodes of axilla and upper limb: Secondary | ICD-10-CM

## 2022-04-03 NOTE — Progress Notes (Signed)
Patient for LP Methotrexate Inj on Tues 04/04/2022, I called and spoke with the patient on the phone and gave pre-procedure instructions. Pt was made aware to be here at 7:30a at the new entrance. Pt stated understanding. Called 04/03/2022

## 2022-04-04 ENCOUNTER — Inpatient Hospital Stay: Payer: BC Managed Care – PPO

## 2022-04-04 ENCOUNTER — Encounter (HOSPITAL_COMMUNITY): Payer: Self-pay | Admitting: Oncology

## 2022-04-04 ENCOUNTER — Encounter: Payer: Self-pay | Admitting: Oncology

## 2022-04-04 ENCOUNTER — Other Ambulatory Visit: Payer: BC Managed Care – PPO

## 2022-04-04 ENCOUNTER — Ambulatory Visit: Payer: BC Managed Care – PPO | Admitting: Oncology

## 2022-04-04 ENCOUNTER — Ambulatory Visit
Admission: RE | Admit: 2022-04-04 | Discharge: 2022-04-04 | Disposition: A | Discharge status: Home / Self Care | Payer: BC Managed Care – PPO | Source: Ambulatory Visit | Attending: Oncology | Admitting: Oncology

## 2022-04-04 ENCOUNTER — Encounter: Payer: Self-pay | Admitting: Radiology

## 2022-04-04 ENCOUNTER — Inpatient Hospital Stay: Payer: BC Managed Care – PPO | Admitting: Oncology

## 2022-04-04 ENCOUNTER — Inpatient Hospital Stay (HOSPITAL_BASED_OUTPATIENT_CLINIC_OR_DEPARTMENT_OTHER): Payer: BC Managed Care – PPO | Admitting: Oncology

## 2022-04-04 VITALS — BP 116/71 | HR 82 | Temp 99.2°F | Resp 15

## 2022-04-04 VITALS — BP 120/69 | HR 89 | Temp 98.2°F | Ht 62.0 in | Wt 160.0 lb

## 2022-04-04 DIAGNOSIS — R112 Nausea with vomiting, unspecified: Secondary | ICD-10-CM | POA: Diagnosis not present

## 2022-04-04 DIAGNOSIS — G893 Neoplasm related pain (acute) (chronic): Secondary | ICD-10-CM

## 2022-04-04 DIAGNOSIS — C8468 Anaplastic large cell lymphoma, ALK-positive, lymph nodes of multiple sites: Secondary | ICD-10-CM | POA: Diagnosis not present

## 2022-04-04 DIAGNOSIS — G971 Other reaction to spinal and lumbar puncture: Secondary | ICD-10-CM | POA: Diagnosis not present

## 2022-04-04 DIAGNOSIS — C8464 Anaplastic large cell lymphoma, ALK-positive, lymph nodes of axilla and upper limb: Secondary | ICD-10-CM

## 2022-04-04 DIAGNOSIS — N6322 Unspecified lump in the left breast, upper inner quadrant: Secondary | ICD-10-CM | POA: Diagnosis not present

## 2022-04-04 DIAGNOSIS — Z5111 Encounter for antineoplastic chemotherapy: Secondary | ICD-10-CM

## 2022-04-04 DIAGNOSIS — T451X5A Adverse effect of antineoplastic and immunosuppressive drugs, initial encounter: Secondary | ICD-10-CM

## 2022-04-04 HISTORY — PX: IR FL GUIDED LOC OF NEEDLE/CATH TIP FOR SPINAL INJECTION LT: IMG2396

## 2022-04-04 LAB — COMPREHENSIVE METABOLIC PANEL
ALT: 11 U/L (ref 0–44)
AST: 14 U/L — ABNORMAL LOW (ref 15–41)
Albumin: 3.9 g/dL (ref 3.5–5.0)
Alkaline Phosphatase: 64 U/L (ref 38–126)
Anion gap: 12 (ref 5–15)
BUN: 11 mg/dL (ref 6–20)
CO2: 19 mmol/L — ABNORMAL LOW (ref 22–32)
Calcium: 9.1 mg/dL (ref 8.9–10.3)
Chloride: 105 mmol/L (ref 98–111)
Creatinine, Ser: 0.75 mg/dL (ref 0.44–1.00)
GFR, Estimated: >60 mL/min (ref >60)
Glucose, Bld: 136 mg/dL — ABNORMAL HIGH (ref 70–99)
Potassium: 3.8 mmol/L (ref 3.5–5.1)
Sodium: 136 mmol/L (ref 135–145)
Total Bilirubin: 0.6 mg/dL (ref 0.3–1.2)
Total Protein: 7.8 g/dL (ref 6.5–8.1)

## 2022-04-04 LAB — URINALYSIS, COMPLETE (UACMP) WITH MICROSCOPIC
Bilirubin Urine: NEGATIVE
Glucose, UA: NEGATIVE mg/dL
Hgb urine dipstick: NEGATIVE
Ketones, ur: 20 mg/dL — AB
Leukocytes,Ua: NEGATIVE
Nitrite: NEGATIVE
Protein, ur: NEGATIVE mg/dL
Specific Gravity, Urine: 1.019 (ref 1.005–1.030)
pH: 5 (ref 5.0–8.0)

## 2022-04-04 LAB — CBC WITH DIFFERENTIAL/PLATELET
Abs Immature Granulocytes: 0.07 10*3/uL (ref 0.00–0.07)
Basophils Absolute: 0 10*3/uL (ref 0.0–0.1)
Basophils Relative: 0 %
Eosinophils Absolute: 0 10*3/uL (ref 0.0–0.5)
Eosinophils Relative: 0 %
HCT: 38.2 % (ref 36.0–46.0)
Hemoglobin: 12.7 g/dL (ref 12.0–15.0)
Immature Granulocytes: 1 %
Lymphocytes Relative: 7 %
Lymphs Abs: 0.9 10*3/uL (ref 0.7–4.0)
MCH: 29.7 pg (ref 26.0–34.0)
MCHC: 33.2 g/dL (ref 30.0–36.0)
MCV: 89.5 fL (ref 80.0–100.0)
Monocytes Absolute: 0.3 10*3/uL (ref 0.1–1.0)
Monocytes Relative: 2 %
Neutro Abs: 12 10*3/uL — ABNORMAL HIGH (ref 1.7–7.7)
Neutrophils Relative %: 90 %
Platelets: 315 10*3/uL (ref 150–400)
RBC: 4.27 MIL/uL (ref 3.87–5.11)
RDW: 12.5 % (ref 11.5–15.5)
WBC: 13.3 10*3/uL — ABNORMAL HIGH (ref 4.0–10.5)
nRBC: 0 % (ref 0.0–0.2)

## 2022-04-04 LAB — PREGNANCY, URINE: Preg Test, Ur: NEGATIVE

## 2022-04-04 MED ORDER — ACETAMINOPHEN 325 MG PO TABS: 650.0000 mg | ORAL_TABLET | ORAL | Status: DC | PRN

## 2022-04-04 MED ORDER — SODIUM CHLORIDE (PF) 0.9 % IJ SOLN
Freq: Once | INTRAMUSCULAR | Status: AC
  Filled 2022-04-04: qty 0.48

## 2022-04-04 MED ORDER — LIDOCAINE HCL (PF) 1 % IJ SOLN
INTRAMUSCULAR | Status: AC
  Filled 2022-04-04: qty 30

## 2022-04-04 MED FILL — Dexamethasone Sodium Phosphate Inj 100 MG/10ML: INTRAMUSCULAR | Qty: 1 | Status: AC

## 2022-04-04 NOTE — Progress Notes (Signed)
Patient request "ibuprofen" and took her own from home.  Patient states chronic pain remains 6/10.  Updated patient regarding order for lying flat x3 hours, then discharge.  Per patient request to call [facility] to update them that she will be late for lab work and meeting with MD.  I called and got the [place] Triage voice message and left name, patient name, two phone numbers to contact [facility] with questions, then updated patient.

## 2022-04-04 NOTE — Progress Notes (Signed)
Patient here for follow post intrathecal methotrexate injection. Pt very nauseous and vomiting. She took 1 dose of compazine in clinic, but vomited a few minutes later. Pt on Bactrim DS for breast biopsy site infection. Pt did not take antibiotic today.

## 2022-04-04 NOTE — Assessment & Plan Note (Deleted)
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 Lanier Ensign, he recommends salvage chemotherapy with ICE regimen.  In addition, also recommend patient to have intrathecal triple therapy with MTX 27m, Ara C 441mand hydrocortisone 100 mg Labs are reviewed and discussed with patient. Proceed with cycle 1

## 2022-04-04 NOTE — Assessment & Plan Note (Addendum)
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 CSF flowcytometry pending.  Recommend salvage therapy bridging to allogenic transplant Proceed with cycle 1 ICE with GCSF support. Day 1 intrathecal triple therapy with MTX 64m, Ara C 432mand hydrocortisone 100 mg Day 2 Ifosfamide, Etoposide, Mesna Day 3 Ifosfamide, Etoposide, Carboplatin, Mesna Day 4 Ifosfamide, Etoposide, Mesna Day 7 GCSF + IVF hydration.

## 2022-04-05 ENCOUNTER — Inpatient Hospital Stay: Payer: BC Managed Care – PPO

## 2022-04-05 ENCOUNTER — Encounter: Payer: Self-pay | Admitting: Oncology

## 2022-04-05 VITALS — BP 103/53 | HR 75 | Temp 97.3°F | Resp 20

## 2022-04-05 DIAGNOSIS — C8468 Anaplastic large cell lymphoma, ALK-positive, lymph nodes of multiple sites: Secondary | ICD-10-CM | POA: Diagnosis not present

## 2022-04-05 DIAGNOSIS — C8464 Anaplastic large cell lymphoma, ALK-positive, lymph nodes of axilla and upper limb: Secondary | ICD-10-CM

## 2022-04-05 MED ORDER — SODIUM CHLORIDE 0.9 % IV SOLN
100.0000 mg/m2 | Freq: Once | INTRAVENOUS | Status: AC
  Administered 2022-04-05: 180 mg via INTRAVENOUS
  Filled 2022-04-05: qty 9

## 2022-04-05 MED ORDER — SODIUM CHLORIDE 0.9 % IV SOLN
10.0000 mg | Freq: Once | INTRAVENOUS | Status: AC
  Administered 2022-04-05: 10 mg via INTRAVENOUS
  Filled 2022-04-05: qty 10

## 2022-04-05 MED ORDER — HYDROMORPHONE HCL 1 MG/ML IJ SOLN
0.2500 mg | INTRAMUSCULAR | Status: DC | PRN
  Administered 2022-04-05 (×2): 0.25 mg via INTRAVENOUS
  Filled 2022-04-05 (×2): qty 1

## 2022-04-05 MED ORDER — SODIUM CHLORIDE 0.9 % IV SOLN
Freq: Once | INTRAVENOUS | Status: AC
  Filled 2022-04-05: qty 250

## 2022-04-05 MED ORDER — SODIUM CHLORIDE 0.9 % IV SOLN
Freq: Once | INTRAVENOUS | Status: AC
  Filled 2022-04-05: qty 54

## 2022-04-05 MED ORDER — PALONOSETRON HCL INJECTION 0.25 MG/5ML
0.2500 mg | Freq: Once | INTRAVENOUS | Status: AC
  Administered 2022-04-05: 0.25 mg via INTRAVENOUS
  Filled 2022-04-05: qty 5

## 2022-04-05 MED ORDER — SODIUM CHLORIDE 0.9 % IV SOLN
400.0000 mg/m2 | Freq: Once | INTRAVENOUS | Status: AC
  Administered 2022-04-05: 700 mg via INTRAVENOUS
  Filled 2022-04-05: qty 7

## 2022-04-05 MED ORDER — LORAZEPAM 0.5 MG PO TABS: 0.5000 mg | ORAL_TABLET | Freq: Four times a day (QID) | ORAL | 0 refills | Status: DC | PRN

## 2022-04-05 NOTE — Patient Instructions (Signed)
[facility] AT [place]  Discharge Instructions: Thank you for choosing [facility] to provide your oncology and hematology care.  If you have a lab appointment with the [facility], please go directly to the [facility] and check in at the registration area.  Wear comfortable clothing and clothing appropriate for easy access to any Portacath or PICC line.   We strive to give you quality time with your provider. You may need to reschedule your appointment if you arrive late (15 or more minutes).  Arriving late affects you and other patients whose appointments are after yours.  Also, if you miss three or more appointments without notifying the office, you may be dismissed from the clinic at the provider's discretion.      For prescription refill requests, have your pharmacy contact our office and allow 72 hours for refills to be completed.    Today you received the following chemotherapy and/or immunotherapy agents ETOPOSIDE, MESNA, IFOSAMIDE      To help prevent nausea and vomiting after your treatment, we encourage you to take your nausea medication as directed.  BELOW ARE SYMPTOMS THAT SHOULD BE REPORTED IMMEDIATELY: *FEVER GREATER THAN 100.4 F (38 C) OR HIGHER *CHILLS OR SWEATING *NAUSEA AND VOMITING THAT IS NOT CONTROLLED WITH YOUR NAUSEA MEDICATION *UNUSUAL SHORTNESS OF BREATH *UNUSUAL BRUISING OR BLEEDING *URINARY PROBLEMS (pain or burning when urinating, or frequent urination) *BOWEL PROBLEMS (unusual diarrhea, constipation, pain near the anus) TENDERNESS IN MOUTH AND THROAT WITH OR WITHOUT PRESENCE OF ULCERS (sore throat, sores in mouth, or a toothache) UNUSUAL RASH, SWELLING OR PAIN  UNUSUAL VAGINAL DISCHARGE OR ITCHING   Items with * indicate a potential emergency and should be followed up as soon as possible or go to the Emergency Department if any problems should occur.  Please show the CHEMOTHERAPY ALERT CARD or IMMUNOTHERAPY ALERT  CARD at check-in to the Emergency Department and triage nurse.  Should you have questions after your visit or need to cancel or reschedule your appointment, please contact [facility] [place] [facility] [place]  [phone] and follow the prompts.  Office hours are 8:00 a.m. to 4:30 p.m. Monday - Friday. Please note that voicemails left after 4:00 p.m. may not be returned until the following business day.  We are closed weekends and major holidays. You have access to a nurse at all times for urgent questions. Please call the main number to the clinic [phone] and follow the prompts.  For any non-urgent questions, you may also contact your provider using MyChart. We now offer e-Visits for anyone 40 and older to request care online for non-urgent symptoms. For details visit [email or web address].   Also download the MyChart app! Go to the app store, search "MyChart", open the app, select [facility], and log in with your MyChart username and password.  Masks are optional in the cancer centers. If you would like for your care team to wear a mask while they are taking care of you, please let them know. For doctor visits, patients may have with them one support person who is at least 30 years old. At this time, visitors are not allowed in the infusion area.  Etoposide Injection What is this medication? ETOPOSIDE (e toe POE side) treats some types of cancer. It works by slowing down the growth of cancer cells. This medicine may be used for other purposes; ask your health care provider or pharmacist if you have questions. COMMON BRAND NAME(S): Etopophos, Toposar, VePesid What should I  tell my care team before I take this medication? They need to know if you have any of these conditions: Infection Kidney disease Liver disease Low blood counts, such as low white cell, platelet, red cell counts An unusual or allergic reaction to etoposide, other medications, foods, dyes, or preservatives If  you or your partner are pregnant or trying to get pregnant Breastfeeding How should I use this medication? This medication is injected into a vein. It is given by your care team in a hospital or clinic setting. Talk to your care team about the use of this medication in children. Special care may be needed. Overdosage: If you think you have taken too much of this medicine contact a poison control center or emergency room at once. NOTE: This medicine is only for you. Do not share this medicine with others. What if I miss a dose? Keep appointments for follow-up doses. It is important not to miss your dose. Call your care team if you are unable to keep an appointment. What may interact with this medication? Warfarin This list may not describe all possible interactions. Give your health care provider a list of all the medicines, herbs, non-prescription drugs, or dietary supplements you use. Also tell them if you smoke, drink alcohol, or use illegal drugs. Some items may interact with your medicine. What should I watch for while using this medication? Your condition will be monitored carefully while you are receiving this medication. This medication may make you feel generally unwell. This is not uncommon as chemotherapy can affect healthy cells as well as cancer cells. Report any side effects. Continue your course of treatment even though you feel ill unless your care team tells you to stop. This medication can cause serious side effects. To reduce the risk, your care team may give you other medications to take before receiving this one. Be sure to follow the directions from your care team. This medication may increase your risk of getting an infection. Call your care team for advice if you get a fever, chills, sore throat, or other symptoms of a cold or flu. Do not treat yourself. Try to avoid being around people who are sick. This medication may increase your risk to bruise or bleed. Call your care  team if you notice any unusual bleeding. Talk to your care team about your risk of cancer. You may be more at risk for certain types of cancers if you take this medication. Talk to your care team if you may be pregnant. Serious birth defects can occur if you take this medication during pregnancy and for 6 months after the last dose. You will need a negative pregnancy test before starting this medication. Contraception is recommended while taking this medication and for 6 months after the last dose. Your care team can help you find the option that works for you. If your partner can get pregnant, use a condom during sex while taking this medication and for 4 months after the last dose. Do not breastfeed while taking this medication. This medication may cause infertility. Talk to your care team if you are concerned about your fertility. What side effects may I notice from receiving this medication? Side effects that you should report to your care team as soon as possible: Allergic reactions--skin rash, itching, hives, swelling of the face, lips, tongue, or throat Infection--fever, chills, cough, sore throat, wounds that don't heal, pain or trouble when passing urine, general feeling of discomfort or being unwell Low red blood cell  level--unusual weakness or fatigue, dizziness, headache, trouble breathing Unusual bruising or bleeding Side effects that usually do not require medical attention (report to your care team if they continue or are bothersome): Diarrhea Fatigue Hair loss Loss of appetite Nausea Vomiting This list may not describe all possible side effects. Call your doctor for medical advice about side effects. You may report side effects to FDA at 1-800-FDA-1088. Where should I keep my medication? This medication is given in a hospital or clinic. It will not be stored at home. NOTE: This sheet is a summary. It may not cover all possible information. If you have questions about this  medicine, talk to your doctor, pharmacist, or health care provider.  2023 Elsevier/Gold Standard (2007-06-08 00:00:00)  Mesna Injection What is this medication? MESNA (MES na) reduces the risk of bleeding in the bladder caused by ifosfamide, a type of chemotherapy. It works by protecting your bladder from substances in the urine that may irritate it. This medicine may be used for other purposes; ask your health care provider or pharmacist if you have questions. COMMON BRAND NAME(S): Mesnex What should I tell my care team before I take this medication? They need to know if you have any of these conditions: An unusual or allergic reaction to mesna, other medications, foods, dyes, or preservatives Pregnant or trying to get pregnant Breast-feeding How should I use this medication? This medication is injected into a vein. It is given by your care team in a hospital or clinic setting. Talk to your care team about the use of this medication in children. Special care may be needed. Overdosage: If you think you have taken too much of this medicine contact a poison control center or emergency room at once. NOTE: This medicine is only for you. Do not share this medicine with others. What if I miss a dose? Keep appointments for follow-up doses. It is important not to miss your dose. Call your care team if you are unable to keep an appointment. What may interact with this medication? Interactions are not expected. This list may not describe all possible interactions. Give your health care provider a list of all the medicines, herbs, non-prescription drugs, or dietary supplements you use. Also tell them if you smoke, drink alcohol, or use illegal drugs. Some items may interact with your medicine. What should I watch for while using this medication? Your condition will be monitored carefully while you are receiving this medication. Tell your care team right away if you see that your urine has turned a  pink or red color. It is important to drink at least a quart (4 cups) of fluids each day that you take this medication. This medication may cause serious skin reactions. They can happen weeks to months after starting the medication. Contact your care team right away if you notice fevers or flu-like symptoms with a rash. The rash may be red or purple and then turn into blisters or peeling of the skin. You may also notice a red rash with swelling of the face, lips, or lymph nodes in your neck or under your arms. Talk to your care team if you or your partner may be pregnant. Serious birth defects can occur if you take this medication during pregnancy and for 6 months after the last dose. You will need a negative pregnancy test before starting this medication. Contraception is recommended while taking this medication and for 6 months after the last dose. Your care team can help you find the  option that works for you. If your partner can get pregnant, use a condom during sex while taking this medication and for 3 months after the last dose. Do not breastfeed while taking this medication and for 1 week after the last dose. What side effects may I notice from receiving this medication? Side effects that you should report to your care team as soon as possible: Allergic reactions or angioedema--skin rash, itching or hives, swelling of the face, eyes, lips, tongue, arms, or legs, trouble swallowing or breathing Redness, blistering, peeling, or loosening of the skin, including inside the mouth Side effects that usually do not require medical attention (report to your care team if they continue or are bothersome): Constipation Diarrhea Fatigue Hair loss Headache Nausea This list may not describe all possible side effects. Call your doctor for medical advice about side effects. You may report side effects to FDA at 1-800-FDA-1088. Where should I keep my medication? This medication is given in a hospital or  clinic. It will not be stored at home. NOTE: This sheet is a summary. It may not cover all possible information. If you have questions about this medicine, talk to your doctor, pharmacist, or health care provider.  2023 Elsevier/Gold Standard (2021-08-26 00:00:00)  Ifosfamide Injection What is this medication? IFOSFAMIDE (eye FOS fa mide) treats some types of cancer. It works by slowing down the growth of cancer cells. This medicine may be used for other purposes; ask your health care provider or pharmacist if you have questions. COMMON BRAND NAME(S): Ifex, Ifex/Mesna What should I tell my care team before I take this medication? They need to know if you have any of these conditions: Heart disease History of irregular heartbeat Immune system problems Infection, such as chickenpox, cold sores, or herpes Infections in the bladder, kidneys, or urinary tract Kidney disease Low blood cell levels, such as white cells, platelets, or red blood cells Lung or breathing disease, such as asthma or COPD Problems urinating Recent or ongoing radiation therapy Tingling of the fingers or toes or other nerve disorder An unusual or allergic reaction to ifosfamide, other medications, foods, dyes, or preservatives Pregnant or trying to get pregnant Breast-feeding How should I use this medication? This medication is injected into a vein. It is given by your care team in a hospital or clinic setting. Talk to your care team about the use of this medication in children. Special care may be needed. Overdosage: If you think you have taken too much of this medicine contact a poison control center or emergency room at once. NOTE: This medicine is only for you. Do not share this medicine with others. What if I miss a dose? Keep appointments for follow-up doses. It is important not to miss your dose. Call your care team if you are unable to keep an appointment. What may interact with this medication? Do not take  this medication with any of the following: Live virus vaccines This medication may interact with the following: Aprepitant, fosaprepitant Certain medications for fungal infections, such as fluconazole, itraconazole, and ketoconazole Certain medications for seizures, such as carbamazepine, phenobarbital, phenytoin Grapefruit, grapefruit juice Rifampin St. John's wort This list may not describe all possible interactions. Give your health care provider a list of all the medicines, herbs, non-prescription drugs, or dietary supplements you use. Also tell them if you smoke, drink alcohol, or use illegal drugs. Some items may interact with your medicine. What should I watch for while using this medication? Visit your care team for  checks on your progress. This medication may make you feel generally unwell. This is not uncommon, as chemotherapy can affect healthy cells as well as cancer cells. Report any side effects. Continue your course of treatment even though you feel ill unless your care team tells you to stop. Drink water or other fluids as directed. Urinate often, even at night. In some cases, you may be given additional medications to help with side effects. Follow all directions for their use. This medication may increase your risk of getting an infection. Call your care team for advice if you get a fever, chills, sore throat, or other symptoms of a cold or flu. Do not treat yourself. Try to avoid being around people who are sick. This medication may increase your risk to bruise or bleed. Call your care team if you notice any unusual bleeding. Be careful brushing and flossing your teeth or using a toothpick because you may get an infection or bleed more easily. If you have any dental work done, tell your dentist you are receiving this medication. Avoid taking medications that contain aspirin, acetaminophen, ibuprofen, naproxen, or ketoprofen unless instructed by your care team. These medications  may hide a fever. Talk to your care team if you or your partner wish to become pregnant. This medication can cause serious birth defects if taken during pregnancy and for 6 months after stopping treatment. Do not father a child while taking this medication and for 6 months after stopping treatment. A reliable form of contraception is recommended while taking this medication and for 6 months after stopping treatment. Talk to your care team about reliable forms of contraception. Do not breast-feed while taking this medication. This medication may cause infertility. Talk to your care team if you are concerned about your fertility. What side effects may I notice from receiving this medication? Side effects that you should report to your care team as soon as possible: Allergic reactions--skin rash, itching, hives, swelling of the face, lips, tongue, or throat Dry cough, shortness of breath or trouble breathing Hallucinations Heart failure--shortness of breath, swelling of the ankles, feet, or hands, sudden weight gain, unusual weakness or fatigue Heart rhythm changes--fast or irregular heartbeat, dizziness, feeling faint or lightheaded, chest pain, trouble breathing Infection--fever, chills, cough, sore throat, wounds that don't heal, pain or trouble when passing urine, general feeling of discomfort or being unwell Kidney injury--decrease in the amount of urine, swelling of the ankles, hands, or feet Liver injury--right upper belly pain, loss of appetite, nausea, light-colored stool, dark yellow or brown urine, yellowing skin or eyes, unusual weakness or fatigue Low red blood cell level--unusual weakness or fatigue, dizziness, headache, trouble breathing Pain, tingling, or numbness in the hands or feet, muscle weakness, change in vision, confusion or trouble speaking, loss of balance or coordination, trouble walking, seizures Red or dark brown urine Unusual bruising or bleeding Unusual changes in  behavior Side effects that usually do not require medical attention (report to your care team if they continue or are bothersome): Hair loss Nausea Vomiting This list may not describe all possible side effects. Call your doctor for medical advice about side effects. You may report side effects to FDA at 1-800-FDA-1088. Where should I keep my medication? This medication is given in a hospital or clinic. It will not be stored at home. NOTE: This sheet is a summary. It may not cover all possible information. If you have questions about this medicine, talk to your doctor, pharmacist, or health care provider.  2023 Elsevier/Gold Standard (2021-07-21 00:00:00)

## 2022-04-06 ENCOUNTER — Encounter: Payer: Self-pay | Admitting: Oncology

## 2022-04-06 ENCOUNTER — Inpatient Hospital Stay: Payer: BC Managed Care – PPO

## 2022-04-06 VITALS — BP 107/57 | HR 72 | Temp 98.2°F | Resp 16

## 2022-04-06 DIAGNOSIS — C8464 Anaplastic large cell lymphoma, ALK-positive, lymph nodes of axilla and upper limb: Secondary | ICD-10-CM

## 2022-04-06 DIAGNOSIS — C8468 Anaplastic large cell lymphoma, ALK-positive, lymph nodes of multiple sites: Secondary | ICD-10-CM | POA: Diagnosis not present

## 2022-04-06 LAB — CBC WITH DIFFERENTIAL/PLATELET
Abs Immature Granulocytes: 0.03 10*3/uL (ref 0.00–0.07)
Basophils Absolute: 0 10*3/uL (ref 0.0–0.1)
Basophils Relative: 0 %
Eosinophils Absolute: 0 10*3/uL (ref 0.0–0.5)
Eosinophils Relative: 0 %
HCT: 35.6 % — ABNORMAL LOW (ref 36.0–46.0)
Hemoglobin: 12 g/dL (ref 12.0–15.0)
Immature Granulocytes: 0 %
Lymphocytes Relative: 18 %
Lymphs Abs: 1.8 10*3/uL (ref 0.7–4.0)
MCH: 30.2 pg (ref 26.0–34.0)
MCHC: 33.7 g/dL (ref 30.0–36.0)
MCV: 89.7 fL (ref 80.0–100.0)
Monocytes Absolute: 0.5 10*3/uL (ref 0.1–1.0)
Monocytes Relative: 5 %
Neutro Abs: 7.8 10*3/uL — ABNORMAL HIGH (ref 1.7–7.7)
Neutrophils Relative %: 77 %
Platelets: 306 10*3/uL (ref 150–400)
RBC: 3.97 MIL/uL (ref 3.87–5.11)
RDW: 12.4 % (ref 11.5–15.5)
WBC: 10.1 10*3/uL (ref 4.0–10.5)
nRBC: 0 % (ref 0.0–0.2)

## 2022-04-06 LAB — URINALYSIS, COMPLETE (UACMP) WITH MICROSCOPIC
Bilirubin Urine: NEGATIVE
Glucose, UA: 50 mg/dL — AB
Ketones, ur: NEGATIVE mg/dL
Leukocytes,Ua: NEGATIVE
Nitrite: NEGATIVE
Protein, ur: 100 mg/dL — AB
Specific Gravity, Urine: 1.025 (ref 1.005–1.030)
pH: 5 (ref 5.0–8.0)

## 2022-04-06 LAB — COMP PANEL: LEUKEMIA/LYMPHOMA

## 2022-04-06 MED ORDER — HYDROMORPHONE HCL 1 MG/ML IJ SOLN
0.2500 mg | INTRAMUSCULAR | Status: DC | PRN
  Administered 2022-04-06 (×3): 0.25 mg via INTRAVENOUS
  Filled 2022-04-06 (×3): qty 1

## 2022-04-06 MED ORDER — SODIUM CHLORIDE 0.9 % IV SOLN
400.0000 mg/m2 | Freq: Once | INTRAVENOUS | Status: AC
  Administered 2022-04-06: 700 mg via INTRAVENOUS
  Filled 2022-04-06: qty 7

## 2022-04-06 MED ORDER — SODIUM CHLORIDE 0.9 % IV SOLN
Freq: Once | INTRAVENOUS | Status: AC
  Filled 2022-04-06: qty 54

## 2022-04-06 MED ORDER — SODIUM CHLORIDE 0.9 % IV SOLN
Freq: Once | INTRAVENOUS | Status: AC
  Filled 2022-04-06: qty 250

## 2022-04-06 MED ORDER — SODIUM CHLORIDE 0.9 % IV SOLN
150.0000 mg | Freq: Once | INTRAVENOUS | Status: AC
  Administered 2022-04-06: 150 mg via INTRAVENOUS
  Filled 2022-04-06: qty 150

## 2022-04-06 MED ORDER — SODIUM CHLORIDE 0.9 % IV SOLN
10.0000 mg | Freq: Once | INTRAVENOUS | Status: AC
  Administered 2022-04-06: 10 mg via INTRAVENOUS
  Filled 2022-04-06: qty 10

## 2022-04-06 MED ORDER — SODIUM CHLORIDE 0.9 % IV SOLN
724.0000 mg | Freq: Once | INTRAVENOUS | Status: AC
  Administered 2022-04-06: 720 mg via INTRAVENOUS
  Filled 2022-04-06: qty 72

## 2022-04-06 MED ORDER — SODIUM CHLORIDE 0.9 % IV SOLN
100.0000 mg/m2 | Freq: Once | INTRAVENOUS | Status: AC
  Administered 2022-04-06: 180 mg via INTRAVENOUS
  Filled 2022-04-06: qty 9

## 2022-04-06 MED FILL — Dexamethasone Sodium Phosphate Inj 100 MG/10ML: INTRAMUSCULAR | Qty: 1 | Status: AC

## 2022-04-06 NOTE — Patient Instructions (Signed)
[facility] AT [place]  Discharge Instructions: Thank you for choosing [facility] to provide your oncology and hematology care.  If you have a lab appointment with the [facility], please go directly to the [facility] and check in at the registration area.  Wear comfortable clothing and clothing appropriate for easy access to any Portacath or PICC line.   We strive to give you quality time with your provider. You may need to reschedule your appointment if you arrive late (15 or more minutes).  Arriving late affects you and other patients whose appointments are after yours.  Also, if you miss three or more appointments without notifying the office, you may be dismissed from the clinic at the provider's discretion.      For prescription refill requests, have your pharmacy contact our office and allow 72 hours for refills to be completed.    Today you received the following chemotherapy and/or immunotherapy agents ETOPOSIDE, MESNA, IFOSAMIDE, CARBOPLATIN   To help prevent nausea and vomiting after your treatment, we encourage you to take your nausea medication as directed.  BELOW ARE SYMPTOMS THAT SHOULD BE REPORTED IMMEDIATELY: *FEVER GREATER THAN 100.4 F (38 C) OR HIGHER *CHILLS OR SWEATING *NAUSEA AND VOMITING THAT IS NOT CONTROLLED WITH YOUR NAUSEA MEDICATION *UNUSUAL SHORTNESS OF BREATH *UNUSUAL BRUISING OR BLEEDING *URINARY PROBLEMS (pain or burning when urinating, or frequent urination) *BOWEL PROBLEMS (unusual diarrhea, constipation, pain near the anus) TENDERNESS IN MOUTH AND THROAT WITH OR WITHOUT PRESENCE OF ULCERS (sore throat, sores in mouth, or a toothache) UNUSUAL RASH, SWELLING OR PAIN  UNUSUAL VAGINAL DISCHARGE OR ITCHING   Items with * indicate a potential emergency and should be followed up as soon as possible or go to the Emergency Department if any problems should occur.  Please show the CHEMOTHERAPY ALERT CARD or IMMUNOTHERAPY  ALERT CARD at check-in to the Emergency Department and triage nurse.  Should you have questions after your visit or need to cancel or reschedule your appointment, please contact [facility] [place] AT [place]  [phone] and follow the prompts.  Office hours are 8:00 a.m. to 4:30 p.m. Monday - Friday. Please note that voicemails left after 4:00 p.m. may not be returned until the following business day.  We are closed weekends and major holidays. You have access to a nurse at all times for urgent questions. Please call the main number to the clinic [phone] and follow the prompts.  For any non-urgent questions, you may also contact your provider using MyChart. We now offer e-Visits for anyone 7 and older to request care online for non-urgent symptoms. For details visit [email or web address].   Also download the MyChart app! Go to the app store, search "MyChart", open the app, select [place], and log in with your MyChart username and password.  Masks are optional in the cancer centers. If you would like for your care team to wear a mask while they are taking care of you, please let them know. For doctor visits, patients may have with them one support person who is at least 30 years old. At this time, visitors are not allowed in the infusion area.  Etoposide Injection What is this medication? ETOPOSIDE (e toe POE side) treats some types of cancer. It works by slowing down the growth of cancer cells. This medicine may be used for other purposes; ask your health care provider or pharmacist if you have questions. COMMON BRAND NAME(S): Etopophos, Toposar, VePesid What should I tell my  care team before I take this medication? They need to know if you have any of these conditions: Infection Kidney disease Liver disease Low blood counts, such as low white cell, platelet, red cell counts An unusual or allergic reaction to etoposide, other medications, foods, dyes, or  preservatives If you or your partner are pregnant or trying to get pregnant Breastfeeding How should I use this medication? This medication is injected into a vein. It is given by your care team in a hospital or clinic setting. Talk to your care team about the use of this medication in children. Special care may be needed. Overdosage: If you think you have taken too much of this medicine contact a poison control center or emergency room at once. NOTE: This medicine is only for you. Do not share this medicine with others. What if I miss a dose? Keep appointments for follow-up doses. It is important not to miss your dose. Call your care team if you are unable to keep an appointment. What may interact with this medication? Warfarin This list may not describe all possible interactions. Give your health care provider a list of all the medicines, herbs, non-prescription drugs, or dietary supplements you use. Also tell them if you smoke, drink alcohol, or use illegal drugs. Some items may interact with your medicine. What should I watch for while using this medication? Your condition will be monitored carefully while you are receiving this medication. This medication may make you feel generally unwell. This is not uncommon as chemotherapy can affect healthy cells as well as cancer cells. Report any side effects. Continue your course of treatment even though you feel ill unless your care team tells you to stop. This medication can cause serious side effects. To reduce the risk, your care team may give you other medications to take before receiving this one. Be sure to follow the directions from your care team. This medication may increase your risk of getting an infection. Call your care team for advice if you get a fever, chills, sore throat, or other symptoms of a cold or flu. Do not treat yourself. Try to avoid being around people who are sick. This medication may increase your risk to bruise or bleed.  Call your care team if you notice any unusual bleeding. Talk to your care team about your risk of cancer. You may be more at risk for certain types of cancers if you take this medication. Talk to your care team if you may be pregnant. Serious birth defects can occur if you take this medication during pregnancy and for 6 months after the last dose. You will need a negative pregnancy test before starting this medication. Contraception is recommended while taking this medication and for 6 months after the last dose. Your care team can help you find the option that works for you. If your partner can get pregnant, use a condom during sex while taking this medication and for 4 months after the last dose. Do not breastfeed while taking this medication. This medication may cause infertility. Talk to your care team if you are concerned about your fertility. What side effects may I notice from receiving this medication? Side effects that you should report to your care team as soon as possible: Allergic reactions--skin rash, itching, hives, swelling of the face, lips, tongue, or throat Infection--fever, chills, cough, sore throat, wounds that don't heal, pain or trouble when passing urine, general feeling of discomfort or being unwell Low red blood cell level--unusual weakness  or fatigue, dizziness, headache, trouble breathing Unusual bruising or bleeding Side effects that usually do not require medical attention (report to your care team if they continue or are bothersome): Diarrhea Fatigue Hair loss Loss of appetite Nausea Vomiting This list may not describe all possible side effects. Call your doctor for medical advice about side effects. You may report side effects to FDA at 1-800-FDA-1088. Where should I keep my medication? This medication is given in a hospital or clinic. It will not be stored at home. NOTE: This sheet is a summary. It may not cover all possible information. If you have questions  about this medicine, talk to your doctor, pharmacist, or health care provider.  2023 Elsevier/Gold Standard (2007-06-08 00:00:00)  Mesna Injection What is this medication? MESNA (MES na) reduces the risk of bleeding in the bladder caused by ifosfamide, a type of chemotherapy. It works by protecting your bladder from substances in the urine that may irritate it. This medicine may be used for other purposes; ask your health care provider or pharmacist if you have questions. COMMON BRAND NAME(S): Mesnex What should I tell my care team before I take this medication? They need to know if you have any of these conditions: An unusual or allergic reaction to mesna, other medications, foods, dyes, or preservatives Pregnant or trying to get pregnant Breast-feeding How should I use this medication? This medication is injected into a vein. It is given by your care team in a hospital or clinic setting. Talk to your care team about the use of this medication in children. Special care may be needed. Overdosage: If you think you have taken too much of this medicine contact a poison control center or emergency room at once. NOTE: This medicine is only for you. Do not share this medicine with others. What if I miss a dose? Keep appointments for follow-up doses. It is important not to miss your dose. Call your care team if you are unable to keep an appointment. What may interact with this medication? Interactions are not expected. This list may not describe all possible interactions. Give your health care provider a list of all the medicines, herbs, non-prescription drugs, or dietary supplements you use. Also tell them if you smoke, drink alcohol, or use illegal drugs. Some items may interact with your medicine. What should I watch for while using this medication? Your condition will be monitored carefully while you are receiving this medication. Tell your care team right away if you see that your urine has  turned a pink or red color. It is important to drink at least a quart (4 cups) of fluids each day that you take this medication. This medication may cause serious skin reactions. They can happen weeks to months after starting the medication. Contact your care team right away if you notice fevers or flu-like symptoms with a rash. The rash may be red or purple and then turn into blisters or peeling of the skin. You may also notice a red rash with swelling of the face, lips, or lymph nodes in your neck or under your arms. Talk to your care team if you or your partner may be pregnant. Serious birth defects can occur if you take this medication during pregnancy and for 6 months after the last dose. You will need a negative pregnancy test before starting this medication. Contraception is recommended while taking this medication and for 6 months after the last dose. Your care team can help you find the option that  works for you. If your partner can get pregnant, use a condom during sex while taking this medication and for 3 months after the last dose. Do not breastfeed while taking this medication and for 1 week after the last dose. What side effects may I notice from receiving this medication? Side effects that you should report to your care team as soon as possible: Allergic reactions or angioedema--skin rash, itching or hives, swelling of the face, eyes, lips, tongue, arms, or legs, trouble swallowing or breathing Redness, blistering, peeling, or loosening of the skin, including inside the mouth Side effects that usually do not require medical attention (report to your care team if they continue or are bothersome): Constipation Diarrhea Fatigue Hair loss Headache Nausea This list may not describe all possible side effects. Call your doctor for medical advice about side effects. You may report side effects to FDA at 1-800-FDA-1088. Where should I keep my medication? This medication is given in a  hospital or clinic. It will not be stored at home. NOTE: This sheet is a summary. It may not cover all possible information. If you have questions about this medicine, talk to your doctor, pharmacist, or health care provider.  2023 Elsevier/Gold Standard (2021-08-26 00:00:00)  Ifosfamide Injection What is this medication? IFOSFAMIDE (eye FOS fa mide) treats some types of cancer. It works by slowing down the growth of cancer cells. This medicine may be used for other purposes; ask your health care provider or pharmacist if you have questions. COMMON BRAND NAME(S): Ifex, Ifex/Mesna What should I tell my care team before I take this medication? They need to know if you have any of these conditions: Heart disease History of irregular heartbeat Immune system problems Infection, such as chickenpox, cold sores, or herpes Infections in the bladder, kidneys, or urinary tract Kidney disease Low blood cell levels, such as white cells, platelets, or red blood cells Lung or breathing disease, such as asthma or COPD Problems urinating Recent or ongoing radiation therapy Tingling of the fingers or toes or other nerve disorder An unusual or allergic reaction to ifosfamide, other medications, foods, dyes, or preservatives Pregnant or trying to get pregnant Breast-feeding How should I use this medication? This medication is injected into a vein. It is given by your care team in a hospital or clinic setting. Talk to your care team about the use of this medication in children. Special care may be needed. Overdosage: If you think you have taken too much of this medicine contact a poison control center or emergency room at once. NOTE: This medicine is only for you. Do not share this medicine with others. What if I miss a dose? Keep appointments for follow-up doses. It is important not to miss your dose. Call your care team if you are unable to keep an appointment. What may interact with this  medication? Do not take this medication with any of the following: Live virus vaccines This medication may interact with the following: Aprepitant, fosaprepitant Certain medications for fungal infections, such as fluconazole, itraconazole, and ketoconazole Certain medications for seizures, such as carbamazepine, phenobarbital, phenytoin Grapefruit, grapefruit juice Rifampin St. John's wort This list may not describe all possible interactions. Give your health care provider a list of all the medicines, herbs, non-prescription drugs, or dietary supplements you use. Also tell them if you smoke, drink alcohol, or use illegal drugs. Some items may interact with your medicine. What should I watch for while using this medication? Visit your care team for checks on  your progress. This medication may make you feel generally unwell. This is not uncommon, as chemotherapy can affect healthy cells as well as cancer cells. Report any side effects. Continue your course of treatment even though you feel ill unless your care team tells you to stop. Drink water or other fluids as directed. Urinate often, even at night. In some cases, you may be given additional medications to help with side effects. Follow all directions for their use. This medication may increase your risk of getting an infection. Call your care team for advice if you get a fever, chills, sore throat, or other symptoms of a cold or flu. Do not treat yourself. Try to avoid being around people who are sick. This medication may increase your risk to bruise or bleed. Call your care team if you notice any unusual bleeding. Be careful brushing and flossing your teeth or using a toothpick because you may get an infection or bleed more easily. If you have any dental work done, tell your dentist you are receiving this medication. Avoid taking medications that contain aspirin, acetaminophen, ibuprofen, naproxen, or ketoprofen unless instructed by your care  team. These medications may hide a fever. Talk to your care team if you or your partner wish to become pregnant. This medication can cause serious birth defects if taken during pregnancy and for 6 months after stopping treatment. Do not father a child while taking this medication and for 6 months after stopping treatment. A reliable form of contraception is recommended while taking this medication and for 6 months after stopping treatment. Talk to your care team about reliable forms of contraception. Do not breast-feed while taking this medication. This medication may cause infertility. Talk to your care team if you are concerned about your fertility. What side effects may I notice from receiving this medication? Side effects that you should report to your care team as soon as possible: Allergic reactions--skin rash, itching, hives, swelling of the face, lips, tongue, or throat Dry cough, shortness of breath or trouble breathing Hallucinations Heart failure--shortness of breath, swelling of the ankles, feet, or hands, sudden weight gain, unusual weakness or fatigue Heart rhythm changes--fast or irregular heartbeat, dizziness, feeling faint or lightheaded, chest pain, trouble breathing Infection--fever, chills, cough, sore throat, wounds that don't heal, pain or trouble when passing urine, general feeling of discomfort or being unwell Kidney injury--decrease in the amount of urine, swelling of the ankles, hands, or feet Liver injury--right upper belly pain, loss of appetite, nausea, light-colored stool, dark yellow or brown urine, yellowing skin or eyes, unusual weakness or fatigue Low red blood cell level--unusual weakness or fatigue, dizziness, headache, trouble breathing Pain, tingling, or numbness in the hands or feet, muscle weakness, change in vision, confusion or trouble speaking, loss of balance or coordination, trouble walking, seizures Red or dark brown urine Unusual bruising or  bleeding Unusual changes in behavior Side effects that usually do not require medical attention (report to your care team if they continue or are bothersome): Hair loss Nausea Vomiting This list may not describe all possible side effects. Call your doctor for medical advice about side effects. You may report side effects to FDA at 1-800-FDA-1088. Where should I keep my medication? This medication is given in a hospital or clinic. It will not be stored at home. NOTE: This sheet is a summary. It may not cover all possible information. If you have questions about this medicine, talk to your doctor, pharmacist, or health care provider.  2023 Elsevier/Gold  Standard (2021-07-21 00:00:00)

## 2022-04-07 ENCOUNTER — Inpatient Hospital Stay: Payer: BC Managed Care – PPO

## 2022-04-07 ENCOUNTER — Ambulatory Visit
Admission: RE | Admit: 2022-04-07 | Discharge: 2022-04-07 | Disposition: A | Discharge status: Home / Self Care | Payer: BC Managed Care – PPO | Source: Ambulatory Visit | Attending: Hospice and Palliative Medicine | Admitting: Hospice and Palliative Medicine

## 2022-04-07 ENCOUNTER — Other Ambulatory Visit: Payer: Self-pay

## 2022-04-07 ENCOUNTER — Other Ambulatory Visit: Payer: Self-pay | Admitting: Hospice and Palliative Medicine

## 2022-04-07 ENCOUNTER — Inpatient Hospital Stay (HOSPITAL_BASED_OUTPATIENT_CLINIC_OR_DEPARTMENT_OTHER): Payer: BC Managed Care – PPO | Admitting: Hospice and Palliative Medicine

## 2022-04-07 VITALS — BP 113/70 | HR 75 | Temp 96.8°F | Resp 20

## 2022-04-07 DIAGNOSIS — C8464 Anaplastic large cell lymphoma, ALK-positive, lymph nodes of axilla and upper limb: Secondary | ICD-10-CM | POA: Insufficient documentation

## 2022-04-07 DIAGNOSIS — R519 Headache, unspecified: Secondary | ICD-10-CM | POA: Diagnosis not present

## 2022-04-07 DIAGNOSIS — M542 Cervicalgia: Secondary | ICD-10-CM

## 2022-04-07 DIAGNOSIS — C8468 Anaplastic large cell lymphoma, ALK-positive, lymph nodes of multiple sites: Secondary | ICD-10-CM | POA: Diagnosis not present

## 2022-04-07 HISTORY — PX: IR FLUORO GUIDED NEEDLE PLC ASPIRATION/INJECTION LOC: IMG2395

## 2022-04-07 LAB — URINALYSIS, COMPLETE (UACMP) WITH MICROSCOPIC
Bilirubin Urine: NEGATIVE
Glucose, UA: 50 mg/dL — AB
Ketones, ur: NEGATIVE mg/dL
Leukocytes,Ua: NEGATIVE
Nitrite: NEGATIVE
Protein, ur: NEGATIVE mg/dL
Specific Gravity, Urine: 1.012 (ref 1.005–1.030)
pH: 6 (ref 5.0–8.0)

## 2022-04-07 LAB — CBC WITH DIFFERENTIAL/PLATELET
Abs Immature Granulocytes: 0.05 10*3/uL (ref 0.00–0.07)
Basophils Absolute: 0 10*3/uL (ref 0.0–0.1)
Basophils Relative: 0 %
Eosinophils Absolute: 0 10*3/uL (ref 0.0–0.5)
Eosinophils Relative: 0 %
HCT: 35.8 % — ABNORMAL LOW (ref 36.0–46.0)
Hemoglobin: 11.7 g/dL — ABNORMAL LOW (ref 12.0–15.0)
Immature Granulocytes: 1 %
Lymphocytes Relative: 19 %
Lymphs Abs: 1.7 10*3/uL (ref 0.7–4.0)
MCH: 29.5 pg (ref 26.0–34.0)
MCHC: 32.7 g/dL (ref 30.0–36.0)
MCV: 90.4 fL (ref 80.0–100.0)
Monocytes Absolute: 0.3 10*3/uL (ref 0.1–1.0)
Monocytes Relative: 3 %
Neutro Abs: 7.2 10*3/uL (ref 1.7–7.7)
Neutrophils Relative %: 77 %
Platelets: 291 10*3/uL (ref 150–400)
RBC: 3.96 MIL/uL (ref 3.87–5.11)
RDW: 12.5 % (ref 11.5–15.5)
WBC: 9.3 10*3/uL (ref 4.0–10.5)
nRBC: 0 % (ref 0.0–0.2)

## 2022-04-07 LAB — URINE CULTURE: Culture: NO GROWTH

## 2022-04-07 MED ORDER — HYDROMORPHONE HCL 1 MG/ML IJ SOLN
0.2500 mg | INTRAMUSCULAR | Status: DC | PRN
  Administered 2022-04-07 (×2): 0.25 mg via INTRAVENOUS
  Filled 2022-04-07 (×2): qty 1

## 2022-04-07 MED ORDER — SODIUM CHLORIDE 0.9 % IV SOLN
Freq: Once | INTRAVENOUS | Status: AC
  Filled 2022-04-07: qty 54

## 2022-04-07 MED ORDER — OXYCODONE HCL 5 MG PO TABS: 5.0000 mg | ORAL_TABLET | Freq: Four times a day (QID) | ORAL | 0 refills | Status: DC | PRN

## 2022-04-07 MED ORDER — NALOXONE HCL 4 MG/0.1ML NA LIQD: NASAL | 0 refills | Status: AC

## 2022-04-07 MED ORDER — IOHEXOL 180 MG/ML  SOLN
1.0000 mL | Freq: Once | INTRAMUSCULAR | Status: AC | PRN
  Administered 2022-04-07: 1 mL via INTRATHECAL

## 2022-04-07 MED ORDER — SODIUM CHLORIDE 0.9 % IV SOLN
10.0000 mg | Freq: Once | INTRAVENOUS | Status: AC
  Administered 2022-04-07: 10 mg via INTRAVENOUS
  Filled 2022-04-07: qty 10

## 2022-04-07 MED ORDER — SODIUM CHLORIDE (PF) 0.9 % IJ SOLN
INTRAMUSCULAR | Status: AC
  Administered 2022-04-07: 5 mL
  Filled 2022-04-07: qty 10

## 2022-04-07 MED ORDER — LIDOCAINE HCL (PF) 1 % IJ SOLN
INTRAMUSCULAR | Status: AC
  Administered 2022-04-07: 4 mL
  Filled 2022-04-07: qty 30

## 2022-04-07 MED ORDER — SODIUM CHLORIDE 0.9 % IV SOLN
100.0000 mg/m2 | Freq: Once | INTRAVENOUS | Status: AC
  Administered 2022-04-07: 180 mg via INTRAVENOUS
  Filled 2022-04-07: qty 9

## 2022-04-07 MED ORDER — SODIUM CHLORIDE 0.9 % IV SOLN
400.0000 mg/m2 | Freq: Once | INTRAVENOUS | Status: AC
  Administered 2022-04-07: 700 mg via INTRAVENOUS
  Filled 2022-04-07: qty 7

## 2022-04-07 MED ORDER — SODIUM CHLORIDE 0.9 % IV SOLN
Freq: Once | INTRAVENOUS | Status: AC
  Filled 2022-04-07: qty 250

## 2022-04-07 MED ORDER — PALONOSETRON HCL INJECTION 0.25 MG/5ML
0.2500 mg | Freq: Once | INTRAVENOUS | Status: AC
  Administered 2022-04-07: 0.25 mg via INTRAVENOUS

## 2022-04-07 NOTE — Progress Notes (Signed)
Symptom Management [place] at [facility] [phone] [phone]  Patient Care Team: Patient, No Pcp Per as PCP - General (General Practice) Earlie Server, MD as Consulting Physician (Oncology)   NAME OF PATIENT: Angel Tate  [id number]  1990/08/21   DATE OF VISIT: 04/07/22  REASON FOR CONSULT: Angel Tate is a 31 y.o. 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  Patient is on salvage chemotherapy with ICE regimen.  She also initiated intrathecal triple therapy with MTX, Ara C, and hydrocortisone.  INTERVAL HISTORY: Patient underwent LP with intrathecal chemotherapy earlier this week.  She has subsequently had persistent headache, which she describes as being worse when she stands and lessens significantly when she is lying flat.  She has been taking Tylenol without significant improvement in symptoms.  She also has tried intermittent oxycodone but does not find that to be particularly helpful either.  Patient denies other neurological symptoms including weakness, dizziness, visual changes.  No fever or chills.  Denies recent fevers or illnesses. Denies any easy bleeding or bruising. Reports good appetite and denies weight loss. Denies chest pain. Denies any nausea, vomiting, constipation, or diarrhea. Denies urinary complaints. Patient offers no further specific complaints today.   SOCIAL HISTORY: Patient is married lives at home with her husband and 2 young children.  She has good social support from parents.  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   PAST MEDICAL HISTORY: Past Medical History:  Diagnosis Date   Anxiety    Depression    Medical history non-contributory     PAST SURGICAL HISTORY:  Past Surgical History:  Procedure Laterality Date   BREAST BIOPSY Left 03/06/2022   [place] LT BREAST  BX W LOC DEV 1ST LESION IMG BX SPEC US GUIDE 03/06/2022 [facility] MAMMOGRAPHY   DILATION AND CURETTAGE OF UTERUS     DILATION AND EVACUATION N/A 02/17/2017   Procedure: DILATATION AND EVACUATION;  Surgeon: Janyth Pupa, DO;  Location: [facility];  Service: Gynecology;  Laterality: N/A;   IR FL GUIDED LOC OF NEEDLE/CATH TIP FOR SPINAL INJECTION LT  04/04/2022   PORTA CATH INSERTION N/A 02/23/2020   Procedure: PORTA CATH INSERTION;  Surgeon: Algernon Huxley, MD;  Location: [facility];  Service: Cardiovascular;  Laterality: N/A;   PORTA CATH REMOVAL N/A 09/13/2020   Procedure: PORTA CATH REMOVAL;  Surgeon: Algernon Huxley, MD;  Location: [facility];  Service: Cardiovascular;  Laterality: N/A;    HEMATOLOGY/ONCOLOGY HISTORY:  Oncology History  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  Initially the mass did not bother her.  Patient developed a rash in the right axillary/upper outer quadrant of the breast and had additional work-up.  Patient was treated with a 10-day course of doxycycline for possible infection.  Did not improve.  #02/04/2020, targeted ultrasound showed right axillary mass measuring 6.2 x 4 by 4 cm.  There are 2 adjacent smaller axillary mass measuring 2.0 x 1.2 x 1.4 cm and 0.8 x  0.7 x 0.9 cm.  Mammogram showed dense fibroglandular.  No suspicious mass or malignant type microcalcifications or distortion detected in either breast.  # Patient underwent ultrasound-guided core biopsy of the right axillary mass. Lymph node biopsy showed anaplastic large cell lymphoma, ALK  positive.  Ki-67 80-90%, Flow cytometry has insufficient cells for analysis.    02/17/2020 Bone Marrow Biopsy   Bone marrow biopsy is negative   02/18/2020 Echocardiogram   Echocardiogram showed normal LVEF   03/02/2020 Imaging   PET scan showed right axillary pathological adenopathy, largest lymph node 3.5 cm with maximum SUV 42.8.  An adjacent smaller peripheral right axillary lymph node measuring 1.3 cm with SUV of 23.3. Anterior mediastinal density favors benign thymic tissue maximum SUV 3.0 which is due [place] 4, far below the level of last has lymphadenopathy.   03/03/2020 - 05/30/2020 Chemotherapy   03/03/2020 - 05/30/2020 4 cycles of BV AVD Q21 days.   Patient does not desire to preserve fertility.  She has IUD for contraceptive measures.   05/20/2020 Imaging   PET showed complete response   06/02/2020 - 06/25/2020 Chemotherapy   2 cycles of BV AVD Q21 days   09/01/2020 Imaging   PET scan  1. No signs to suggest residual or recurrent FDG avid tumor.2. Resolution of previously noted generalized marrow hypermetabolism.3. FDG avid brown fat noted within the neck and chest compatible with benign physiologic activity.   09/13/2020 Procedure   Her Mediport was removed by Dr. Lucky Cowboy.   03/01/2021 Imaging   Surveillance PET showed No evidence of active lymphoma.   10/13/2021 Imaging   Surveillance PET showed  1. No evidence of lymphoma recurrence. No lymphadenopathy. Normal spleen and bone marrow.2. Particular attention directed to the RIGHT axilla.3. Benign cyst of the LEFT ovary   03/03/2022 Mammogram   Patient palpated left breast mass with rapid growth.   Bilateral diagnostic mammogram/ left axilla  showed 1. Suspicious 2.4 cm mass along the 11 o'clock axis of the left breast. Recommend ultrasound-guided biopsy. 2. No suspicious left axillary lymphadenopathy. 3. No mammographic evidence of malignancy on the right.   03/06/2022 Relapse/Recurrence   Left breast mass biopsy showed ALK  positive anaplastic large cell lymphoma. Ki67 100% Flowcytometry is overall non contributory.    03/15/2022 Imaging   PET restaging showed  1. New hypermetabolic round mass in the medial LEFT breast consistent with lymphoma recurrence. 2. New lesion in the mid body of the pancreas consistent with lymphoma recurrence/metastasis. 3. Very small hypermetabolic RIGHT supraclavicular node is concerning for lymphoma. 4. Two pulmonary nodules in the RIGHT lung are new from prior. Indeterminate nodules.    03/28/2022 Echocardiogram   2D Echo LVEF 60-65%   03/28/2022 Bone Marrow Biopsy   Bone marrow biopsy showed  Aspirate : no immunophenotypic evidence of a lymphoproliferative disorder (no monoclonal B cells or immunophenotypically abnormal T cells detected).  Core biopsy showed Hypocellular bone marrow (30%) with otherwise orderly trilineage hematopoiesis. -No morphologic or immunohistochemical evidence of the patient's known  recurrent anaplastic large cell lymphoma.      04/04/2022 -  Chemotherapy   Patient is on Treatment Plan : NON-HODGKINS LYMPHOMA ICE q21d       ALLERGIES:  has No Known Allergies.  MEDICATIONS:  Current Outpatient Medications  Medication Sig Dispense Refill   acetaminophen (TYLENOL) 500 MG tablet Take 500 mg by mouth every 6 (six) hours as needed.     allopurinol (ZYLOPRIM) 300 MG tablet Take 1 tablet (300 mg total) by mouth daily. (  Patient not taking: Reported on 03/28/2022) 30 tablet 2   dexamethasone (DECADRON) 4 MG tablet Take 2 tablets (8 mg total) by mouth daily. Start the day after chemotherapy for 2 days. Take with food. (Patient not taking: Reported on 03/31/2022) 30 tablet 1   escitalopram (LEXAPRO) 10 MG tablet Take 10 mg by mouth daily. (Patient not taking: Reported on 03/28/2022)     escitalopram (LEXAPRO) 5 MG tablet Take 5 mg by mouth daily. (Patient not taking: Reported on 03/21/2022)     Levonorgestrel (SKYLA) 13.5 MG IUD  (Patient not taking:  Reported on 03/28/2022)     loperamide (IMODIUM) 2 MG capsule Take 1 capsule (2 mg total) by mouth See admin instructions. Initial: 4 mg, followed by 2 mg every 2 to 4 hours or after each loose stool; Max 16 mg /24 hours (Patient not taking: Reported on 09/13/2020) 60 capsule 1   LORazepam (ATIVAN) 0.5 MG tablet Take 1 tablet (0.5 mg total) by mouth every 6 (six) hours as needed for anxiety. 60 tablet 0   ondansetron (ZOFRAN) 8 MG tablet Take 1 tablet (8 mg total) by mouth every 8 (eight) hours as needed for nausea or vomiting. Start 2 days after chemotherapy. (Patient not taking: Reported on 03/31/2022) 30 tablet 1   oxyCODONE (OXY IR/ROXICODONE) 5 MG immediate release tablet Take 1-2 tablets (5-10 mg total) by mouth every 6 (six) hours as needed for severe pain. 90 tablet 0   prochlorperazine (COMPAZINE) 10 MG tablet Take 1 tablet (10 mg total) by mouth every 6 (six) hours as needed for nausea or vomiting. (Patient not taking: Reported on 03/31/2022) 30 tablet 1   promethazine (PHENERGAN) 25 MG tablet Take 1 tablet (25 mg total) by mouth every 6 (six) hours as needed for nausea or vomiting. (Patient not taking: Reported on 09/03/2020) 120 tablet 2   venlafaxine (EFFEXOR) 37.5 MG tablet TAKE 1 TABLET BY MOUTH EVERY DAY (Patient not taking: Reported on 09/13/2020) 90 tablet 1   No current facility-administered medications for this visit.   Facility-Administered Medications Ordered in Other Visits  Medication Dose Route Frequency Provider Last Rate Last Admin   etoposide (VEPESID) 180 mg in sodium chloride 0.9 % 500 mL chemo infusion  100 mg/m2 (Order-Specific) Intravenous Once Earlie Server, MD       HYDROmorphone (DILAUDID) injection 0.25 mg  0.25 mg Intravenous Q3H PRN Earlie Server, MD   0.25 mg at 04/07/22 0955   ifosfamide (IFEX) 2,700 mg, mesna (MESNEX) 2,700 mg in sodium chloride 0.9 % 150 mL chemo infusion   Intravenous Once Earlie Server, MD       mesna (MESNEX) 700 mg in sodium chloride 0.9 % 50 mL infusion   400 mg/m2 (Order-Specific) Intravenous Once Earlie Server, MD        VITAL SIGNS: LMP 03/24/2022 (Exact Date) Comment: NEGATIVE PREGNANCY TEST 04/04/22 There were no vitals filed for this visit.  Estimated body mass index is 29.26 kg/m as calculated from the following:   Height as of 04/04/22: _0  (1.575 m).   Weight as of 04/04/22: 160 lb (72.6 kg).  LABS: CBC:    Component Value Date/Time   WBC 9.3 04/07/2022 0830   HGB 11.7 (L) 04/07/2022 0830   HCT 35.8 (L) 04/07/2022 0830   PLT 291 04/07/2022 0830   MCV 90.4 04/07/2022 0830   NEUTROABS 7.2 04/07/2022 0830   LYMPHSABS 1.7 04/07/2022 0830   MONOABS 0.3 04/07/2022 0830   EOSABS 0.0 04/07/2022 0830   BASOSABS 0.0 04/07/2022  0830   Comprehensive Metabolic Panel:    Component Value Date/Time   NA 136 04/04/2022 1313   K 3.8 04/04/2022 1313   CL 105 04/04/2022 1313   CO2 19 (L) 04/04/2022 1313   BUN 11 04/04/2022 1313   CREATININE 0.75 04/04/2022 1313   GLUCOSE 136 (H) 04/04/2022 1313   CALCIUM 9.1 04/04/2022 1313   AST 14 (L) 04/04/2022 1313   ALT 11 04/04/2022 1313   ALKPHOS 64 04/04/2022 1313   BILITOT 0.6 04/04/2022 1313   PROT 7.8 04/04/2022 1313   ALBUMIN 3.9 04/04/2022 1313    RADIOGRAPHIC STUDIES: IR FL GUIDED LOC OF NEEDL/CATH TIP FOR SPINAL INJECT LT  Result Date: 04/04/2022 CLINICAL DATA:  Provided history: Anaplastic ALK-positive large-cell lymphoma of lymph node of axilla. Request for lumbar puncture and intrathecal administration of methotrexate, cytarabine and hydrocortisone. EXAM: LUMBAR PUNCTURE UNDER FLUOROSCOPY PROCEDURE: Prior to the procedure, Rushie Nyhan, NP obtained informed consent from the patient. This process included a discussion of procedural risks. An appropriate skin entry site was determined under fluoroscopy and marked. A time-out was performed. The operated donned sterile gloves and a mask. The skin entry site was prepped with Betadine, draped in the usual sterile fashion and  infiltrated locally with 1% lidocaine. Subsequently under intermittent fluoroscopic guidance, a 20-gauge spinal needle was advanced into the thecal sac at the L4-L5 level. There was spontaneous return of clear CSF. 6 mL of CSF were collected for laboratory studies. Subsequently, a chemotherapy solution was injected into the thecal sac (12 mg methotrexate, 100 mg Solu-Cortef, 40 mg cytarabine). The inner stylet was then replaced within the needle and the needle was removed in its entirety. The patient tolerated the procedure well, and no immediate post-procedure complication was apparent. The examination was performed by Rushie Nyhan, NP, and was supervised and interpreted by Dr. Kellie Simmering. FLUOROSCOPY: Fluoroscopy time: 1 minute, 18 seconds (17.5 mGy). IMPRESSION: Technically successful fluoroscopically-guided L4-L5 lumbar puncture. 6 mL of CSF collected for laboratory studies. Intrathecal injection of chemotherapy and steroid. No immediate post-procedure complication. Electronically Signed   By: Kellie Simmering D.O.   On: 04/04/2022 11:23   ECHOCARDIOGRAM COMPLETE  Result Date: 03/31/2022    ECHOCARDIOGRAM REPORT   Patient Name:   JOMAYRA NOVITSKY Date of Exam: 03/31/2022 Medical Rec #:  [id number]          Height:       62.0 in Accession #:    [id number]         Weight:       162.0 lb Date of Birth:  [birth year]          BSA:          1.748 m Patient Age:    31 years           BP:           121/71 mmHg Patient Gender: F                  HR:           89 bpm. Exam Location:  [facility] Procedure: 2D Echo, Color Doppler, Cardiac Doppler and Strain Analysis Indications:     Z09 Encounter for monitoring cardiotoxic drug therapy  History:         Patient has prior history of Echocardiogram examinations, most                  recent 02/18/2020. Anaplastic ALK-positive large cell lymphoma  of lymph node of axilla.  Sonographer:     Charmayne Sheer Referring Phys:  [id number] ZHOU YU Diagnosing Phys: Ida Rogue MD  Sonographer Comments: Tumor on left breast. Global longitudinal strain was attempted. IMPRESSIONS  1. Left ventricular ejection fraction, by estimation, is 60 to 65%. The left ventricle has normal function. The left ventricle has no regional wall motion abnormalities. Left ventricular diastolic parameters were normal. The average left ventricular global longitudinal strain is -18.9 %.  2. Right ventricular systolic function is normal. The right ventricular size is normal.  3. The mitral valve is normal in structure. Mild mitral valve regurgitation. No evidence of mitral stenosis.  4. The aortic valve was not well visualized. Aortic valve regurgitation is not visualized. No aortic stenosis is present.  5. The inferior vena cava is normal in size with greater than 50% respiratory variability, suggesting right atrial pressure of 3 mmHg. FINDINGS  Left Ventricle: Left ventricular ejection fraction, by estimation, is 60 to 65%. The left ventricle has normal function. The left ventricle has no regional wall motion abnormalities. The average left ventricular global longitudinal strain is -18.9 %. The left ventricular internal cavity size was normal in size. There is no left ventricular hypertrophy. Left ventricular diastolic parameters were normal. Right Ventricle: The right ventricular size is normal. No increase in right ventricular wall thickness. Right ventricular systolic function is normal. Left Atrium: Left atrial size was normal in size. Right Atrium: Right atrial size was normal in size. Pericardium: There is no evidence of pericardial effusion. Mitral Valve: The mitral valve is normal in structure. Mild mitral valve regurgitation. No evidence of mitral valve stenosis. Tricuspid Valve: The tricuspid valve is normal in structure. Tricuspid valve regurgitation is not demonstrated. No evidence of tricuspid stenosis. Aortic Valve: The aortic valve was not well visualized. Aortic valve regurgitation is not  visualized. No aortic stenosis is present. Aortic valve mean gradient measures 4.0 mmHg. Aortic valve peak gradient measures 7.2 mmHg. Aortic valve area, by VTI measures 2.54 cm. Pulmonic Valve: The pulmonic valve was normal in structure. Pulmonic valve regurgitation is not visualized. No evidence of pulmonic stenosis. Aorta: The aortic root is normal in size and structure. Venous: The inferior vena cava is normal in size with greater than 50% respiratory variability, suggesting right atrial pressure of 3 mmHg. IAS/Shunts: No atrial level shunt detected by color flow Doppler.  LEFT VENTRICLE PLAX 2D LVIDd:         4.70 cm   Diastology LVIDs:         3.10 cm   LV e' medial:    11.60 cm/s LV PW:         1.20 cm   LV E/e' medial:  7.1 LV IVS:        0.80 cm   LV e' lateral:   19.70 cm/s LVOT diam:     2.20 cm   LV E/e' lateral: 4.2 LV SV:         67 LV SV Index:   38        2D Longitudinal Strain LVOT Area:     3.80 cm  2D Strain GLS Avg:     -18.9 %  RIGHT VENTRICLE RV Basal diam:  3.80 cm RV S prime:     14.10 cm/s LEFT ATRIUM             Index        RIGHT ATRIUM           Index  LA diam:        3.20 cm 1.83 cm/m   RA Area:     19.50 cm LA Vol (A2C):   29.2 ml 16.71 ml/m  RA Volume:   58.90 ml  33.70 ml/m LA Vol (A4C):   35.7 ml 20.42 ml/m LA Biplane Vol: 34.3 ml 19.62 ml/m  AORTIC VALVE                    PULMONIC VALVE AV Area (Vmax):    2.79 cm     PV Vmax:       1.21 m/s AV Area (Vmean):   2.54 cm     PV Vmean:      78.400 cm/s AV Area (VTI):     2.54 cm     PV VTI:        0.227 m AV Vmax:           134.00 cm/s  PV Peak grad:  5.9 mmHg AV Vmean:          90.400 cm/s  PV Mean grad:  3.0 mmHg AV VTI:            0.263 m AV Peak Grad:      7.2 mmHg AV Mean Grad:      4.0 mmHg LVOT Vmax:         98.20 cm/s LVOT Vmean:        60.300 cm/s LVOT VTI:          0.176 m LVOT/AV VTI ratio: 0.67  AORTA Ao Root diam: 2.70 cm MITRAL VALVE MV Area (PHT): 4.77 cm    SHUNTS MV Decel Time: 159 msec    Systemic VTI:  0.18  m MV E velocity: 82.30 cm/s  Systemic Diam: 2.20 cm MV A velocity: 57.60 cm/s MV E/A ratio:  1.43 Ida Rogue MD Electronically signed by Ida Rogue MD Signature Date/Time: 03/31/2022/12:46:09 PM    Final    CT BONE MARROW BIOPSY & ASPIRATION  Result Date: 03/28/2022 CLINICAL DATA:  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 EXAM: CT GUIDED BONE MARROW ASPIRATION AND BIOPSY ANESTHESIA/SEDATION: Moderate (conscious) sedation was employed during this procedure. A total of Versed 2.0 mg and Fentanyl 100 mcg was administered intravenously by radiology nursing. Moderate Sedation Time: 27 minutes. The patient's level of consciousness and vital signs were monitored continuously by radiology nursing throughout the procedure under my direct supervision. PROCEDURE: The procedure risks, benefits, and alternatives were explained to the patient. Questions regarding the procedure were encouraged and answered. The patient understands and consents to the procedure. A time out was performed prior to initiating the procedure. The right gluteal region was prepped with chlorhexidine. Sterile gown and sterile gloves were used for the procedure. Local anesthesia was provided with 1% Lidocaine. Under CT guidance, an 11 gauge On Control bone cutting needle was advanced from a posterior approach into the right iliac bone. Needle positioning was confirmed with CT. Initial non heparinized and heparinized aspirate samples were obtained of bone marrow. Core biopsy was performed via the On Control drill needle. Two separate core biopsy samples were obtained. COMPLICATIONS: None FINDINGS: Inspection of initial non heparinized aspirate for particles was hampered by clotting. For this reason, 2 separate core biopsy samples were obtained. IMPRESSION: CT guided bone marrow biopsy of right posterior iliac bone with both aspirate and core samples obtained. Electronically Signed   By: Aletta Edouard M.D.   On: 03/28/2022 11:07   MR  Brain W Wo Contrast  Result Date: 03/26/2022 CLINICAL DATA:  ALK positive large-cell lymphoma EXAM: MRI HEAD WITHOUT AND WITH CONTRAST TECHNIQUE: Multiplanar, multiecho pulse sequences of the brain and surrounding structures were obtained without and with intravenous contrast. CONTRAST:  7.72m GADAVIST GADOBUTROL 1 MMOL/ML IV SOLN COMPARISON:  None Available. FINDINGS: Brain: No abnormal parenchymal or meningeal enhancement. No restricted diffusion to suggest acute or subacute infarct. No acute hemorrhage, mass, mass effect, or midline shift. No hemosiderin deposition to suggest remote hemorrhage. No hydrocephalus or extra-axial collection. No abnormal T2 signal to suggest demyelinating disease. Vascular: Normal arterial flow voids. Normal arterial and venous enhancement. Skull and upper cervical spine: Normal marrow signal. Sinuses/Orbits: Mucous retention cyst in the left maxillary sinus. The orbits are unremarkable. Other: The mastoids are well aerated. IMPRESSION: No acute intracranial process. No evidence of intracranial metastatic disease. Electronically Signed   By: AMerilyn BabaM.D.   On: 03/26/2022 00:59   NM PET Image Restage (PS) Skull Base to Thigh (F-18 FDG)  Result Date: 03/15/2022 CLINICAL DATA:  Subsequent treatment strategy for anaplastic alk positive large-cell lymphoma. EXAM: NUCLEAR MEDICINE PET SKULL BASE TO THIGH TECHNIQUE: 9.2 mCi F-18 FDG was injected intravenously. Full-ring PET imaging was performed from the skull base to thigh after the radiotracer. CT data was obtained and used for attenuation correction and anatomic localization. Fasting blood glucose: 85 mg/dl COMPARISON:  PET-CT 10/12/2021 FINDINGS: Mediastinal blood pool activity: SUV max 1.6 Liver activity: SUV max 2.0 NECK: Hypermetabolic focus in the RIGHT supraclavicular neck appears to localize to a small node measuring 5 mm image 52/2. Activity is intense for size with SUV max  equal 5.7 (image 53 )PET data set Incidental CT findings: None. CHEST: Within the medial LEFT breast there is a new round mass measuring 2.9 cm in diameter and with intense metabolic activity (SUV max equal 34.3). Two small RIGHT lung nodules are new from prior. 8 mm nodule on image 91/2 and 5 mm nodule on image 87/2. These nodules appear associated with the horizontal fissure. No faint radiotracer activity associated with the larger nodule Incidental CT findings: None. ABDOMEN/PELVIS: New intense focal activity localizes to the body of the pancreas with SUV max equal 12.2 (image 129. Lesion in the subtly evident on the noncontrast CT measuring approximately 18 mm. Incidental CT findings: None. SKELETON: No focal hypermetabolic activity to suggest skeletal metastasis. Incidental CT findings: None. IMPRESSION: 1. New hypermetabolic round mass in the medial LEFT breast consistent with lymphoma recurrence. 2. New lesion in the mid body of the pancreas consistent with lymphoma recurrence/metastasis. 3. Very small hypermetabolic RIGHT supraclavicular node is concerning for lymphoma. 4. Two pulmonary nodules in the RIGHT lung are new from prior. Indeterminate nodules. Recommend close attention on follow-up. These results will be called to the ordering clinician or representative by the Radiologist Assistant, and communication documented in the PACS or [employer] Electronically Signed   By: SSuzy BouchardM.D.   On: 03/15/2022 12:33    PERFORMANCE STATUS (ECOG) : 1 - Symptomatic but completely ambulatory  Review of Systems Unless otherwise noted, a complete review of systems is negative.  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   Discussed with Dr. YTasia Catchingswho liberalized oxycodone today.  Discussed pain regimen in detail with patient and father  including safe storage and administration of opioids.  Also discussed with IR who agreed to see patient for consideration of a blood patch.  Patient will likely benefit from ongoing supportive care as she will repeat intrathecal chemo every 3 weeks.   Patient expressed understanding and was in agreement with this plan. She also understands that She can call clinic at any time with any questions, concerns, or complaints.   Thank you for allowing me to participate in the care of this very pleasant patient.   Time Total: 25 minutes  Visit consisted of counseling and education dealing with the complex and emotionally intense issues of symptom management in the setting of serious illness.Greater than 50%  of this time was spent counseling and coordinating care related to the above assessment and plan.  Signed by: Altha Harm, PhD, NP-C

## 2022-04-07 NOTE — Progress Notes (Signed)
Patient was complaining of headache and neck pain worse when sitting up x several days. MD was notified. Dilaudid was given per MD order. Pain improved but didn't go away. Patient was evaluated by Billey Chang, NP in infusion and it was determined that patient possibly had a spinal leak from a lumbar puncture that was performed on Monday of this week. Arrangements were made for patient to have a blood patch procedure today after her chemotherapy. Patient tolerated chemotherapy without incident. Patient was transferred via wheelchair to interventional radiology at 3:30 pm. Patient was accompanied by her sister.

## 2022-04-07 NOTE — Procedures (Signed)
Interventional Radiology Procedure Note  Date of Procedure: 04/07/2022  Procedure: Blood patch   Findings:  1. Successful blood patch at L3-L4 with 56m autologous blood administered    Complications: No immediate complications noted.   Estimated Blood Loss: minimal  Follow-up and Recommendations: 1. None    YAlbin Felling MD  Vascular & Interventional Radiology  04/07/2022 4:34 PM

## 2022-04-07 NOTE — Progress Notes (Signed)
Patient here today for LP blood patch per DR El-Abd, tolerated well. Vitals stable pre and post procedure. Discharge instructions given per DR El-Abd ie bedrest with hob flat this pm, rest as much as possible.

## 2022-04-07 NOTE — Patient Instructions (Signed)
[facility]  Discharge Instructions: Thank you for choosing [facility] to provide your oncology and hematology care.  If you have a lab appointment with the Cancer Center, please go directly to the Cancer Center and check in at the registration area.  Wear comfortable clothing and clothing appropriate for easy access to any Portacath or PICC line.   We strive to give you quality time with your provider. You may need to reschedule your appointment if you arrive late (15 or more minutes).  Arriving late affects you and other patients whose appointments are after yours.  Also, if you miss three or more appointments without notifying the office, you may be dismissed from the clinic at the provider's discretion.      For prescription refill requests, have your pharmacy contact our office and allow 72 hours for refills to be completed.       To help prevent nausea and vomiting after your treatment, we encourage you to take your nausea medication as directed.  BELOW ARE SYMPTOMS THAT SHOULD BE REPORTED IMMEDIATELY: *FEVER GREATER THAN 100.4 F (38 C) OR HIGHER *CHILLS OR SWEATING *NAUSEA AND VOMITING THAT IS NOT CONTROLLED WITH YOUR NAUSEA MEDICATION *UNUSUAL SHORTNESS OF BREATH *UNUSUAL BRUISING OR BLEEDING *URINARY PROBLEMS (pain or burning when urinating, or frequent urination) *BOWEL PROBLEMS (unusual diarrhea, constipation, pain near the anus) TENDERNESS IN MOUTH AND THROAT WITH OR WITHOUT PRESENCE OF ULCERS (sore throat, sores in mouth, or a toothache) UNUSUAL RASH, SWELLING OR PAIN  UNUSUAL VAGINAL DISCHARGE OR ITCHING   Items with * indicate a potential emergency and should be followed up as soon as possible or go to the Emergency Department if any problems should occur.  Please show the CHEMOTHERAPY ALERT CARD or IMMUNOTHERAPY ALERT CARD at check-in to the Emergency Department and triage nurse.  Should you have questions after your  visit or need to cancel or reschedule your appointment, please contact [facility]  [phone] and follow the prompts.  Office hours are 8:00 a.m. to 4:30 p.m. Monday - Friday. Please note that voicemails left after 4:00 p.m. may not be returned until the following business day.  We are closed weekends and major holidays. You have access to a nurse at all times for urgent questions. Please call the main number to the clinic [phone] and follow the prompts.  For any non-urgent questions, you may also contact your provider using MyChart. We now offer e-Visits for anyone 18 and older to request care online for non-urgent symptoms. For details visit [email or web address].   Also download the MyChart app! Go to the app store, search "MyChart", open the app, select [facility], and log in with your MyChart username and password.  Masks are optional in the cancer centers. If you would like for your care team to wear a mask while they are taking care of you, please let them know. For doctor visits, patients may have with them one support person who is at least 30 years old. At this time, visitors are not allowed in the infusion area.   

## 2022-04-08 ENCOUNTER — Encounter: Payer: Self-pay | Admitting: Oncology

## 2022-04-08 DIAGNOSIS — R112 Nausea with vomiting, unspecified: Secondary | ICD-10-CM | POA: Insufficient documentation

## 2022-04-08 DIAGNOSIS — T451X5A Adverse effect of antineoplastic and immunosuppressive drugs, initial encounter: Secondary | ICD-10-CM | POA: Insufficient documentation

## 2022-04-08 DIAGNOSIS — N63 Unspecified lump in unspecified breast: Secondary | ICD-10-CM | POA: Insufficient documentation

## 2022-04-08 DIAGNOSIS — G971 Other reaction to spinal and lumbar puncture: Secondary | ICD-10-CM | POA: Insufficient documentation

## 2022-04-08 NOTE — Assessment & Plan Note (Signed)
IR for blood patch

## 2022-04-08 NOTE — Progress Notes (Signed)
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 CSF flowcytometry pending.  Recommend salvage therapy bridging to allogenic transplant Proceed with cycle 1 ICE with GCSF support. Day 1 intrathecal triple therapy with MTX 47m, Ara C 419mand hydrocortisone 100 mg Day 2 Ifosfamide, Etoposide, [place] Day 3 Ifosfamide, Etoposide, Carboplatin, [place] Day 4 Ifosfamide, Etoposide, [place] Day 7 GCSF + IVF hydration.    Encounter for antineoplastic chemotherapy Chemotherapy plan as listed above.   Chemotherapy induced nausea and vomiting Recommend antimetics, Zofran, Compazine PRN, Ativan PRN as instructed  Headache after spinal puncture IR for blood patch  Neoplasm related pain Continue Oxycodone 56m64mRN as instructed.   Breast mass Due to lymphoma progression, with open wound.  Finishing bactrim course.   Orders Placed This Encounter  Procedures   DG FLUORO GUIDED LOC OF NEEDLE/CATH TIP FOR SPINAL INJECT LT    Standing Status:   Future    Standing Expiration Date:   04/05/2023    Order Specific Question:   Lab orders requested (DO NOT place separate lab orders, these will be automatically ordered during procedure specimen collection):    Answer:    None    Order Specific Question:   Reason for Exam (SYMPTOM  OR DIAGNOSIS REQUIRED)    Answer:   anaplastic ALK-positive large cell lymphoma of lymph node of axilla    Order Specific Question:   Is the patient pregnant?    Answer:   No    Order Specific Question:   Preferred Imaging Location?    Answer:   [facility]   Urinalysis, Complete w Microscopic    Standing Status:   Future    Standing Expiration Date:   04/26/2023   CBC with Differential    Standing Status:   Future    Standing Expiration Date:   04/26/2023   Comprehensive metabolic panel    Standing Status:   Future    Standing Expiration Date:   04/26/2023   Urinalysis, Complete w Microscopic    Standing Status:   Future    Standing Expiration Date:   04/28/2023   CBC with Differential    Standing Status:   Future    Standing Expiration Date:   04/28/2023   Urinalysis, Complete w Microscopic    Standing Status:   Future    Standing Expiration Date:   04/29/2023   CBC with Differential    Standing Status:   Future    Standing Expiration Date:   04/29/2023   Urinalysis, Complete w Microscopic    Standing Status:   Future    Standing Expiration Date:   05/17/2023   CBC with Differential    Standing Status:   Future    Standing Expiration Date:   05/17/2023   Comprehensive metabolic panel    Standing Status:   Future  Standing Expiration Date:   05/17/2023   Urinalysis, Complete w Microscopic    Standing Status:   Future    Standing Expiration Date:   05/19/2023   CBC with Differential    Standing Status:   Future    Standing Expiration Date:   05/19/2023   Urinalysis, Complete w Microscopic    Standing Status:   Future    Standing Expiration Date:   05/20/2023   CBC with Differential    Standing Status:   Future    Standing Expiration Date:   05/20/2023   Follow-up per LOS   All questions were answered. The patient knows to call the clinic with any problems, questions or concerns.  Earlie Server, MD,  PhD [facility] Hematology Oncology 04/04/2022     CHIEF COMPLAINTS/REASON FOR VISIT:  Follow up for anaplastic large cell lymphoma, ALK postive  HISTORY OF PRESENTING ILLNESS:   Angel Tate is a  31 y.o.  female presents for follow-up of ALK positive anaplastic large cell lymphoma Oncology history summary is listed below. 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  Initially the mass did not bother her.  Patient developed a rash in the right axillary/upper outer quadrant of the breast and had additional work-up.  Patient was treated with a 10-day course of doxycycline for possible infection.  Did not improve.  #02/04/2020, targeted ultrasound showed right axillary mass measuring 6.2 x 4 by 4 cm.  There are 2 adjacent smaller axillary mass measuring 2.0 x 1.2 x 1.4 cm and 0.8 x 0.7 x 0.9 cm.  Mammogram showed dense fibroglandular.  No suspicious mass or malignant type microcalcifications or distortion detected in either breast.  # Patient underwent ultrasound-guided core biopsy of the right axillary mass. 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  Ki-67 80-90%, Flow cytometry has insufficient cells for analysis.    02/17/2020 Bone Marrow Biopsy   Bone marrow biopsy is negative   02/18/2020 Echocardiogram   Echocardiogram showed normal LVEF   03/02/2020 Imaging   PET scan showed right axillary pathological adenopathy, largest lymph node 3.5 cm with maximum SUV 42.8.  An adjacent smaller peripheral right axillary lymph node measuring  1.3 cm with SUV of 23.3. Anterior mediastinal density favors benign thymic tissue maximum SUV 3.0 which is due [place] 4, far below the level of last has lymphadenopathy.   03/03/2020 - 05/30/2020 Chemotherapy   03/03/2020 - 05/30/2020 4 cycles of BV AVD Q21 days.   Patient does not desire to preserve fertility.  She has IUD for contraceptive measures.   05/20/2020 Imaging   PET showed complete response   06/02/2020 - 06/25/2020 Chemotherapy   2 cycles of BV AVD Q21 days   09/01/2020 Imaging   PET scan  1. No signs to suggest residual or recurrent FDG avid tumor.2. Resolution of previously noted generalized marrow hypermetabolism.3. FDG avid brown fat noted within the neck and chest compatible with benign physiologic activity.   09/13/2020 Procedure   Her Mediport was removed by Dr. Lucky Cowboy.   03/01/2021 Imaging   Surveillance PET showed No evidence  of active lymphoma.   10/13/2021 Imaging   Surveillance PET showed  1. No evidence of lymphoma recurrence. No lymphadenopathy. Normal spleen and bone marrow.2. Particular attention directed to the RIGHT axilla.3. Benign cyst of the LEFT ovary   03/03/2022 Mammogram   Patient palpated left breast mass with rapid growth.   Bilateral diagnostic mammogram/ left axilla  showed 1. Suspicious 2.4 cm mass along the 11 o'clock axis of the left breast. Recommend ultrasound-guided biopsy. 2. No suspicious left axillary lymphadenopathy. 3. No mammographic evidence of malignancy on the right.   03/06/2022 Relapse/Recurrence   Left breast mass biopsy showed ALK positive anaplastic large cell lymphoma. Ki67 100% Flowcytometry is overall non contributory.    03/15/2022 Imaging   PET restaging showed  1. New hypermetabolic round mass in the medial LEFT breast consistent with lymphoma recurrence. 2. New lesion in the mid body of the pancreas consistent with lymphoma recurrence/metastasis. 3. Very small hypermetabolic RIGHT supraclavicular node is concerning for  lymphoma. 4. Two pulmonary nodules in the RIGHT lung are new from prior. Indeterminate nodules.    03/28/2022 Echocardiogram   2D Echo LVEF 60-65%   03/28/2022 Bone Marrow Biopsy   Bone marrow biopsy showed  Aspirate : no immunophenotypic evidence of a lymphoproliferative disorder (no monoclonal B cells or immunophenotypically abnormal T cells detected).  Core biopsy showed Hypocellular bone marrow (30%) with otherwise orderly trilineage hematopoiesis. -No morphologic or immunohistochemical evidence of the patient's known  recurrent anaplastic large cell lymphoma.      04/04/2022 -  Chemotherapy   Patient is on Treatment Plan : NON-HODGKINS LYMPHOMA ICE q21d       Patient presents for evaluation prior to chemotherapy.  Left breast mass continues to grow in size, previous biopsy site now has open wound. Was seen by [facility] and prescribed a course of Bactrim, slightly improved.  + headache and nausea/vomiting after intrathecal chemotherapy this morning.     Review of Systems  Constitutional:  Negative for appetite change, chills, fatigue and fever.  HENT:   Negative for hearing loss and voice change.   Eyes:  Negative for eye problems.  Respiratory:  Negative for chest tightness, cough and shortness of breath.   Cardiovascular:  Negative for chest pain.  Gastrointestinal:  Positive for nausea and vomiting. Negative for abdominal distention, abdominal pain and blood in stool.  Endocrine: Negative for hot flashes.  Genitourinary:  Negative for difficulty urinating and frequency.   Musculoskeletal:  Negative for arthralgias.  Skin:  Negative for itching and rash.  Neurological:  Positive for headaches. Negative for extremity weakness.  Hematological:  Negative for adenopathy.  Psychiatric/Behavioral:  Negative for confusion.     MEDICAL HISTORY:  Past Medical History:  Diagnosis Date   Anxiety    Depression    Medical history non-contributory     SURGICAL HISTORY: Past  Surgical History:  Procedure Laterality Date   BREAST BIOPSY Left 03/06/2022   [place] LT BREAST BX W LOC DEV 1ST LESION IMG BX SPEC US GUIDE 03/06/2022 [facility] MAMMOGRAPHY   DILATION AND CURETTAGE OF UTERUS     DILATION AND EVACUATION N/A 02/17/2017   Procedure: DILATATION AND EVACUATION;  Surgeon: Janyth Pupa, DO;  Location: [facility];  Service: Gynecology;  Laterality: N/A;   IR FL GUIDED LOC OF NEEDLE/CATH TIP FOR SPINAL INJECTION LT  04/04/2022   PORTA CATH INSERTION N/A 02/23/2020   Procedure: PORTA CATH INSERTION;  Surgeon: Algernon Huxley, MD;  Location: [place] [facility];  Service: Cardiovascular;  Laterality:  N/A;   PORTA CATH REMOVAL N/A 09/13/2020   Procedure: PORTA CATH REMOVAL;  Surgeon: Algernon Huxley, MD;  Location: [place] [facility];  Service: Cardiovascular;  Laterality: N/A;    SOCIAL HISTORY: Social History   Socioeconomic History   Marital status: Married    Spouse name: Cody   Number of children: 1   Years of education: Not on file   Highest education level: Not on file  Occupational History   Occupation: social media- marketing   Tobacco Use   Smoking status: Never   Smokeless tobacco: Never  Vaping Use   Vaping Use: Never used  Substance and Sexual Activity   Alcohol use: Yes    Comment: socially   Drug use: No   Sexual activity: Yes    Birth control/protection: None  Other Topics Concern   Not on file  Social History Narrative   Not on file   Social Determinants of Health   Financial Resource Strain: Low Risk  (01/16/2019)   Overall Financial Resource Strain (CARDIA)    Difficulty of Paying Living Expenses: Not hard at all  Food Insecurity: No Food Insecurity (01/16/2019)   Hunger Vital Sign    Worried About Running Out of Food in the Last Year: Never true    Ran Out of Food in the Last Year: Never true  Transportation Needs: No Transportation Needs (01/16/2019)   PRAPARE - Hydrologist (Medical): No    Lack of  Transportation (Non-Medical): No  Physical Activity: Unknown (01/19/2019)   Exercise Vital Sign    Days of Exercise per Week: Patient refused    Minutes of Exercise per Session: Patient refused  Stress: No Stress Concern Present (01/19/2019)   [place]    Feeling of Stress : Only a little  Social Connections: Unknown (01/19/2019)   Social Connection and Isolation Panel [NHANES]    Frequency of Communication with Friends and Family: Patient refused    Frequency of Social Gatherings with Friends and Family: Patient refused    Attends Religious Services: Patient refused    Active Member of Clubs or Organizations: Patient refused    Attends Archivist Meetings: Patient refused    Marital Status: Patient refused  Intimate Partner Violence: Not At Risk (01/19/2019)   Humiliation, Afraid, Rape, and Kick questionnaire    Fear of Current or Ex-Partner: No    Emotionally Abused: No    Physically Abused: No    Sexually Abused: No    FAMILY HISTORY: Family History  Problem Relation Age of Onset   Asthma Maternal Aunt    Hypertension Maternal Grandmother    Ovarian cancer Paternal Grandmother        dx 55s-80s    ALLERGIES:  has No Known Allergies.  MEDICATIONS:  Current Outpatient Medications  Medication Sig Dispense Refill   acetaminophen (TYLENOL) 500 MG tablet Take 500 mg by mouth every 6 (six) hours as needed.     allopurinol (ZYLOPRIM) 300 MG tablet Take 1 tablet (300 mg total) by mouth daily. (Patient not taking: Reported on 03/28/2022) 30 tablet 2   dexamethasone (DECADRON) 4 MG tablet Take 2 tablets (8 mg total) by mouth daily. Start the day after chemotherapy for 2 days. Take with food. (Patient not taking: Reported on 03/31/2022) 30 tablet 1   escitalopram (LEXAPRO) 10 MG tablet Take 10 mg by mouth daily. (Patient not taking: Reported on 03/28/2022)     escitalopram (  LEXAPRO) 5 MG tablet Take 5 mg by  mouth daily. (Patient not taking: Reported on 03/21/2022)     Levonorgestrel (SKYLA) 13.5 MG IUD  (Patient not taking: Reported on 03/28/2022)     loperamide (IMODIUM) 2 MG capsule Take 1 capsule (2 mg total) by mouth See admin instructions. Initial: 4 mg, followed by 2 mg every 2 to 4 hours or after each loose stool; Max 16 mg /24 hours (Patient not taking: Reported on 09/13/2020) 60 capsule 1   LORazepam (ATIVAN) 0.5 MG tablet Take 1 tablet (0.5 mg total) by mouth every 6 (six) hours as needed for anxiety. 60 tablet 0   naloxone (NARCAN) nasal spray 4 mg/0.1 mL SPRAY 1 SPRAY INTO ONE NOSTRIL AS DIRECTED FOR OPIOID OVERDOSE (TURN PERSON ON SIDE AFTER DOSE. IF NO RESPONSE IN 2-3 MINUTES OR PERSON RESPONDS BUT RELAPSES, REPEAT USING A NEW SPRAY DEVICE AND SPRAY INTO THE OTHER NOSTRIL. CALL 911 AFTER USE.) * EMERGENCY USE ONLY * 1 each 0   ondansetron (ZOFRAN) 8 MG tablet Take 1 tablet (8 mg total) by mouth every 8 (eight) hours as needed for nausea or vomiting. Start 2 days after chemotherapy. (Patient not taking: Reported on 03/31/2022) 30 tablet 1   oxyCODONE (OXY IR/ROXICODONE) 5 MG immediate release tablet Take 1-2 tablets (5-10 mg total) by mouth every 6 (six) hours as needed for severe pain. 90 tablet 0   prochlorperazine (COMPAZINE) 10 MG tablet Take 1 tablet (10 mg total) by mouth every 6 (six) hours as needed for nausea or vomiting. (Patient not taking: Reported on 03/31/2022) 30 tablet 1   promethazine (PHENERGAN) 25 MG tablet Take 1 tablet (25 mg total) by mouth every 6 (six) hours as needed for nausea or vomiting. (Patient not taking: Reported on 09/03/2020) 120 tablet 2   venlafaxine (EFFEXOR) 37.5 MG tablet TAKE 1 TABLET BY MOUTH EVERY DAY (Patient not taking: Reported on 09/13/2020) 90 tablet 1   No current facility-administered medications for this visit.     PHYSICAL EXAMINATION: ECOG PERFORMANCE STATUS: 1 - Symptomatic but completely ambulatory Vitals:   04/04/22 1333  BP: 116/71   Pulse: 82  Resp: 15  Temp: 99.2 F (37.3 C)   There were no vitals filed for this visit.   Physical Exam Constitutional:      General: She is not in acute distress. HENT:     Head: Normocephalic and atraumatic.  Eyes:     General: No scleral icterus. Cardiovascular:     Rate and Rhythm: Normal rate and regular rhythm.  Pulmonary:     Effort: Pulmonary effort is normal. No respiratory distress.     Breath sounds: No wheezing.  Abdominal:     General: Bowel sounds are normal. There is no distension.     Palpations: Abdomen is soft.  Musculoskeletal:        General: No deformity. Normal range of motion.     Cervical back: Normal range of motion and neck supple.  Skin:    General: Skin is warm and dry.     Findings: Lesion present.  Neurological:     Mental Status: She is alert and oriented to person, place, and time. Mental status is at baseline.     Cranial Nerves: No cranial nerve deficit.     Coordination: Coordination normal.  Psychiatric:        Mood and Affect: Mood normal.     LABORATORY DATA:  I have reviewed the data as listed     Latest  Ref Rng & Units 04/07/2022    8:30 AM 04/06/2022    8:40 AM 04/04/2022    1:13 PM  CBC  WBC 4.0 - 10.5 K/uL 9.3  10.1  13.3   Hemoglobin 12.0 - 15.0 g/dL 11.7  12.0  12.7   Hematocrit 36.0 - 46.0 % 35.8  35.6  38.2   Platelets 150 - 400 K/uL 291  306  315       Latest Ref Rng & Units 04/04/2022    1:13 PM 03/28/2022    8:05 AM 10/17/2021    1:53 PM  CMP  Glucose 70 - 99 mg/dL 136  105  89   BUN 6 - 20 mg/dL _0 Creatinine 0.44 - 1.00 mg/dL 0.75  0.66  0.87   Sodium 135 - 145 mmol/L 136  137  139   Potassium 3.5 - 5.1 mmol/L 3.8  4.5  4.3   Chloride 98 - 111 mmol/L 105  105  104   CO2 22 - 32 mmol/L _1 Calcium 8.9 - 10.3 mg/dL 9.1  9.1  9.2   Total Protein 6.5 - 8.1 g/dL 7.8  7.5  7.2   Total Bilirubin 0.3 - 1.2 mg/dL 0.6  0.3  0.4   Alkaline Phos 38 - 126 U/L 64  60  47   AST 15 - 41 U/L _2 ALT 0 - 44 U/L _3 Iron/TIBC/Ferritin/ %Sat No results found for: "IRON", "TIBC", "FERRITIN", "IRONPCTSAT"    RADIOGRAPHIC STUDIES: I have personally reviewed the radiological images as listed and agreed with the findings in the report. IR FL GUIDED LOC OF NEEDL/CATH TIP FOR SPINAL INJECT LT  Result Date: 04/04/2022 CLINICAL DATA:  Provided history: Anaplastic ALK-positive large-cell lymphoma of lymph node of axilla. Request for lumbar puncture and intrathecal administration of methotrexate, cytarabine and hydrocortisone. EXAM: LUMBAR PUNCTURE UNDER FLUOROSCOPY PROCEDURE: Prior to the procedure, Rushie Nyhan, NP obtained informed consent from the patient. This process included a discussion of procedural risks. An appropriate skin entry site was determined under fluoroscopy and marked. A time-out was performed. The operated donned sterile gloves and a mask. The skin entry site was prepped with Betadine, draped in the usual sterile fashion and infiltrated locally with 1% lidocaine. Subsequently under intermittent fluoroscopic guidance, a 20-gauge spinal needle was advanced into the thecal sac at the L4-L5 level. There was spontaneous return of clear CSF. 6 mL of CSF were collected for laboratory studies. Subsequently, a chemotherapy solution was injected into the thecal sac (12 mg methotrexate, 100 mg Solu-Cortef, 40 mg cytarabine). The inner stylet was then replaced within the needle and the needle was removed in its entirety. The patient tolerated the procedure well, and no immediate post-procedure complication was apparent. The examination was performed by Rushie Nyhan, NP, and was supervised and interpreted by Dr. Kellie Simmering. FLUOROSCOPY: Fluoroscopy time: 1 minute, 18 seconds (17.5 mGy). IMPRESSION: Technically successful fluoroscopically-guided L4-L5 lumbar puncture. 6 mL of CSF collected for laboratory studies. Intrathecal injection of chemotherapy and steroid. No  immediate post-procedure complication. Electronically Signed   By: Kellie Simmering D.O.   On: 04/04/2022 11:23   ECHOCARDIOGRAM COMPLETE  Result Date: 03/31/2022    ECHOCARDIOGRAM REPORT   Patient Name:   MACIL CRADY Date of Exam: 03/31/2022 Medical Rec #:  [id number]          Height:  62.0 in Accession #:    [id number]         Weight:       162.0 lb Date of Birth:  [birth year]          BSA:          1.748 m Patient Age:    31 years           BP:           121/71 mmHg Patient Gender: F                  HR:           89 bpm. Exam Location:  [facility] Procedure: 2D Echo, Color Doppler, Cardiac Doppler and Strain Analysis Indications:     Z09 Encounter for monitoring cardiotoxic drug therapy  History:         Patient has prior history of Echocardiogram examinations, most                  recent 02/18/2020. Anaplastic ALK-positive large cell lymphoma                  of lymph node of axilla.  Sonographer:     Charmayne Sheer Referring Phys:  [id number] Sua Spadafora Diagnosing Phys: Ida Rogue MD  Sonographer Comments: Tumor on left breast. Global longitudinal strain was attempted. IMPRESSIONS  1. Left ventricular ejection fraction, by estimation, is 60 to 65%. The left ventricle has normal function. The left ventricle has no regional wall motion abnormalities. Left ventricular diastolic parameters were normal. The average left ventricular global longitudinal strain is -18.9 %.  2. Right ventricular systolic function is normal. The right ventricular size is normal.  3. The mitral valve is normal in structure. Mild mitral valve regurgitation. No evidence of mitral stenosis.  4. The aortic valve was not well visualized. Aortic valve regurgitation is not visualized. No aortic stenosis is present.  5. The inferior vena cava is normal in size with greater than 50% respiratory variability, suggesting right atrial pressure of 3 mmHg. FINDINGS  Left Ventricle: Left ventricular ejection fraction, by estimation, is 60 to 65%. The left  ventricle has normal function. The left ventricle has no regional wall motion abnormalities. The average left ventricular global longitudinal strain is -18.9 %. The left ventricular internal cavity size was normal in size. There is no left ventricular hypertrophy. Left ventricular diastolic parameters were normal. Right Ventricle: The right ventricular size is normal. No increase in right ventricular wall thickness. Right ventricular systolic function is normal. Left Atrium: Left atrial size was normal in size. Right Atrium: Right atrial size was normal in size. Pericardium: There is no evidence of pericardial effusion. Mitral Valve: The mitral valve is normal in structure. Mild mitral valve regurgitation. No evidence of mitral valve stenosis. Tricuspid Valve: The tricuspid valve is normal in structure. Tricuspid valve regurgitation is not demonstrated. No evidence of tricuspid stenosis. Aortic Valve: The aortic valve was not well visualized. Aortic valve regurgitation is not visualized. No aortic stenosis is present. Aortic valve mean gradient measures 4.0 mmHg. Aortic valve peak gradient measures 7.2 mmHg. Aortic valve area, by VTI measures 2.54 cm. Pulmonic Valve: The pulmonic valve was normal in structure. Pulmonic valve regurgitation is not visualized. No evidence of pulmonic stenosis. Aorta: The aortic root is normal in size and structure. Venous: The inferior vena cava is normal in size with greater than 50% respiratory variability, suggesting right atrial pressure of 3 mmHg. IAS/Shunts: No atrial level  shunt detected by color flow Doppler.  LEFT VENTRICLE PLAX 2D LVIDd:         4.70 cm   Diastology LVIDs:         3.10 cm   LV e' medial:    11.60 cm/s LV PW:         1.20 cm   LV E/e' medial:  7.1 LV IVS:        0.80 cm   LV e' lateral:   19.70 cm/s LVOT diam:     2.20 cm   LV E/e' lateral: 4.2 LV SV:         67 LV SV Index:   38        2D Longitudinal Strain LVOT Area:     3.80 cm  2D Strain GLS Avg:      -18.9 %  RIGHT VENTRICLE RV Basal diam:  3.80 cm RV S prime:     14.10 cm/s LEFT ATRIUM             Index        RIGHT ATRIUM           Index LA diam:        3.20 cm 1.83 cm/m   RA Area:     19.50 cm LA Vol (A2C):   29.2 ml 16.71 ml/m  RA Volume:   58.90 ml  33.70 ml/m LA Vol (A4C):   35.7 ml 20.42 ml/m LA Biplane Vol: 34.3 ml 19.62 ml/m  AORTIC VALVE                    PULMONIC VALVE AV Area (Vmax):    2.79 cm     PV Vmax:       1.21 m/s AV Area (Vmean):   2.54 cm     PV Vmean:      78.400 cm/s AV Area (VTI):     2.54 cm     PV VTI:        0.227 m AV Vmax:           134.00 cm/s  PV Peak grad:  5.9 mmHg AV Vmean:          90.400 cm/s  PV Mean grad:  3.0 mmHg AV VTI:            0.263 m AV Peak Grad:      7.2 mmHg AV Mean Grad:      4.0 mmHg LVOT Vmax:         98.20 cm/s LVOT Vmean:        60.300 cm/s LVOT VTI:          0.176 m LVOT/AV VTI ratio: 0.67  AORTA Ao Root diam: 2.70 cm MITRAL VALVE MV Area (PHT): 4.77 cm    SHUNTS MV Decel Time: 159 msec    Systemic VTI:  0.18 m MV E velocity: 82.30 cm/s  Systemic Diam: 2.20 cm MV A velocity: 57.60 cm/s MV E/A ratio:  1.43 Ida Rogue MD Electronically signed by Ida Rogue MD Signature Date/Time: 03/31/2022/12:46:09 PM    Final    CT BONE MARROW BIOPSY & ASPIRATION  Result Date: 03/28/2022 CLINICAL DATA:  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 EXAM: CT GUIDED BONE MARROW ASPIRATION AND BIOPSY ANESTHESIA/SEDATION: Moderate (conscious) sedation was employed during this procedure. A total of Versed 2.0 mg and Fentanyl 100 mcg was administered intravenously by radiology nursing. Moderate Sedation Time: 27 minutes. The patient's level of  consciousness and vital signs were monitored continuously by radiology nursing throughout the procedure under my direct supervision. PROCEDURE: The procedure risks, benefits, and alternatives were explained to the patient. Questions regarding the procedure were encouraged and  answered. The patient understands and consents to the procedure. A time out was performed prior to initiating the procedure. The right gluteal region was prepped with chlorhexidine. Sterile gown and sterile gloves were used for the procedure. Local anesthesia was provided with 1% Lidocaine. Under CT guidance, an 11 gauge On Control bone cutting needle was advanced from a posterior approach into the right iliac bone. Needle positioning was confirmed with CT. Initial non heparinized and heparinized aspirate samples were obtained of bone marrow. Core biopsy was performed via the On Control drill needle. Two separate core biopsy samples were obtained. COMPLICATIONS: None FINDINGS: Inspection of initial non heparinized aspirate for particles was hampered by clotting. For this reason, 2 separate core biopsy samples were obtained. IMPRESSION: CT guided bone marrow biopsy of right posterior iliac bone with both aspirate and core samples obtained. Electronically Signed   By: Aletta Edouard M.D.   On: 03/28/2022 11:07   MR Brain W Wo Contrast  Result Date: 03/26/2022 CLINICAL DATA:  ALK positive large-cell lymphoma EXAM: MRI HEAD WITHOUT AND WITH CONTRAST TECHNIQUE: Multiplanar, multiecho pulse sequences of the brain and surrounding structures were obtained without and with intravenous contrast. CONTRAST:  7.43m GADAVIST GADOBUTROL 1 MMOL/ML IV SOLN COMPARISON:  None Available. FINDINGS: Brain: No abnormal parenchymal or meningeal enhancement. No restricted diffusion to suggest acute or subacute infarct. No acute hemorrhage, mass, mass effect, or midline shift. No hemosiderin deposition to suggest remote hemorrhage. No hydrocephalus or extra-axial collection. No abnormal T2 signal to suggest demyelinating disease. Vascular: Normal arterial flow voids. Normal arterial and venous enhancement. Skull and upper cervical spine: Normal marrow signal. Sinuses/Orbits: Mucous retention cyst in the left maxillary sinus. The orbits  are unremarkable. Other: The mastoids are well aerated. IMPRESSION: No acute intracranial process. No evidence of intracranial metastatic disease. Electronically Signed   By: AMerilyn BabaM.D.   On: 03/26/2022 00:59   NM PET Image Restage (PS) Skull Base to Thigh (F-18 FDG)  Result Date: 03/15/2022 CLINICAL DATA:  Subsequent treatment strategy for anaplastic alk positive large-cell lymphoma. EXAM: NUCLEAR MEDICINE PET SKULL BASE TO THIGH TECHNIQUE: 9.2 mCi F-18 FDG was injected intravenously. Full-ring PET imaging was performed from the skull base to thigh after the radiotracer. CT data was obtained and used for attenuation correction and anatomic localization. Fasting blood glucose: 85 mg/dl COMPARISON:  PET-CT 10/12/2021 FINDINGS: Mediastinal blood pool activity: SUV max 1.6 Liver activity: SUV max 2.0 NECK: Hypermetabolic focus in the RIGHT supraclavicular neck appears to localize to a small node measuring 5 mm image 52/2. Activity is intense for size with SUV max equal 5.7 (image 53 )PET data set Incidental CT findings: None. CHEST: Within the medial LEFT breast there is a new round mass measuring 2.9 cm in diameter and with intense metabolic activity (SUV max equal 34.3). Two small RIGHT lung nodules are new from prior. 8 mm nodule on image 91/2 and 5 mm nodule on image 87/2. These nodules appear associated with the horizontal fissure. No faint radiotracer activity associated with the larger nodule Incidental CT findings: None. ABDOMEN/PELVIS: New intense focal activity localizes to the body of the pancreas with SUV max equal 12.2 (image 129. Lesion in the subtly evident on the noncontrast CT measuring approximately 18 mm. Incidental CT findings: None. SKELETON: No focal  hypermetabolic activity to suggest skeletal metastasis. Incidental CT findings: None. IMPRESSION: 1. New hypermetabolic round mass in the medial LEFT breast consistent with lymphoma recurrence. 2. New lesion in the mid body of the  pancreas consistent with lymphoma recurrence/metastasis. 3. Very small hypermetabolic RIGHT supraclavicular node is concerning for lymphoma. 4. Two pulmonary nodules in the RIGHT lung are new from prior. Indeterminate nodules. Recommend close attention on follow-up. These results will be called to the ordering clinician or representative by the Radiologist Assistant, and communication documented in the PACS or [employer]. Electronically Signed   By: Suzy Bouchard M.D.   On: 03/15/2022 12:33

## 2022-04-08 NOTE — Assessment & Plan Note (Signed)
Chemotherapy plan as listed above 

## 2022-04-08 NOTE — Assessment & Plan Note (Signed)
Recommend antimetics, Zofran, Compazine PRN, Ativan PRN as instructed

## 2022-04-08 NOTE — Assessment & Plan Note (Signed)
Due to lymphoma progression, with open wound.  Finishing bactrim course.

## 2022-04-08 NOTE — Assessment & Plan Note (Signed)
Continue Oxycodone '5mg'$  PRN as instructed.

## 2022-04-10 ENCOUNTER — Inpatient Hospital Stay (HOSPITAL_BASED_OUTPATIENT_CLINIC_OR_DEPARTMENT_OTHER): Payer: BC Managed Care – PPO | Admitting: Hospice and Palliative Medicine

## 2022-04-10 ENCOUNTER — Other Ambulatory Visit: Payer: Self-pay

## 2022-04-10 ENCOUNTER — Ambulatory Visit: Payer: BC Managed Care – PPO

## 2022-04-10 ENCOUNTER — Encounter: Payer: Self-pay | Admitting: Hospice and Palliative Medicine

## 2022-04-10 ENCOUNTER — Inpatient Hospital Stay: Payer: BC Managed Care – PPO

## 2022-04-10 DIAGNOSIS — C8468 Anaplastic large cell lymphoma, ALK-positive, lymph nodes of multiple sites: Secondary | ICD-10-CM | POA: Diagnosis not present

## 2022-04-10 DIAGNOSIS — C8464 Anaplastic large cell lymphoma, ALK-positive, lymph nodes of axilla and upper limb: Secondary | ICD-10-CM

## 2022-04-10 DIAGNOSIS — G971 Other reaction to spinal and lumbar puncture: Secondary | ICD-10-CM | POA: Diagnosis not present

## 2022-04-10 MED ORDER — PEGFILGRASTIM-CBQV 6 MG/0.6ML ~~LOC~~ SOSY
6.0000 mg | PREFILLED_SYRINGE | Freq: Once | SUBCUTANEOUS | Status: AC
  Administered 2022-04-10: 6 mg via SUBCUTANEOUS
  Filled 2022-04-10: qty 0.6

## 2022-04-10 MED ORDER — BUTALBITAL-APAP-CAFFEINE 50-325-40 MG PO TABS: 1.0000 | ORAL_TABLET | Freq: Four times a day (QID) | ORAL | 0 refills | Status: AC | PRN

## 2022-04-10 MED ORDER — SODIUM CHLORIDE 0.9 % IV SOLN
INTRAVENOUS | Status: DC
  Filled 2022-04-10 (×2): qty 250

## 2022-04-10 NOTE — Progress Notes (Signed)
 Symptom Management Clinic [facility] [phone] [phone]  Patient Care Team: Patient, No Pcp Per as PCP - General (General Practice) Yu, Zhou, MD as Consulting Physician (Oncology)   NAME OF PATIENT: Angel Tate  [id number]  11/28/1990   DATE OF VISIT: 04/10/22  REASON FOR CONSULT: Angel Tate is a 31 y.o. 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  Patient is on salvage chemotherapy with ICE regimen.  She also initiated intrathecal triple therapy with MTX, Ara C, and hydrocortisone.  INTERVAL HISTORY: Patient underwent LP with intrathecal chemotherapy last week.  She has subsequently had persistent headache/neck pain.  She was seen by IR on Friday with placement of blood patch.  Follow-up visit today.  Patient reports that neck pain has resolved.  She still has a dull headache without any other neurosensory symptoms.  No fever or chills.  Headache is worse when she sits or stands and is improved by lying flat.  Denies recent fevers or illnesses. Denies any easy bleeding or bruising. Reports good appetite and denies weight loss. Denies chest pain. Denies any nausea, vomiting, constipation, or diarrhea. Denies urinary complaints. Patient offers no further specific complaints today.   SOCIAL HISTORY: Patient is married lives at home with her husband and 2 young children.  She has good social support from parents.  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   PAST MEDICAL HISTORY: Past Medical History:  Diagnosis Date   Anxiety    Depression    Medical history non-contributory     PAST SURGICAL HISTORY:  Past Surgical History:  Procedure Laterality Date   BREAST BIOPSY Left 03/06/2022   US LT BREAST BX W LOC DEV 1ST LESION IMG BX SPEC US GUIDE 03/06/2022 [facility] MAMMOGRAPHY   DILATION  AND CURETTAGE OF UTERUS     DILATION AND EVACUATION N/A 02/17/2017   Procedure: DILATATION AND EVACUATION;  Surgeon: Ozan, Jennifer, DO;  Location: [facility];  Service: Gynecology;  Laterality: N/A;   IR FL GUIDED LOC OF NEEDLE/CATH TIP FOR SPINAL INJECTION LT  04/04/2022   IR FLUORO GUIDED NEEDLE PLC ASPIRATION/INJECTION LOC  04/07/2022   PORTA CATH INSERTION N/A 02/23/2020   Procedure: PORTA CATH INSERTION;  Surgeon: Dew, Jason S, MD;  Location: [facility] INVASIVE CV LAB;  Service: Cardiovascular;  Laterality: N/A;   PORTA CATH REMOVAL N/A 09/13/2020   Procedure: PORTA CATH REMOVAL;  Surgeon: Dew, Jason S, MD;  Location: [facility] INVASIVE CV LAB;  Service: Cardiovascular;  Laterality: N/A;    HEMATOLOGY/ONCOLOGY HISTORY:  Oncology History  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  Initially the mass did not bother her.  Patient developed a rash in the right axillary/upper outer quadrant of the breast and had additional work-up.  Patient was treated with a 10-day course of doxycycline for possible infection.  Did not improve.  #02/04/2020, targeted ultrasound showed right axillary mass measuring 6.2 x 4 by 4 cm.  There are 2 adjacent smaller axillary mass measuring 2.0 x   1.2 x 1.4 cm and 0.8 x 0.7 x 0.9 cm.  Mammogram showed dense fibroglandular.  No suspicious mass or malignant type microcalcifications or distortion detected in either breast.  # Patient underwent ultrasound-guided core biopsy of the right axillary mass. 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  Ki-67 80-90%,  Flow cytometry has insufficient cells for analysis.    02/17/2020 Bone Marrow Biopsy   Bone marrow biopsy is negative   02/18/2020 Echocardiogram   Echocardiogram showed normal LVEF   03/02/2020 Imaging   PET scan showed right axillary pathological adenopathy, largest lymph node 3.5 cm with maximum SUV 42.8.  An adjacent smaller peripheral right axillary lymph node measuring 1.3 cm with SUV of 23.3. Anterior mediastinal density favors benign thymic tissue maximum SUV 3.0 which is due [place] 4, far below the level of last has lymphadenopathy.   03/03/2020 - 05/30/2020 Chemotherapy   03/03/2020 - 05/30/2020 4 cycles of BV AVD Q21 days.   Patient does not desire to preserve fertility.  She has IUD for contraceptive measures.   05/20/2020 Imaging   PET showed complete response   06/02/2020 - 06/25/2020 Chemotherapy   2 cycles of BV AVD Q21 days   09/01/2020 Imaging   PET scan  1. No signs to suggest residual or recurrent FDG avid tumor.2. Resolution of previously noted generalized marrow hypermetabolism.3. FDG avid brown fat noted within the neck and chest compatible with benign physiologic activity.   09/13/2020 Procedure   Her Mediport was removed by Dr. Dew.   03/01/2021 Imaging   Surveillance PET showed No evidence of active lymphoma.   10/13/2021 Imaging   Surveillance PET showed  1. No evidence of lymphoma recurrence. No lymphadenopathy. Normal spleen and bone marrow.2. Particular attention directed to the RIGHT axilla.3. Benign cyst of the LEFT ovary   03/03/2022 Mammogram   Patient palpated left breast mass with rapid growth.   Bilateral diagnostic mammogram/ left axilla  showed 1. Suspicious 2.4 cm mass along the 11 o'clock axis of the left breast. Recommend ultrasound-guided biopsy. 2. No suspicious left axillary lymphadenopathy. 3. No mammographic evidence of malignancy on the right.   03/06/2022 Relapse/Recurrence   Left breast mass biopsy showed ALK positive anaplastic large  cell lymphoma. Ki67 100% Flowcytometry is overall non contributory.    03/15/2022 Imaging   PET restaging showed  1. New hypermetabolic round mass in the medial LEFT breast consistent with lymphoma recurrence. 2. New lesion in the mid body of the pancreas consistent with lymphoma recurrence/metastasis. 3. Very small hypermetabolic RIGHT supraclavicular node is concerning for lymphoma. 4. Two pulmonary nodules in the RIGHT lung are new from prior. Indeterminate nodules.    03/21/2022 Cancer Staging   Staging form: Hodgkin and Non-Hodgkin Lymphoma, AJCC 8th Edition - Clinical stage from 03/21/2022: Stage IV - Signed by Yu, Zhou, MD on 04/08/2022 Histopathologic type: Anaplastic large cell lymphoma, T cell and Null cell type Stage prefix: Recurrence   03/28/2022 Echocardiogram   2D Echo LVEF 60-65%   03/28/2022 Bone Marrow Biopsy   Bone marrow biopsy showed  Aspirate : no immunophenotypic evidence of a lymphoproliferative disorder (no monoclonal B cells or immunophenotypically abnormal T cells detected).  Core biopsy showed Hypocellular bone marrow (30%) with otherwise orderly trilineage hematopoiesis. -No morphologic or immunohistochemical evidence of the patient's known  recurrent anaplastic large cell lymphoma.      04/04/2022 -  Chemotherapy   Patient is on Treatment Plan : NON-HODGKINS LYMPHOMA ICE q21d   Intrathecal with MTX 12mg,   Ara C 33m and hydrocortisone 100 mg    04/04/2022 Procedure   Lumbar puncture   CSF flowcytometry  Insufficient B and T cells detected to adequately evaluate for clonality or aberrancy in a low cellularity specimen,      ALLERGIES:  has No Known Allergies.  MEDICATIONS:  Current Outpatient Medications  Medication Sig Dispense Refill   butalbital-acetaminophen-caffeine (FIORICET) 50-325-40 MG tablet Take 1 tablet by mouth every 6 (six) hours as needed for headache. 20 tablet 0   acetaminophen (TYLENOL) 500 MG tablet Take 500 mg by mouth every  6 (six) hours as needed.     allopurinol (ZYLOPRIM) 300 MG tablet Take 1 tablet (300 mg total) by mouth daily. (Patient not taking: Reported on 03/28/2022) 30 tablet 2   dexamethasone (DECADRON) 4 MG tablet Take 2 tablets (8 mg total) by mouth daily. Start the day after chemotherapy for 2 days. Take with food. (Patient not taking: Reported on 03/31/2022) 30 tablet 1   escitalopram (LEXAPRO) 10 MG tablet Take 10 mg by mouth daily. (Patient not taking: Reported on 03/28/2022)     escitalopram (LEXAPRO) 5 MG tablet Take 5 mg by mouth daily. (Patient not taking: Reported on 03/21/2022)     Levonorgestrel (SKYLA) 13.5 MG IUD  (Patient not taking: Reported on 03/28/2022)     loperamide (IMODIUM) 2 MG capsule Take 1 capsule (2 mg total) by mouth See admin instructions. Initial: 4 mg, followed by 2 mg every 2 to 4 hours or after each loose stool; Max 16 mg /24 hours (Patient not taking: Reported on 09/13/2020) 60 capsule 1   LORazepam (ATIVAN) 0.5 MG tablet Take 1 tablet (0.5 mg total) by mouth every 6 (six) hours as needed for anxiety. 60 tablet 0   naloxone (NARCAN) nasal spray 4 mg/0.1 mL SPRAY 1 SPRAY INTO ONE NOSTRIL AS DIRECTED FOR OPIOID OVERDOSE (TURN PERSON ON SIDE AFTER DOSE. IF NO RESPONSE IN 2-3 MINUTES OR PERSON RESPONDS BUT RELAPSES, REPEAT USING A NEW SPRAY DEVICE AND SPRAY INTO THE OTHER NOSTRIL. CALL 911 AFTER USE.) * EMERGENCY USE ONLY * 1 each 0   ondansetron (ZOFRAN) 8 MG tablet Take 1 tablet (8 mg total) by mouth every 8 (eight) hours as needed for nausea or vomiting. Start 2 days after chemotherapy. (Patient not taking: Reported on 03/31/2022) 30 tablet 1   oxyCODONE (OXY IR/ROXICODONE) 5 MG immediate release tablet Take 1-2 tablets (5-10 mg total) by mouth every 6 (six) hours as needed for severe pain. 90 tablet 0   prochlorperazine (COMPAZINE) 10 MG tablet Take 1 tablet (10 mg total) by mouth every 6 (six) hours as needed for nausea or vomiting. (Patient not taking: Reported on 03/31/2022) 30  tablet 1   promethazine (PHENERGAN) 25 MG tablet Take 1 tablet (25 mg total) by mouth every 6 (six) hours as needed for nausea or vomiting. (Patient not taking: Reported on 09/03/2020) 120 tablet 2   venlafaxine (EFFEXOR) 37.5 MG tablet TAKE 1 TABLET BY MOUTH EVERY DAY (Patient not taking: Reported on 09/13/2020) 90 tablet 1   No current facility-administered medications for this visit.    VITAL SIGNS: LMP 03/24/2022 (Exact Date) Comment: NEGATIVE PREGNANCY TEST 04/04/22 There were no vitals filed for this visit.  Estimated body mass index is 29.26 kg/m as calculated from the following:   Height as of 04/04/22: 5' 2" (1.575 m).   Weight as of 04/04/22: 160 lb (72.6 kg).  LABS: CBC:    Component Value Date/Time   WBC 9.3 04/07/2022 0830  HGB 11.7 (L) 04/07/2022 0830   HCT 35.8 (L) 04/07/2022 0830   PLT 291 04/07/2022 0830   MCV 90.4 04/07/2022 0830   NEUTROABS 7.2 04/07/2022 0830   LYMPHSABS 1.7 04/07/2022 0830   MONOABS 0.3 04/07/2022 0830   EOSABS 0.0 04/07/2022 0830   BASOSABS 0.0 04/07/2022 0830   Comprehensive Metabolic Panel:    Component Value Date/Time   NA 136 04/04/2022 1313   K 3.8 04/04/2022 1313   CL 105 04/04/2022 1313   CO2 19 (L) 04/04/2022 1313   BUN 11 04/04/2022 1313   CREATININE 0.75 04/04/2022 1313   GLUCOSE 136 (H) 04/04/2022 1313   CALCIUM 9.1 04/04/2022 1313   AST 14 (L) 04/04/2022 1313   ALT 11 04/04/2022 1313   ALKPHOS 64 04/04/2022 1313   BILITOT 0.6 04/04/2022 1313   PROT 7.8 04/04/2022 1313   ALBUMIN 3.9 04/04/2022 1313    RADIOGRAPHIC STUDIES: IR Fluoro Guide Ndl Plmt / BX  Result Date: 04/10/2022 CLINICAL DATA:  Persistent severe headache after lumbar puncture FLUOROSCOPY: Radiation Exposure Index (as provided by the fluoroscopic device): 0.6 minutes (59 mGy) PROCEDURE: Epidural blood patch using fluoroscopic guidance The procedure, risks, benefits, and alternatives were explained to the patient. Questions regarding the procedure were  encouraged and answered. The patient understands and consents to the procedure. LUMBAR EPIDURAL INJECTION: An interlaminar approach was performed on left at L3-L4. This corresponded to the same level of access based on comparison imaging from lumbar puncture dated 04/04/2022. The overlying skin was cleansed and anesthetized. A 20 gauge epidural needle was advanced using loss-of-resistance technique. DIAGNOSTIC EPIDURAL INJECTION: Injection of Isovue-M 200 shows a good epidural pattern with spread above and below the level of needle placement, primarily on the midline. No vascular opacification is seen. THERAPEUTIC EPIDURAL INJECTION: Approximately 15 mL of freshly drawn autologous blood was then administered. The procedure was well-tolerated, and the patient was discharged thirty minutes following the injection in good condition. COMPLICATIONS: None immediate IMPRESSION: Technically successful epidural blood patch using fluoroscopic guidance. Electronically Signed   By: Albin Felling M.D.   On: 04/10/2022 12:13   IR FL GUIDED LOC OF NEEDL/CATH TIP FOR SPINAL INJECT LT  Result Date: 04/04/2022 CLINICAL DATA:  Provided history: Anaplastic ALK-positive large-cell lymphoma of lymph node of axilla. Request for lumbar puncture and intrathecal administration of methotrexate, cytarabine and hydrocortisone. EXAM: LUMBAR PUNCTURE UNDER FLUOROSCOPY PROCEDURE: Prior to the procedure, Rushie Nyhan, NP obtained informed consent from the patient. This process included a discussion of procedural risks. An appropriate skin entry site was determined under fluoroscopy and marked. A time-out was performed. The operated donned sterile gloves and a mask. The skin entry site was prepped with Betadine, draped in the usual sterile fashion and infiltrated locally with 1% lidocaine. Subsequently under intermittent fluoroscopic guidance, a 20-gauge spinal needle was advanced into the thecal sac at the L4-L5 level. There was  spontaneous return of clear CSF. 6 mL of CSF were collected for laboratory studies. Subsequently, a chemotherapy solution was injected into the thecal sac (12 mg methotrexate, 100 mg Solu-Cortef, 40 mg cytarabine). The inner stylet was then replaced within the needle and the needle was removed in its entirety. The patient tolerated the procedure well, and no immediate post-procedure complication was apparent. The examination was performed by Rushie Nyhan, NP, and was supervised and interpreted by Dr. Kellie Simmering. FLUOROSCOPY: Fluoroscopy time: 1 minute, 18 seconds (17.5 mGy). IMPRESSION: Technically successful fluoroscopically-guided L4-L5 lumbar puncture. 6 mL of CSF collected for laboratory studies. Intrathecal  injection of chemotherapy and steroid. No immediate post-procedure complication. Electronically Signed   By: Kellie Simmering D.O.   On: 04/04/2022 11:23   ECHOCARDIOGRAM COMPLETE  Result Date: 03/31/2022    ECHOCARDIOGRAM REPORT   Patient Name:   MARIJAH LARRANAGA Date of Exam: 03/31/2022 Medical Rec #:  [id number]          Height:       62.0 in Accession #:    [id number]         Weight:       162.0 lb Date of Birth:  March 21, 1991          BSA:          1.748 m Patient Age:    31 years           BP:           121/71 mmHg Patient Gender: F                  HR:           89 bpm. Exam Location:  [facility] Procedure: 2D Echo, Color Doppler, Cardiac Doppler and Strain Analysis Indications:     Z09 Encounter for monitoring cardiotoxic drug therapy  History:         Patient has prior history of Echocardiogram examinations, most                  recent 02/18/2020. Anaplastic ALK-positive large cell lymphoma                  of lymph node of axilla.  Sonographer:     Charmayne Sheer Referring Phys:  [id number] ZHOU YU Diagnosing Phys: Ida Rogue MD  Sonographer Comments: Tumor on left breast. Global longitudinal strain was attempted. IMPRESSIONS  1. Left ventricular ejection fraction, by estimation, is 60 to 65%. The  left ventricle has normal function. The left ventricle has no regional wall motion abnormalities. Left ventricular diastolic parameters were normal. The average left ventricular global longitudinal strain is -18.9 %.  2. Right ventricular systolic function is normal. The right ventricular size is normal.  3. The mitral valve is normal in structure. Mild mitral valve regurgitation. No evidence of mitral stenosis.  4. The aortic valve was not well visualized. Aortic valve regurgitation is not visualized. No aortic stenosis is present.  5. The inferior vena cava is normal in size with greater than 50% respiratory variability, suggesting right atrial pressure of 3 mmHg. FINDINGS  Left Ventricle: Left ventricular ejection fraction, by estimation, is 60 to 65%. The left ventricle has normal function. The left ventricle has no regional wall motion abnormalities. The average left ventricular global longitudinal strain is -18.9 %. The left ventricular internal cavity size was normal in size. There is no left ventricular hypertrophy. Left ventricular diastolic parameters were normal. Right Ventricle: The right ventricular size is normal. No increase in right ventricular wall thickness. Right ventricular systolic function is normal. Left Atrium: Left atrial size was normal in size. Right Atrium: Right atrial size was normal in size. Pericardium: There is no evidence of pericardial effusion. Mitral Valve: The mitral valve is normal in structure. Mild mitral valve regurgitation. No evidence of mitral valve stenosis. Tricuspid Valve: The tricuspid valve is normal in structure. Tricuspid valve regurgitation is not demonstrated. No evidence of tricuspid stenosis. Aortic Valve: The aortic valve was not well visualized. Aortic valve regurgitation is not visualized. No aortic stenosis is present. Aortic valve mean gradient measures 4.0 mmHg. Aortic valve  peak gradient measures 7.2 mmHg. Aortic valve area, by VTI measures 2.54 cm.  Pulmonic Valve: The pulmonic valve was normal in structure. Pulmonic valve regurgitation is not visualized. No evidence of pulmonic stenosis. Aorta: The aortic root is normal in size and structure. Venous: The inferior vena cava is normal in size with greater than 50% respiratory variability, suggesting right atrial pressure of 3 mmHg. IAS/Shunts: No atrial level shunt detected by color flow Doppler.  LEFT VENTRICLE PLAX 2D LVIDd:         4.70 cm   Diastology LVIDs:         3.10 cm   LV e' medial:    11.60 cm/s LV PW:         1.20 cm   LV E/e' medial:  7.1 LV IVS:        0.80 cm   LV e' lateral:   19.70 cm/s LVOT diam:     2.20 cm   LV E/e' lateral: 4.2 LV SV:         67 LV SV Index:   38        2D Longitudinal Strain LVOT Area:     3.80 cm  2D Strain GLS Avg:     -18.9 %  RIGHT VENTRICLE RV Basal diam:  3.80 cm RV S prime:     14.10 cm/s LEFT ATRIUM             Index        RIGHT ATRIUM           Index LA diam:        3.20 cm 1.83 cm/m   RA Area:     19.50 cm LA Vol (A2C):   29.2 ml 16.71 ml/m  RA Volume:   58.90 ml  33.70 ml/m LA Vol (A4C):   35.7 ml 20.42 ml/m LA Biplane Vol: 34.3 ml 19.62 ml/m  AORTIC VALVE                    PULMONIC VALVE AV Area (Vmax):    2.79 cm     PV Vmax:       1.21 m/s AV Area (Vmean):   2.54 cm     PV Vmean:      78.400 cm/s AV Area (VTI):     2.54 cm     PV VTI:        0.227 m AV Vmax:           134.00 cm/s  PV Peak grad:  5.9 mmHg AV Vmean:          90.400 cm/s  PV Mean grad:  3.0 mmHg AV VTI:            0.263 m AV Peak Grad:      7.2 mmHg AV Mean Grad:      4.0 mmHg LVOT Vmax:         98.20 cm/s LVOT Vmean:        60.300 cm/s LVOT VTI:          0.176 m LVOT/AV VTI ratio: 0.67  AORTA Ao Root diam: 2.70 cm MITRAL VALVE MV Area (PHT): 4.77 cm    SHUNTS MV Decel Time: 159 msec    Systemic VTI:  0.18 m MV E velocity: 82.30 cm/s  Systemic Diam: 2.20 cm MV A velocity: 57.60 cm/s MV E/A ratio:  1.43 Timothy Gollan MD Electronically signed by Timothy Gollan MD Signature Date/Time:  03/31/2022/12:46:09 PM      Final    CT BONE MARROW BIOPSY & ASPIRATION  Result Date: 03/28/2022 CLINICAL DATA:  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 EXAM: CT GUIDED BONE MARROW ASPIRATION AND BIOPSY ANESTHESIA/SEDATION: Moderate (conscious) sedation was employed during this procedure. A total of Versed 2.0 mg and Fentanyl 100 mcg was administered intravenously by radiology nursing. Moderate Sedation Time: 27 minutes. The patient's level of consciousness and vital signs were monitored continuously by radiology nursing throughout the procedure under my direct supervision. PROCEDURE: The procedure risks, benefits, and alternatives were explained to the patient. Questions regarding the procedure were encouraged and answered. The patient understands and consents to the procedure. A time out was performed prior to initiating the procedure. The right gluteal region was prepped with chlorhexidine. Sterile gown and sterile gloves were used for the procedure. Local anesthesia was provided with 1% Lidocaine. Under CT guidance, an 11 gauge On Control bone cutting needle was advanced from a posterior approach into the right iliac bone. Needle positioning was confirmed with CT. Initial non heparinized and heparinized aspirate samples were obtained of bone marrow. Core biopsy was performed via the On Control drill needle. Two separate core biopsy samples were obtained. COMPLICATIONS: None FINDINGS: Inspection of initial non heparinized aspirate for particles was hampered by clotting. For this reason, 2 separate core biopsy samples were obtained. IMPRESSION: CT guided bone marrow biopsy of right posterior iliac bone with both aspirate and core samples obtained. Electronically Signed   By: Aletta Edouard M.D.   On: 03/28/2022 11:07   MR Brain W Wo Contrast  Result Date: 03/26/2022 CLINICAL DATA:  ALK positive large-cell lymphoma EXAM: MRI HEAD WITHOUT AND WITH CONTRAST  TECHNIQUE: Multiplanar, multiecho pulse sequences of the brain and surrounding structures were obtained without and with intravenous contrast. CONTRAST:  7.31m GADAVIST GADOBUTROL 1 MMOL/ML IV SOLN COMPARISON:  None Available. FINDINGS: Brain: No abnormal parenchymal or meningeal enhancement. No restricted diffusion to suggest acute or subacute infarct. No acute hemorrhage, mass, mass effect, or midline shift. No hemosiderin deposition to suggest remote hemorrhage. No hydrocephalus or extra-axial collection. No abnormal T2 signal to suggest demyelinating disease. Vascular: Normal arterial flow voids. Normal arterial and venous enhancement. Skull and upper cervical spine: Normal marrow signal. Sinuses/Orbits: Mucous retention cyst in the left maxillary sinus. The orbits are unremarkable. Other: The mastoids are well aerated. IMPRESSION: No acute intracranial process. No evidence of intracranial metastatic disease. Electronically Signed   By: AMerilyn BabaM.D.   On: 03/26/2022 00:59   NM PET Image Restage (PS) Skull Base to Thigh (F-18 FDG)  Result Date: 03/15/2022 CLINICAL DATA:  Subsequent treatment strategy for anaplastic alk positive large-cell lymphoma. EXAM: NUCLEAR MEDICINE PET SKULL BASE TO THIGH TECHNIQUE: 9.2 mCi F-18 FDG was injected intravenously. Full-ring PET imaging was performed from the skull base to thigh after the radiotracer. CT data was obtained and used for attenuation correction and anatomic localization. Fasting blood glucose: 85 mg/dl COMPARISON:  PET-CT 10/12/2021 FINDINGS: Mediastinal blood pool activity: SUV max 1.6 Liver activity: SUV max 2.0 NECK: Hypermetabolic focus in the RIGHT supraclavicular neck appears to localize to a small node measuring 5 mm image 52/2. Activity is intense for size with SUV max equal 5.7 (image 53 )PET data set Incidental CT findings: None. CHEST: Within the medial LEFT breast there is a new round mass measuring 2.9 cm in diameter and with intense  metabolic activity (SUV max equal 34.3). Two small RIGHT lung nodules are new from prior. 8 mm nodule  on image 91/2 and 5 mm nodule on image 87/2. These nodules appear associated with the horizontal fissure. No faint radiotracer activity associated with the larger nodule Incidental CT findings: None. ABDOMEN/PELVIS: New intense focal activity localizes to the body of the pancreas with SUV max equal 12.2 (image 129. Lesion in the subtly evident on the noncontrast CT measuring approximately 18 mm. Incidental CT findings: None. SKELETON: No focal hypermetabolic activity to suggest skeletal metastasis. Incidental CT findings: None. IMPRESSION: 1. New hypermetabolic round mass in the medial LEFT breast consistent with lymphoma recurrence. 2. New lesion in the mid body of the pancreas consistent with lymphoma recurrence/metastasis. 3. Very small hypermetabolic RIGHT supraclavicular node is concerning for lymphoma. 4. Two pulmonary nodules in the RIGHT lung are new from prior. Indeterminate nodules. Recommend close attention on follow-up. These results will be called to the ordering clinician or representative by the Radiologist Assistant, and communication documented in the PACS or [employer]. Electronically Signed   By: Suzy Bouchard M.D.   On: 03/15/2022 12:33    PERFORMANCE STATUS (ECOG) : 1 - Symptomatic but completely ambulatory  Review of Systems Unless otherwise noted, a complete review of systems is negative.  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  Patient is status post blood patch on Friday with some improvement in symptoms.  No significant improvement with oxycodone.  Discussed with Dr. Tasia Catchings and will try patient on  Fioricet.   Patient expressed understanding and was in agreement with this plan. She also understands that She can call clinic at any time with any questions, concerns, or complaints.   Thank you for allowing me to participate in the care of this very pleasant patient.   Time Total: 15 minutes  Visit consisted of counseling and education dealing with the complex and emotionally intense issues of symptom management in the setting of serious illness.Greater than 50%  of this time was spent counseling and coordinating care related to the above assessment and plan.  Signed by: Altha Harm, PhD, NP-C

## 2022-04-11 ENCOUNTER — Ambulatory Visit: Payer: BC Managed Care – PPO

## 2022-04-12 ENCOUNTER — Inpatient Hospital Stay: Payer: BC Managed Care – PPO

## 2022-04-12 ENCOUNTER — Encounter: Payer: Self-pay | Admitting: Oncology

## 2022-04-12 ENCOUNTER — Inpatient Hospital Stay (HOSPITAL_BASED_OUTPATIENT_CLINIC_OR_DEPARTMENT_OTHER): Payer: BC Managed Care – PPO | Admitting: Oncology

## 2022-04-12 VITALS — BP 125/85 | HR 82 | Temp 98.4°F | Wt 153.7 lb

## 2022-04-12 DIAGNOSIS — C8468 Anaplastic large cell lymphoma, ALK-positive, lymph nodes of multiple sites: Secondary | ICD-10-CM | POA: Diagnosis not present

## 2022-04-12 DIAGNOSIS — C8464 Anaplastic large cell lymphoma, ALK-positive, lymph nodes of axilla and upper limb: Secondary | ICD-10-CM | POA: Diagnosis not present

## 2022-04-12 DIAGNOSIS — G893 Neoplasm related pain (acute) (chronic): Secondary | ICD-10-CM | POA: Diagnosis not present

## 2022-04-12 DIAGNOSIS — T451X5A Adverse effect of antineoplastic and immunosuppressive drugs, initial encounter: Secondary | ICD-10-CM

## 2022-04-12 DIAGNOSIS — R112 Nausea with vomiting, unspecified: Secondary | ICD-10-CM | POA: Diagnosis not present

## 2022-04-12 DIAGNOSIS — G971 Other reaction to spinal and lumbar puncture: Secondary | ICD-10-CM | POA: Diagnosis not present

## 2022-04-12 LAB — CBC WITH DIFFERENTIAL/PLATELET
Abs Immature Granulocytes: 0.1 10*3/uL — ABNORMAL HIGH (ref 0.00–0.07)
Band Neutrophils: 2 %
Basophils Absolute: 0 10*3/uL (ref 0.0–0.1)
Basophils Relative: 0 %
Eosinophils Absolute: 0.1 10*3/uL (ref 0.0–0.5)
Eosinophils Relative: 2 %
HCT: 37.1 % (ref 36.0–46.0)
Hemoglobin: 12.2 g/dL (ref 12.0–15.0)
Lymphocytes Relative: 30 %
Lymphs Abs: 0.8 10*3/uL (ref 0.7–4.0)
MCH: 29.3 pg (ref 26.0–34.0)
MCHC: 32.9 g/dL (ref 30.0–36.0)
MCV: 89.2 fL (ref 80.0–100.0)
Metamyelocytes Relative: 2 %
Monocytes Absolute: 0.1 10*3/uL (ref 0.1–1.0)
Monocytes Relative: 5 %
Myelocytes: 3 %
Neutro Abs: 1.5 10*3/uL — ABNORMAL LOW (ref 1.7–7.7)
Neutrophils Relative %: 56 %
Platelets: 164 10*3/uL (ref 150–400)
RBC: 4.16 MIL/uL (ref 3.87–5.11)
RDW: 12 % (ref 11.5–15.5)
Smear Review: NORMAL
WBC: 2.5 10*3/uL — ABNORMAL LOW (ref 4.0–10.5)
nRBC: 0 % (ref 0.0–0.2)

## 2022-04-12 LAB — COMPREHENSIVE METABOLIC PANEL
ALT: 82 U/L — ABNORMAL HIGH (ref 0–44)
AST: 32 U/L (ref 15–41)
Albumin: 4.1 g/dL (ref 3.5–5.0)
Alkaline Phosphatase: 57 U/L (ref 38–126)
Anion gap: 8 (ref 5–15)
BUN: 11 mg/dL (ref 6–20)
CO2: 24 mmol/L (ref 22–32)
Calcium: 9.1 mg/dL (ref 8.9–10.3)
Chloride: 104 mmol/L (ref 98–111)
Creatinine, Ser: 0.65 mg/dL (ref 0.44–1.00)
GFR, Estimated: >60 mL/min (ref >60)
Glucose, Bld: 108 mg/dL — ABNORMAL HIGH (ref 70–99)
Potassium: 3.9 mmol/L (ref 3.5–5.1)
Sodium: 136 mmol/L (ref 135–145)
Total Bilirubin: 0.7 mg/dL (ref 0.3–1.2)
Total Protein: 7.8 g/dL (ref 6.5–8.1)

## 2022-04-12 MED ORDER — CHLORHEXIDINE GLUCONATE 0.12 % MT SOLN: 5.0000 mL | Freq: Two times a day (BID) | OROMUCOSAL | 0 refills | Status: AC

## 2022-04-12 MED ORDER — SODIUM CHLORIDE 0.9 % IV SOLN
Freq: Once | INTRAVENOUS | Status: AC
  Filled 2022-04-12: qty 250

## 2022-04-12 MED ORDER — DOCUSATE SODIUM 100 MG PO CAPS: 100.0000 mg | ORAL_CAPSULE | Freq: Two times a day (BID) | ORAL | 0 refills | Status: DC

## 2022-04-12 NOTE — Assessment & Plan Note (Addendum)
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 CSF flowcytometry pending.  Recommend salvage therapy bridging to allogenic transplant S/p cycle 1 ICE with GCSF support. Day 1 intrathecal triple therapy with MTX 12mg, Ara C 40mg and hydrocortisone 100 mg Day 2 Ifosfamide, Etoposide, [place] Day 3 Ifosfamide, Etoposide, Carboplatin, [place] Day 4 Ifosfamide, Etoposide, [place] Day 7 GCSF + IVF hydration.   She tolerated chemotherapy with moderate difficulties, proceed with IVF NS today for hydration.  Patient elects to get medi port placement, referral sent to vascular surgery  

## 2022-04-12 NOTE — Assessment & Plan Note (Signed)
Continue Oxycodone '5mg'$  PRN as instructed.

## 2022-04-12 NOTE — Assessment & Plan Note (Signed)
Recommend antimetics, Zofran, Compazine PRN, Ativan PRN as instructed

## 2022-04-12 NOTE — H&P (View-Only) (Signed)
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 CSF flowcytometry pending.  Recommend salvage therapy bridging to allogenic transplant S/p cycle 1 ICE with GCSF support. Day 1 intrathecal triple therapy with MTX 12mg, Ara C 40mg and hydrocortisone 100 mg Day 2 Ifosfamide, Etoposide, [place] Day 3 Ifosfamide, Etoposide, Carboplatin, [place] Day 4 Ifosfamide, Etoposide, Mesna Day 7 GCSF + IVF hydration.   She tolerated chemotherapy with moderate difficulties, proceed with IVF NS today for hydration.  Patient elects to get medi port placement, referral sent to vascular surgery   Chemotherapy induced nausea and vomiting Recommend antimetics, Zofran, Compazine PRN, Ativan PRN as instructed  Headache after spinal puncture S/p blood patch Obtain CT head wo for evaluation.  Continue supportive care  Neoplasm related pain Continue Oxycodone 5mg PRN as instructed.   Orders Placed This Encounter  Procedures   CT HEAD WO CONTRAST (5MM)    To be done this week if possible    Standing Status:   Future    Standing Expiration Date:   04/13/2023    Order Specific Question:   Is patient pregnant?    Answer:   No    Order Specific Question:   Preferred imaging  location?    Answer:   [facility]   CBC with Differential/Platelet    Standing Status:   Future    Number of Occurrences:   1    Standing Expiration Date:   04/12/2023   Comprehensive metabolic panel    Standing Status:   Future    Number of Occurrences:   1    Standing Expiration Date:   04/12/2023   Follow-up per LOS    All questions were answered. The patient knows to call the clinic with any problems, questions or concerns.  Toribio Seiber, MD, PhD [facility] 04/12/2022     CHIEF COMPLAINTS/REASON FOR VISIT:  Follow up for anaplastic large cell lymphoma, ALK postive  HISTORY OF PRESENTING ILLNESS:   Angel Tate is a  31 y.o.  female presents for follow-up of ALK positive anaplastic large cell lymphoma Oncology history summary is listed below. 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  Initially the mass did not bother her.  Patient developed a rash in the right axillary/upper outer quadrant of the breast and had additional work-up.  Patient was treated with a 10-day course of doxycycline for possible infection.  Did not improve.  #02/04/2020,   targeted ultrasound showed right axillary mass measuring 6.2 x 4 by 4 cm.  There are 2 adjacent smaller axillary mass measuring 2.0 x 1.2 x 1.4 cm and 0.8 x 0.7 x 0.9 cm.  Mammogram showed dense fibroglandular.  No suspicious mass or malignant type microcalcifications or distortion detected in either breast.  # Patient underwent ultrasound-guided core biopsy of the right axillary  mass. 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  Ki-67 80-90%, Flow cytometry has insufficient cells for analysis.    02/17/2020 Bone Marrow Biopsy   Bone marrow biopsy is negative   02/18/2020 Echocardiogram   Echocardiogram showed normal LVEF   03/02/2020 Imaging   PET scan showed right axillary pathological adenopathy, largest lymph node 3.5 cm with maximum SUV 42.8.  An adjacent smaller peripheral right axillary lymph node measuring 1.3 cm with SUV of 23.3. Anterior mediastinal density favors benign thymic tissue maximum SUV 3.0 which is due [place] 4, far below the level of last has lymphadenopathy.   03/03/2020 - 05/30/2020 Chemotherapy   03/03/2020 - 05/30/2020 4 cycles of BV AVD Q21 days.   Patient does not desire to preserve fertility.  She has IUD for contraceptive measures.   05/20/2020 Imaging   PET showed complete response   06/02/2020 - 06/25/2020 Chemotherapy   2 cycles of BV AVD Q21 days   09/01/2020 Imaging   PET scan  1. No signs to suggest residual or recurrent FDG avid tumor.2. Resolution of previously noted generalized marrow hypermetabolism.3. FDG avid brown fat noted within the neck and chest compatible with benign physiologic activity.   09/13/2020 Procedure   Her Mediport was removed by Dr. Dew.   03/01/2021 Imaging   Surveillance PET showed No evidence of active lymphoma.   10/13/2021 Imaging   Surveillance PET showed  1. No evidence of lymphoma recurrence. No lymphadenopathy. Normal spleen and bone marrow.2. Particular attention directed to the RIGHT axilla.3. Benign cyst of the LEFT ovary   03/03/2022 Mammogram   Patient palpated left breast mass with rapid growth.   Bilateral diagnostic mammogram/ left axilla  showed 1. Suspicious 2.4 cm mass along the 11 o'clock axis of the left breast. Recommend ultrasound-guided biopsy. 2. No suspicious left axillary lymphadenopathy. 3. No mammographic evidence of malignancy on the right.    03/06/2022 Relapse/Recurrence   Left breast mass biopsy showed ALK positive anaplastic large cell lymphoma. Ki67 100% Flowcytometry is overall non contributory.    03/15/2022 Imaging   PET restaging showed  1. New hypermetabolic round mass in the medial LEFT breast consistent with lymphoma recurrence. 2. New lesion in the mid body of the pancreas consistent with lymphoma recurrence/metastasis. 3. Very small hypermetabolic RIGHT supraclavicular node is concerning for lymphoma. 4. Two pulmonary nodules in the RIGHT lung are new from prior. Indeterminate nodules.    03/21/2022 Cancer Staging   Staging form: Hodgkin and Non-Hodgkin Lymphoma, AJCC 8th Edition - Clinical stage from 03/21/2022: Stage IV - Signed by Basya Casavant, MD on 04/08/2022 Histopathologic type: Anaplastic large cell lymphoma, T cell and Null cell type Stage prefix: Recurrence   03/28/2022 Echocardiogram   2D Echo LVEF 60-65%   03/28/2022 Bone Marrow Biopsy   Bone marrow biopsy showed  Aspirate : no immunophenotypic evidence of a lymphoproliferative disorder (no monoclonal B cells or immunophenotypically abnormal T cells detected).  Core biopsy showed Hypocellular bone marrow (30%) with otherwise orderly trilineage hematopoiesis. -No morphologic or immunohistochemical evidence of the patient's known  recurrent anaplastic large cell lymphoma.        04/04/2022 -  Chemotherapy   Patient is on Treatment Plan : NON-HODGKINS LYMPHOMA ICE q21d   Intrathecal with MTX 87m, Ara C 454mand hydrocortisone 100 mg    04/04/2022 Procedure   Lumbar puncture   CSF flowcytometry  Insufficient B and T cells detected to adequately evaluate for clonality or aberrancy in a low cellularity specimen,      Patient presents  post  chemotherapy evaluation.  [facility]. S/p blood patch by IR due to persistent headache and neck pain. She feels he symptom slightly improved, but  persists.  Today she feels much better.  Continues on oxycodone PRN. She tried a few doses of Fioricet.  Manageable nausea and vomiting.   Left breast mass has decreased in size, open wound is almost healed.    Review of Systems  Constitutional:  Positive for appetite change. Negative for chills, fatigue, fever and unexpected weight change.  HENT:   Negative for hearing loss and voice change.   Eyes:  Negative for eye problems.  Respiratory:  Negative for chest tightness, cough and shortness of breath.   Cardiovascular:  Negative for chest pain.  Gastrointestinal:  Positive for nausea and vomiting. Negative for abdominal distention, abdominal pain and blood in stool.  Endocrine: Negative for hot flashes.  Genitourinary:  Negative for difficulty urinating and frequency.   Musculoskeletal:  Negative for arthralgias.       Neck pain  Skin:  Negative for itching and rash.  Neurological:  Positive for headaches. Negative for extremity weakness.  Hematological:  Negative for adenopathy.  Psychiatric/Behavioral:  Negative for confusion.     MEDICAL HISTORY:  Past Medical History:  Diagnosis Date   Anxiety    Depression    Medical history non-contributory     SURGICAL HISTORY: Past Surgical History:  Procedure Laterality Date   BREAST BIOPSY Left 03/06/2022   [place] BREAST BX W LOC DEV 1ST LESION IMG BX SPEC [place] 03/06/2022 [facility]MO[facility]   DILATION AND CURETTAGE OF UTERUS     DILATION AND EVACUATION N/A 02/17/2017   Procedure: DILATATION AND EVACUATION;  Surgeon: OzJanyth PupaDO;  [facility];  Service: Gynecology;  Laterality: N/A;   IR FL GUIDED LOC OF NEEDLE/CATH TIP FOR SPINAL INJECTION LT  04/04/2022   IR FLUORO GUIDED NEEDLE PLC ASPIRATION/INJECTION LOC  04/07/2022   PORTA CATH INSERTION N/A 02/23/2020   Procedure: PORTA CATH INSERTION;  Surgeon: DeAlgernon HuxleyMD;  [facility];  Service: Cardiovascular;  Laterality: N/A;   PORTA CATH REMOVAL  N/A 09/13/2020   Procedure: PORTA CATH REMOVAL;  Surgeon: DeAlgernon HuxleyMD;  [facility];  Service: Cardiovascular;  Laterality: N/A;    SOCIAL HISTORY: Social History   Socioeconomic History   Marital status: Married    Spouse name: Cody   Number of children: 1   Years of education: Not on file   Highest education level: Not on file  Occupational History   Occupation: social media- marketing   Tobacco Use   Smoking status: Never   Smokeless tobacco: Never  Vaping Use   Vaping Use: Never used  Substance and Sexual Activity   Alcohol use: Yes    Comment: socially   Drug use: No   Sexual activity: Yes    Birth control/protection: None  Other Topics Concern   Not on file  Social History Narrative   Not on file  Social Determinants of Health   Financial Resource Strain: Low Risk  (01/16/2019)   Overall Financial Resource Strain (CARDIA)    Difficulty of Paying Living Expenses: Not hard at all  Food Insecurity: No Food Insecurity (01/16/2019)   Hunger Vital Sign    Worried About Running Out of Food in the Last Year: Never true    Ran Out of Food in the Last Year: Never true  Transportation Needs: No Transportation Needs (01/16/2019)   PRAPARE - Transportation    Lack of Transportation (Medical): No    Lack of Transportation (Non-Medical): No  Physical Activity: Unknown (01/19/2019)   Exercise Vital Sign    Days of Exercise per Week: Patient refused    Minutes of Exercise per Session: Patient refused  Stress: No Stress Concern Present (01/19/2019)   [employer] - Occupational Stress Questionnaire    Feeling of Stress : Only a little  Social Connections: Unknown (01/19/2019)   Social Connection and Isolation Panel [NHANES]    Frequency of Communication with Friends and Family: Patient refused    Frequency of Social Gatherings with Friends and Family: Patient refused    Attends Religious Services: Patient refused    Active  Member of Clubs or Organizations: Patient refused    Attends Club or Organization Meetings: Patient refused    Marital Status: Patient refused  Intimate Partner Violence: Not At Risk (01/19/2019)   Humiliation, Afraid, Rape, and Kick questionnaire    Fear of Current or Ex-Partner: No    Emotionally Abused: No    Physically Abused: No    Sexually Abused: No    FAMILY HISTORY: Family History  Problem Relation Age of Onset   Asthma Maternal Aunt    Hypertension Maternal Grandmother    Ovarian cancer Paternal Grandmother        dx 70s-80s    ALLERGIES:  has No Known Allergies.  MEDICATIONS:  Current Outpatient Medications  Medication Sig Dispense Refill   acetaminophen (TYLENOL) 500 MG tablet Take 500 mg by mouth every 6 (six) hours as needed.     allopurinol (ZYLOPRIM) 300 MG tablet Take 1 tablet (300 mg total) by mouth daily. 30 tablet 2   butalbital-acetaminophen-caffeine (FIORICET) 50-325-40 MG tablet Take 1 tablet by mouth every 6 (six) hours as needed for headache. 20 tablet 0   chlorhexidine (PERIDEX) 0.12 % solution Use as directed 5 mLs in the mouth or throat 2 (two) times daily. 473 mL 0   dexamethasone (DECADRON) 4 MG tablet Take 2 tablets (8 mg total) by mouth daily. Start the day after chemotherapy for 2 days. Take with food. 30 tablet 1   docusate sodium (COLACE) 100 MG capsule Take 1 capsule (100 mg total) by mouth 2 (two) times daily. 10 capsule 0   LORazepam (ATIVAN) 0.5 MG tablet Take 1 tablet (0.5 mg total) by mouth every 6 (six) hours as needed for anxiety. 60 tablet 0   naloxone (NARCAN) nasal spray 4 mg/0.1 mL SPRAY 1 SPRAY INTO ONE NOSTRIL AS DIRECTED FOR OPIOID OVERDOSE (TURN PERSON ON SIDE AFTER DOSE. IF NO RESPONSE IN 2-3 MINUTES OR PERSON RESPONDS BUT RELAPSES, REPEAT USING A NEW SPRAY DEVICE AND SPRAY INTO THE OTHER NOSTRIL. CALL 911 AFTER USE.) * EMERGENCY USE ONLY * 1 each 0   oxyCODONE (OXY IR/ROXICODONE) 5 MG immediate release tablet Take 1-2 tablets (5-10  mg total) by mouth every 6 (six) hours as needed for severe pain. 90 tablet 0   prochlorperazine (COMPAZINE) 10 MG tablet   Take 1 tablet (10 mg total) by mouth every 6 (six) hours as needed for nausea or vomiting. 30 tablet 1   escitalopram (LEXAPRO) 10 MG tablet Take 10 mg by mouth daily. (Patient not taking: Reported on 03/28/2022)     escitalopram (LEXAPRO) 5 MG tablet Take 5 mg by mouth daily. (Patient not taking: Reported on 03/21/2022)     Levonorgestrel (SKYLA) 13.5 MG IUD  (Patient not taking: Reported on 03/28/2022)     loperamide (IMODIUM) 2 MG capsule Take 1 capsule (2 mg total) by mouth See admin instructions. Initial: 4 mg, followed by 2 mg every 2 to 4 hours or after each loose stool; Max 16 mg /24 hours (Patient not taking: Reported on 09/13/2020) 60 capsule 1   ondansetron (ZOFRAN) 8 MG tablet Take 1 tablet (8 mg total) by mouth every 8 (eight) hours as needed for nausea or vomiting. Start 2 days after chemotherapy. (Patient not taking: Reported on 03/31/2022) 30 tablet 1   promethazine (PHENERGAN) 25 MG tablet Take 1 tablet (25 mg total) by mouth every 6 (six) hours as needed for nausea or vomiting. (Patient not taking: Reported on 09/03/2020) 120 tablet 2   venlafaxine (EFFEXOR) 37.5 MG tablet TAKE 1 TABLET BY MOUTH EVERY DAY (Patient not taking: Reported on 04/12/2022) 90 tablet 1   No current facility-administered medications for this visit.     PHYSICAL EXAMINATION: ECOG PERFORMANCE STATUS: 1 - Symptomatic but completely ambulatory Vitals:   04/12/22 0841  BP: 125/85  Pulse: 82  Temp: 98.4 F (36.9 C)  SpO2: 100%   Filed Weights   04/12/22 0841  Weight: 153 lb 11.2 oz (69.7 kg)     Physical Exam Constitutional:      General: She is not in acute distress. HENT:     Head: Normocephalic and atraumatic.  Eyes:     General: No scleral icterus. Cardiovascular:     Rate and Rhythm: Normal rate and regular rhythm.  Pulmonary:     Effort: Pulmonary effort is normal. No  respiratory distress.     Breath sounds: No wheezing.  Abdominal:     General: Bowel sounds are normal. There is no distension.     Palpations: Abdomen is soft.  Musculoskeletal:        General: No deformity. Normal range of motion.     Cervical back: Normal range of motion and neck supple.  Skin:    General: Skin is warm and dry.     Findings: Lesion present.  Neurological:     Mental Status: She is alert and oriented to person, place, and time. Mental status is at baseline.     Cranial Nerves: No cranial nerve deficit.     Coordination: Coordination normal.  Psychiatric:        Mood and Affect: Mood normal.   04/04/22   LABORATORY DATA:  I have reviewed the data as listed     Latest Ref Rng & Units 04/12/2022    8:30 AM 04/07/2022    8:30 AM 04/06/2022    8:40 AM  CBC  WBC 4.0 - 10.5 K/uL 2.5  9.3  10.1   Hemoglobin 12.0 - 15.0 g/dL 12.2  11.7  12.0   Hematocrit 36.0 - 46.0 % 37.1  35.8  35.6   Platelets 150 - 400 K/uL 164  291  306       Latest Ref Rng & Units 04/12/2022    8:30 AM 04/04/2022    1:13 PM 03/28/2022  8:05 AM  CMP  Glucose 70 - 99 mg/dL 108  136  105   BUN 6 - 20 mg/dL 11  11  9   Creatinine 0.44 - 1.00 mg/dL 0.65  0.75  0.66   Sodium 135 - 145 mmol/L 136  136  137   Potassium 3.5 - 5.1 mmol/L 3.9  3.8  4.5   Chloride 98 - 111 mmol/L 104  105  105   CO2 22 - 32 mmol/L 24  19  26   Calcium 8.9 - 10.3 mg/dL 9.1  9.1  9.1   Total Protein 6.5 - 8.1 g/dL 7.8  7.8  7.5   Total Bilirubin 0.3 - 1.2 mg/dL 0.7  0.6  0.3   Alkaline Phos 38 - 126 U/L 57  64  60   AST 15 - 41 U/L 32  14  15   ALT 0 - 44 U/L 82  11  9     Iron/TIBC/Ferritin/ %Sat No results found for: "IRON", "TIBC", "FERRITIN", "IRONPCTSAT"    RADIOGRAPHIC STUDIES: I have personally reviewed the radiological images as listed and agreed with the findings in the report. IR Fluoro Guide Ndl Plmt / BX  Result Date: 04/10/2022 CLINICAL DATA:  Persistent severe headache after lumbar  puncture FLUOROSCOPY: Radiation Exposure Index (as provided by the fluoroscopic device): 0.6 minutes (59 mGy) PROCEDURE: Epidural blood patch using fluoroscopic guidance The procedure, risks, benefits, and alternatives were explained to the patient. Questions regarding the procedure were encouraged and answered. The patient understands and consents to the procedure. LUMBAR EPIDURAL INJECTION: An interlaminar approach was performed on left at L3-L4. This corresponded to the same level of access based on comparison imaging from lumbar puncture dated 04/04/2022. The overlying skin was cleansed and anesthetized. A 20 gauge epidural needle was advanced using loss-of-resistance technique. DIAGNOSTIC EPIDURAL INJECTION: Injection of Isovue-M 200 shows a good epidural pattern with spread above and below the level of needle placement, primarily on the midline. No vascular opacification is seen. THERAPEUTIC EPIDURAL INJECTION: Approximately 15 mL of freshly drawn autologous blood was then administered. The procedure was well-tolerated, and the patient was discharged thirty minutes following the injection in good condition. COMPLICATIONS: None immediate IMPRESSION: Technically successful epidural blood patch using fluoroscopic guidance. Electronically Signed   By: Yasser  El-Abd M.D.   On: 04/10/2022 12:13   IR FL GUIDED LOC OF NEEDL/CATH TIP FOR SPINAL INJECT LT  Result Date: 04/04/2022 CLINICAL DATA:  Provided history: Anaplastic ALK-positive large-cell lymphoma of lymph node of axilla. Request for lumbar puncture and intrathecal administration of methotrexate, cytarabine and hydrocortisone. EXAM: LUMBAR PUNCTURE UNDER FLUOROSCOPY PROCEDURE: Prior to the procedure, Jennifer Omohundro, NP obtained informed consent from the patient. This process included a discussion of procedural risks. An appropriate skin entry site was determined under fluoroscopy and marked. A time-out was performed. The operated donned sterile gloves  and a mask. The skin entry site was prepped with Betadine, draped in the usual sterile fashion and infiltrated locally with 1% lidocaine. Subsequently under intermittent fluoroscopic guidance, a 20-gauge spinal needle was advanced into the thecal sac at the L4-L5 level. There was spontaneous return of clear CSF. 6 mL of CSF were collected for laboratory studies. Subsequently, a chemotherapy solution was injected into the thecal sac (12 mg methotrexate, 100 mg Solu-Cortef, 40 mg cytarabine). The inner stylet was then replaced within the needle and the needle was removed in its entirety. The patient tolerated the procedure well, and no immediate post-procedure complication was apparent. The   examination was performed by Jennifer Omohundro, NP, and was supervised and interpreted by Dr. Kyle Golden. FLUOROSCOPY: Fluoroscopy time: 1 minute, 18 seconds (17.5 mGy). IMPRESSION: Technically successful fluoroscopically-guided L4-L5 lumbar puncture. 6 mL of CSF collected for laboratory studies. Intrathecal injection of chemotherapy and steroid. No immediate post-procedure complication. Electronically Signed   By: Kyle  Golden D.O.   On: 04/04/2022 11:23   ECHOCARDIOGRAM COMPLETE  Result Date: 03/31/2022    ECHOCARDIOGRAM REPORT   Patient Name:   Latonga Erber Date of Exam: 03/31/2022 Medical Rec #:  [id number]          Height:       62.0 in Accession #:    [id number]         Weight:       162.0 lb Date of Birth:  12/26/1990          BSA:          1.748 m Patient Age:    31 years           BP:           121/71 mmHg Patient Gender: F                  HR:           89 bpm. Exam Location:  [facility] Procedure: 2D Echo, Color Doppler, Cardiac Doppler and Strain Analysis Indications:     Z09 Encounter for monitoring cardiotoxic drug therapy  History:         Patient has prior history of Echocardiogram examinations, most                  recent 02/18/2020. Anaplastic ALK-positive large cell lymphoma                  of lymph node of  axilla.  Sonographer:     Joan Heiss Referring Phys:  [id number] Aunica Dauphinee Diagnosing Phys: Timothy Gollan MD  Sonographer Comments: Tumor on left breast. Global longitudinal strain was attempted. IMPRESSIONS  1. Left ventricular ejection fraction, by estimation, is 60 to 65%. The left ventricle has normal function. The left ventricle has no regional wall motion abnormalities. Left ventricular diastolic parameters were normal. The average left ventricular global longitudinal strain is -18.9 %.  2. Right ventricular systolic function is normal. The right ventricular size is normal.  3. The mitral valve is normal in structure. Mild mitral valve regurgitation. No evidence of mitral stenosis.  4. The aortic valve was not well visualized. Aortic valve regurgitation is not visualized. No aortic stenosis is present.  5. The inferior vena cava is normal in size with greater than 50% respiratory variability, suggesting right atrial pressure of 3 mmHg. FINDINGS  Left Ventricle: Left ventricular ejection fraction, by estimation, is 60 to 65%. The left ventricle has normal function. The left ventricle has no regional wall motion abnormalities. The average left ventricular global longitudinal strain is -18.9 %. The left ventricular internal cavity size was normal in size. There is no left ventricular hypertrophy. Left ventricular diastolic parameters were normal. Right Ventricle: The right ventricular size is normal. No increase in right ventricular wall thickness. Right ventricular systolic function is normal. Left Atrium: Left atrial size was normal in size. Right Atrium: Right atrial size was normal in size. Pericardium: There is no evidence of pericardial effusion. Mitral Valve: The mitral valve is normal in structure. Mild mitral valve regurgitation. No evidence of mitral valve stenosis. Tricuspid Valve: The tricuspid valve is normal in   structure. Tricuspid valve regurgitation is not demonstrated. No evidence of tricuspid  stenosis. Aortic Valve: The aortic valve was not well visualized. Aortic valve regurgitation is not visualized. No aortic stenosis is present. Aortic valve mean gradient measures 4.0 mmHg. Aortic valve peak gradient measures 7.2 mmHg. Aortic valve area, by VTI measures 2.54 cm. Pulmonic Valve: The pulmonic valve was normal in structure. Pulmonic valve regurgitation is not visualized. No evidence of pulmonic stenosis. Aorta: The aortic root is normal in size and structure. Venous: The inferior vena cava is normal in size with greater than 50% respiratory variability, suggesting right atrial pressure of 3 mmHg. IAS/Shunts: No atrial level shunt detected by color flow Doppler.  LEFT VENTRICLE PLAX 2D LVIDd:         4.70 cm   Diastology LVIDs:         3.10 cm   LV e' medial:    11.60 cm/s LV PW:         1.20 cm   LV E/e' medial:  7.1 LV IVS:        0.80 cm   LV e' lateral:   19.70 cm/s LVOT diam:     2.20 cm   LV E/e' lateral: 4.2 LV SV:         67 LV SV Index:   38        2D Longitudinal Strain LVOT Area:     3.80 cm  2D Strain GLS Avg:     -18.9 %  RIGHT VENTRICLE RV Basal diam:  3.80 cm RV S prime:     14.10 cm/s LEFT ATRIUM             Index        RIGHT ATRIUM           Index LA diam:        3.20 cm 1.83 cm/m   RA Area:     19.50 cm LA Vol (A2C):   29.2 ml 16.71 ml/m  RA Volume:   58.90 ml  33.70 ml/m LA Vol (A4C):   35.7 ml 20.42 ml/m LA Biplane Vol: 34.3 ml 19.62 ml/m  AORTIC VALVE                    PULMONIC VALVE AV Area (Vmax):    2.79 cm     PV Vmax:       1.21 m/s AV Area (Vmean):   2.54 cm     PV Vmean:      78.400 cm/s AV Area (VTI):     2.54 cm     PV VTI:        0.227 m AV Vmax:           134.00 cm/s  PV Peak grad:  5.9 mmHg AV Vmean:          90.400 cm/s  PV Mean grad:  3.0 mmHg AV VTI:            0.263 m AV Peak Grad:      7.2 mmHg AV Mean Grad:      4.0 mmHg LVOT Vmax:         98.20 cm/s LVOT Vmean:        60.300 cm/s LVOT VTI:          0.176 m LVOT/AV VTI ratio: 0.67  AORTA Ao Root diam:  2.70 cm MITRAL VALVE MV Area (PHT): 4.77 cm    SHUNTS MV Decel Time: 159 msec      Systemic VTI:  0.18 m MV E velocity: 82.30 cm/s  Systemic Diam: 2.20 cm MV A velocity: 57.60 cm/s MV E/A ratio:  1.43 Timothy Gollan MD Electronically signed by Timothy Gollan MD Signature Date/Time: 03/31/2022/12:46:09 PM    Final    CT BONE MARROW BIOPSY & ASPIRATION  Result Date: 03/28/2022 CLINICAL DATA:  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 EXAM: CT GUIDED BONE MARROW ASPIRATION AND BIOPSY ANESTHESIA/SEDATION: Moderate (conscious) sedation was employed during this procedure. A total of Versed 2.0 mg and Fentanyl 100 mcg was administered intravenously by radiology nursing. Moderate Sedation Time: 27 minutes. The patient's level of consciousness and vital signs were monitored continuously by radiology nursing throughout the procedure under my direct supervision. PROCEDURE: The procedure risks, benefits, and alternatives were explained to the patient. Questions regarding the procedure were encouraged and answered. The patient understands and consents to the procedure. A time out was performed prior to initiating the procedure. The right gluteal region was prepped with chlorhexidine. Sterile gown and sterile gloves were used for the procedure. Local anesthesia was provided with 1% Lidocaine. Under CT guidance, an 11 gauge On Control bone cutting needle was advanced from a posterior approach into the right iliac bone. Needle positioning was confirmed with CT. Initial non heparinized and heparinized aspirate samples were obtained of bone marrow. Core biopsy was performed via the On Control drill needle. Two separate core biopsy samples were obtained. COMPLICATIONS: None FINDINGS: Inspection of initial non heparinized aspirate for particles was hampered by clotting. For this reason, 2 separate core biopsy samples were obtained. IMPRESSION: CT guided bone marrow biopsy of right  posterior iliac bone with both aspirate and core samples obtained. Electronically Signed   By: Glenn  Yamagata M.D.   On: 03/28/2022 11:07   MR Brain W Wo Contrast  Result Date: 03/26/2022 CLINICAL DATA:  ALK positive large-cell lymphoma EXAM: MRI HEAD WITHOUT AND WITH CONTRAST TECHNIQUE: Multiplanar, multiecho pulse sequences of the brain and surrounding structures were obtained without and with intravenous contrast. CONTRAST:  7.5mL GADAVIST GADOBUTROL 1 MMOL/ML IV SOLN COMPARISON:  None Available. FINDINGS: Brain: No abnormal parenchymal or meningeal enhancement. No restricted diffusion to suggest acute or subacute infarct. No acute hemorrhage, mass, mass effect, or midline shift. No hemosiderin deposition to suggest remote hemorrhage. No hydrocephalus or extra-axial collection. No abnormal T2 signal to suggest demyelinating disease. Vascular: Normal arterial flow voids. Normal arterial and venous enhancement. Skull and upper cervical spine: Normal marrow signal. Sinuses/Orbits: Mucous retention cyst in the left maxillary sinus. The orbits are unremarkable. Other: The mastoids are well aerated. IMPRESSION: No acute intracranial process. No evidence of intracranial metastatic disease. Electronically Signed   By: Alison  Vasan M.D.   On: 03/26/2022 00:59   NM PET Image Restage (PS) Skull Base to Thigh (F-18 FDG)  Result Date: 03/15/2022 CLINICAL DATA:  Subsequent treatment strategy for anaplastic alk positive large-cell lymphoma. EXAM: NUCLEAR MEDICINE PET SKULL BASE TO THIGH TECHNIQUE: 9.2 mCi F-18 FDG was injected intravenously. Full-ring PET imaging was performed from the skull base to thigh after the radiotracer. CT data was obtained and used for attenuation correction and anatomic localization. Fasting blood glucose: 85 mg/dl COMPARISON:  PET-CT 10/12/2021 FINDINGS: Mediastinal blood pool activity: SUV max 1.6 Liver activity: SUV max 2.0 NECK: Hypermetabolic focus in the RIGHT supraclavicular neck  appears to localize to a small node measuring 5 mm image 52/2. Activity is intense for size with SUV max equal 5.7 (image 53 )PET data set Incidental   CT findings: None. CHEST: Within the medial LEFT breast there is a new round mass measuring 2.9 cm in diameter and with intense metabolic activity (SUV max equal 34.3). Two small RIGHT lung nodules are new from prior. 8 mm nodule on image 91/2 and 5 mm nodule on image 87/2. These nodules appear associated with the horizontal fissure. No faint radiotracer activity associated with the larger nodule Incidental CT findings: None. ABDOMEN/PELVIS: New intense focal activity localizes to the body of the pancreas with SUV max equal 12.2 (image 129. Lesion in the subtly evident on the noncontrast CT measuring approximately 18 mm. Incidental CT findings: None. SKELETON: No focal hypermetabolic activity to suggest skeletal metastasis. Incidental CT findings: None. IMPRESSION: 1. New hypermetabolic round mass in the medial LEFT breast consistent with lymphoma recurrence. 2. New lesion in the mid body of the pancreas consistent with lymphoma recurrence/metastasis. 3. Very small hypermetabolic RIGHT supraclavicular node is concerning for lymphoma. 4. Two pulmonary nodules in the RIGHT lung are new from prior. Indeterminate nodules. Recommend close attention on follow-up. These results will be called to the ordering clinician or representative by the Radiologist Assistant, and communication documented in the PACS or [facility] Dashboard. Electronically Signed   By: Stewart  Edmunds M.D.   On: 03/15/2022 12:33      

## 2022-04-12 NOTE — Progress Notes (Signed)
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 CSF flowcytometry pending.  Recommend salvage therapy bridging to allogenic transplant S/p cycle 1 ICE with GCSF support. Day 1 intrathecal triple therapy with MTX 63m, Ara C 491mand hydrocortisone 100 mg Day 2 Ifosfamide, Etoposide, [place] Day 3 Ifosfamide, Etoposide, Carboplatin, [place] Day 4 Ifosfamide, Etoposide, [place] Day 7 GCSF + IVF hydration.   She tolerated chemotherapy with moderate difficulties, proceed with IVF NS today for hydration.  Patient elects to get medi port placement, referral sent to vascular surgery   Chemotherapy induced nausea and vomiting Recommend antimetics, Zofran, Compazine PRN, Ativan PRN as instructed  Headache after spinal puncture S/p blood patch Obtain CT head wo for evaluation.  Continue supportive care  Neoplasm related pain Continue Oxycodone 51m66mRN as instructed.   Orders Placed This Encounter  Procedures   CT HEAD WO CONTRAST (5MM)    To be done this week if possible    Standing Status:   Future    Standing Expiration Date:   04/13/2023    Order Specific Question:   Is patient pregnant?    Answer:   No    Order Specific Question:   Preferred imaging  location?    Answer:   [facility]   CBC with Differential/Platelet    Standing Status:   Future    Number of Occurrences:   1    Standing Expiration Date:   04/12/2023   Comprehensive metabolic panel    Standing Status:   Future    Number of Occurrences:   1    Standing Expiration Date:   04/12/2023   Follow-up per LOS    All questions were answered. The patient knows to call the clinic with any problems, questions or concerns.  ZhoEarlie ServerD, PhD [facility] 04/12/2022     CHIEF COMPLAINTS/REASON FOR VISIT:  Follow up for anaplastic large cell lymphoma, ALK postive  HISTORY OF PRESENTING ILLNESS:   Angel Tate a  [age]o.  female presents for follow-up of ALK positive anaplastic large cell lymphoma Oncology history summary is listed below. 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  Initially the mass did not bother her.  Patient developed a rash in the right axillary/upper outer quadrant of the breast and had additional work-up.  Patient was treated with a 10-day course of doxycycline for possible infection.  Did not improve.  #02/04/2020,  targeted ultrasound showed right axillary mass measuring 6.2 x 4 by 4 cm.  There are 2 adjacent smaller axillary mass measuring 2.0 x 1.2 x 1.4 cm and 0.8 x 0.7 x 0.9 cm.  Mammogram showed dense fibroglandular.  No suspicious mass or malignant type microcalcifications or distortion detected in either breast.  # Patient underwent ultrasound-guided core biopsy of the right axillary  mass. 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  Ki-67 80-90%, Flow cytometry has insufficient cells for analysis.    02/17/2020 Bone Marrow Biopsy   Bone marrow biopsy is negative   02/18/2020 Echocardiogram   Echocardiogram showed normal LVEF   03/02/2020 Imaging   PET scan showed right axillary pathological adenopathy, largest lymph node 3.5 cm with maximum SUV 42.8.  An adjacent smaller peripheral right axillary lymph node measuring 1.3 cm with SUV of 23.3. Anterior mediastinal density favors benign thymic tissue maximum SUV 3.0 which is due [place] 4, far below the level of last has lymphadenopathy.   03/03/2020 - 05/30/2020 Chemotherapy   03/03/2020 - 05/30/2020 4 cycles of BV AVD Q21 days.   Patient does not desire to preserve fertility.  She has IUD for contraceptive measures.   05/20/2020 Imaging   PET showed complete response   06/02/2020 - 06/25/2020 Chemotherapy   2 cycles of BV AVD Q21 days   09/01/2020 Imaging   PET scan  1. No signs to suggest residual or recurrent FDG avid tumor.2. Resolution of previously noted generalized marrow hypermetabolism.3. FDG avid brown fat noted within the neck and chest compatible with benign physiologic activity.   09/13/2020 Procedure   Her Mediport was removed by Dr. Lucky Cowboy.   03/01/2021 Imaging   Surveillance PET showed No evidence of active lymphoma.   10/13/2021 Imaging   Surveillance PET showed  1. No evidence of lymphoma recurrence. No lymphadenopathy. Normal spleen and bone marrow.2. Particular attention directed to the RIGHT axilla.3. Benign cyst of the LEFT ovary   03/03/2022 Mammogram   Patient palpated left breast mass with rapid growth.   Bilateral diagnostic mammogram/ left axilla  showed 1. Suspicious 2.4 cm mass along the 11 o'clock axis of the left breast. Recommend ultrasound-guided biopsy. 2. No suspicious left axillary lymphadenopathy. 3. No mammographic evidence of malignancy on the right.    03/06/2022 Relapse/Recurrence   Left breast mass biopsy showed ALK positive anaplastic large cell lymphoma. Ki67 100% Flowcytometry is overall non contributory.    03/15/2022 Imaging   PET restaging showed  1. New hypermetabolic round mass in the medial LEFT breast consistent with lymphoma recurrence. 2. New lesion in the mid body of the pancreas consistent with lymphoma recurrence/metastasis. 3. Very small hypermetabolic RIGHT supraclavicular node is concerning for lymphoma. 4. Two pulmonary nodules in the RIGHT lung are new from prior. Indeterminate nodules.    03/21/2022 Cancer Staging   Staging form: Hodgkin and Non-Hodgkin Lymphoma, AJCC 8th Edition - Clinical stage from 03/21/2022: Stage IV - Signed by Earlie Server, MD on 04/08/2022 Histopathologic type: Anaplastic large cell lymphoma, T cell and Null cell type Stage prefix: Recurrence   03/28/2022 Echocardiogram   2D Echo LVEF 60-65%   03/28/2022 Bone Marrow Biopsy   Bone marrow biopsy showed  Aspirate : no immunophenotypic evidence of a lymphoproliferative disorder (no monoclonal B cells or immunophenotypically abnormal T cells detected).  Core biopsy showed Hypocellular bone marrow (30%) with otherwise orderly trilineage hematopoiesis. -No morphologic or immunohistochemical evidence of the patient's known  recurrent anaplastic large cell lymphoma.  04/04/2022 -  Chemotherapy   Patient is on Treatment Plan : NON-HODGKINS LYMPHOMA ICE q21d   Intrathecal with MTX 87m, Ara C 454mand hydrocortisone 100 mg    04/04/2022 Procedure   Lumbar puncture   CSF flowcytometry  Insufficient B and T cells detected to adequately evaluate for clonality or aberrancy in a low cellularity specimen,      Patient presents  post  chemotherapy evaluation.  [facility]. S/p blood patch by IR due to persistent headache and neck pain. She feels he symptom slightly improved, but  persists.  Today she feels much better.  Continues on oxycodone PRN. She tried a few doses of Fioricet.  Manageable nausea and vomiting.   Left breast mass has decreased in size, open wound is almost healed.    Review of Systems  Constitutional:  Positive for appetite change. Negative for chills, fatigue, fever and unexpected weight change.  HENT:   Negative for hearing loss and voice change.   Eyes:  Negative for eye problems.  Respiratory:  Negative for chest tightness, cough and shortness of breath.   Cardiovascular:  Negative for chest pain.  Gastrointestinal:  Positive for nausea and vomiting. Negative for abdominal distention, abdominal pain and blood in stool.  Endocrine: Negative for hot flashes.  Genitourinary:  Negative for difficulty urinating and frequency.   Musculoskeletal:  Negative for arthralgias.       Neck pain  Skin:  Negative for itching and rash.  Neurological:  Positive for headaches. Negative for extremity weakness.  Hematological:  Negative for adenopathy.  Psychiatric/Behavioral:  Negative for confusion.     MEDICAL HISTORY:  Past Medical History:  Diagnosis Date   Anxiety    Depression    Medical history non-contributory     SURGICAL HISTORY: Past Surgical History:  Procedure Laterality Date   BREAST BIOPSY Left 03/06/2022   [place] BREAST BX W LOC DEV 1ST LESION IMG BX SPEC [place]IDE 03/06/2022 [facility] MAMMOGRAPHY   DILATION AND CURETTAGE OF UTERUS     DILATION AND EVACUATION N/A 02/17/2017   Procedure: DILATATION AND EVACUATION;  Surgeon: OzJanyth PupaDO;  [facility];  Service: Gynecology;  Laterality: N/A;   IR FL GUIDED LOC OF NEEDLE/CATH TIP FOR SPINAL INJECTION LT  04/04/2022   IR FLUORO GUIDED NEEDLE PLC ASPIRATION/INJECTION LOC  04/07/2022   PORTA CATH INSERTION N/A 02/23/2020   Procedure: PORTA CATH INSERTION;  Surgeon: DeAlgernon HuxleyMD;  [facility];  Service: Cardiovascular;  Laterality: N/A;   PORTA CATH REMOVAL  N/A 09/13/2020   Procedure: PORTA CATH REMOVAL;  Surgeon: DeAlgernon HuxleyMD;  [facility];  Service: Cardiovascular;  Laterality: N/A;    SOCIAL HISTORY: Social History   Socioeconomic History   Marital status: Married    Spouse name: Cody   Number of children: 1   Years of education: Not on file   Highest education level: Not on file  Occupational History   Occupation: social media- marketing   Tobacco Use   Smoking status: Never   Smokeless tobacco: Never  Vaping Use   Vaping Use: Never used  Substance and Sexual Activity   Alcohol use: Yes    Comment: socially   Drug use: No   Sexual activity: Yes    Birth control/protection: None  Other Topics Concern   Not on file  Social History Narrative   Not on file  Social Determinants of Health   Financial Resource Strain: Low Risk  (01/16/2019)   Overall Financial Resource Strain (CARDIA)    Difficulty of Paying Living Expenses: Not hard at all  Food Insecurity: No Food Insecurity (01/16/2019)   Hunger Vital Sign    Worried About Running Out of Food in the Last Year: Never true    Ran Out of Food in the Last Year: Never true  Transportation Needs: No Transportation Needs (01/16/2019)   PRAPARE - Hydrologist (Medical): No    Lack of Transportation (Non-Medical): No  Physical Activity: Unknown (01/19/2019)   Exercise Vital Sign    Days of Exercise per Week: Patient refused    Minutes of Exercise per Session: Patient refused  Stress: No Stress Concern Present (01/19/2019)   [place]    Feeling of Stress : Only a little  Social Connections: Unknown (01/19/2019)   Social Connection and Isolation Panel [NHANES]    Frequency of Communication with Friends and Family: Patient refused    Frequency of Social Gatherings with Friends and Family: Patient refused    Attends Religious Services: Patient refused    Active  Member of Clubs or Organizations: Patient refused    Attends Archivist Meetings: Patient refused    Marital Status: Patient refused  Intimate Partner Violence: Not At Risk (01/19/2019)   Humiliation, Afraid, Rape, and Kick questionnaire    Fear of Current or Ex-Partner: No    Emotionally Abused: No    Physically Abused: No    Sexually Abused: No    FAMILY HISTORY: Family History  Problem Relation Age of Onset   Asthma Maternal Aunt    Hypertension Maternal Grandmother    Ovarian cancer Paternal Grandmother        dx 76s-80s    ALLERGIES:  has No Known Allergies.  MEDICATIONS:  Current Outpatient Medications  Medication Sig Dispense Refill   acetaminophen (TYLENOL) 500 MG tablet Take 500 mg by mouth every 6 (six) hours as needed.     allopurinol (ZYLOPRIM) 300 MG tablet Take 1 tablet (300 mg total) by mouth daily. 30 tablet 2   butalbital-acetaminophen-caffeine (FIORICET) 50-325-40 MG tablet Take 1 tablet by mouth every 6 (six) hours as needed for headache. 20 tablet 0   chlorhexidine (PERIDEX) 0.12 % solution Use as directed 5 mLs in the mouth or throat 2 (two) times daily. 473 mL 0   dexamethasone (DECADRON) 4 MG tablet Take 2 tablets (8 mg total) by mouth daily. Start the day after chemotherapy for 2 days. Take with food. 30 tablet 1   docusate sodium (COLACE) 100 MG capsule Take 1 capsule (100 mg total) by mouth 2 (two) times daily. 10 capsule 0   LORazepam (ATIVAN) 0.5 MG tablet Take 1 tablet (0.5 mg total) by mouth every 6 (six) hours as needed for anxiety. 60 tablet 0   naloxone (NARCAN) nasal spray 4 mg/0.1 mL SPRAY 1 SPRAY INTO ONE NOSTRIL AS DIRECTED FOR OPIOID OVERDOSE (TURN PERSON ON SIDE AFTER DOSE. IF NO RESPONSE IN 2-3 MINUTES OR PERSON RESPONDS BUT RELAPSES, REPEAT USING A NEW SPRAY DEVICE AND SPRAY INTO THE OTHER NOSTRIL. CALL 911 AFTER USE.) * EMERGENCY USE ONLY * 1 each 0   oxyCODONE (OXY IR/ROXICODONE) 5 MG immediate release tablet Take 1-2 tablets (5-10  mg total) by mouth every 6 (six) hours as needed for severe pain. 90 tablet 0   prochlorperazine (COMPAZINE) 10 MG tablet  Take 1 tablet (10 mg total) by mouth every 6 (six) hours as needed for nausea or vomiting. 30 tablet 1   escitalopram (LEXAPRO) 10 MG tablet Take 10 mg by mouth daily. (Patient not taking: Reported on 03/28/2022)     escitalopram (LEXAPRO) 5 MG tablet Take 5 mg by mouth daily. (Patient not taking: Reported on 03/21/2022)     Levonorgestrel (SKYLA) 13.5 MG IUD  (Patient not taking: Reported on 03/28/2022)     loperamide (IMODIUM) 2 MG capsule Take 1 capsule (2 mg total) by mouth See admin instructions. Initial: 4 mg, followed by 2 mg every 2 to 4 hours or after each loose stool; Max 16 mg /24 hours (Patient not taking: Reported on 09/13/2020) 60 capsule 1   ondansetron (ZOFRAN) 8 MG tablet Take 1 tablet (8 mg total) by mouth every 8 (eight) hours as needed for nausea or vomiting. Start 2 days after chemotherapy. (Patient not taking: Reported on 03/31/2022) 30 tablet 1   promethazine (PHENERGAN) 25 MG tablet Take 1 tablet (25 mg total) by mouth every 6 (six) hours as needed for nausea or vomiting. (Patient not taking: Reported on 09/03/2020) 120 tablet 2   venlafaxine (EFFEXOR) 37.5 MG tablet TAKE 1 TABLET BY MOUTH EVERY DAY (Patient not taking: Reported on 04/12/2022) 90 tablet 1   No current facility-administered medications for this visit.     PHYSICAL EXAMINATION: ECOG PERFORMANCE STATUS: 1 - Symptomatic but completely ambulatory Vitals:   04/12/22 0841  BP: 125/85  Pulse: 82  Temp: 98.4 F (36.9 C)  SpO2: 100%   Filed Weights   04/12/22 0841  Weight: 153 lb 11.2 oz (69.7 kg)     Physical Exam Constitutional:      General: She is not in acute distress. HENT:     Head: Normocephalic and atraumatic.  Eyes:     General: No scleral icterus. Cardiovascular:     Rate and Rhythm: Normal rate and regular rhythm.  Pulmonary:     Effort: Pulmonary effort is normal. No  respiratory distress.     Breath sounds: No wheezing.  Abdominal:     General: Bowel sounds are normal. There is no distension.     Palpations: Abdomen is soft.  Musculoskeletal:        General: No deformity. Normal range of motion.     Cervical back: Normal range of motion and neck supple.  Skin:    General: Skin is warm and dry.     Findings: Lesion present.  Neurological:     Mental Status: She is alert and oriented to person, place, and time. Mental status is at baseline.     Cranial Nerves: No cranial nerve deficit.     Coordination: Coordination normal.  Psychiatric:        Mood and Affect: Mood normal.   04/04/22   LABORATORY DATA:  I have reviewed the data as listed     Latest Ref Rng & Units 04/12/2022    8:30 AM 04/07/2022    8:30 AM 04/06/2022    8:40 AM  CBC  WBC 4.0 - 10.5 K/uL 2.5  9.3  10.1   Hemoglobin 12.0 - 15.0 g/dL 12.2  11.7  12.0   Hematocrit 36.0 - 46.0 % 37.1  35.8  35.6   Platelets 150 - 400 K/uL 164  291  306       Latest Ref Rng & Units 04/12/2022    8:30 AM 04/04/2022    1:13 PM 03/28/2022  8:05 AM  CMP  Glucose 70 - 99 mg/dL 108  136  105   BUN 6 - 20 mg/dL _0 Creatinine 0.44 - 1.00 mg/dL 0.65  0.75  0.66   Sodium 135 - 145 mmol/L 136  136  137   Potassium 3.5 - 5.1 mmol/L 3.9  3.8  4.5   Chloride 98 - 111 mmol/L 104  105  105   CO2 22 - 32 mmol/L _1 Calcium 8.9 - 10.3 mg/dL 9.1  9.1  9.1   Total Protein 6.5 - 8.1 g/dL 7.8  7.8  7.5   Total Bilirubin 0.3 - 1.2 mg/dL 0.7  0.6  0.3   Alkaline Phos 38 - 126 U/L 57  64  60   AST 15 - 41 U/L 32  14  15   ALT 0 - 44 U/L 82  11  9     Iron/TIBC/Ferritin/ %Sat No results found for: "IRON", "TIBC", "FERRITIN", "IRONPCTSAT"    RADIOGRAPHIC STUDIES: I have personally reviewed the radiological images as listed and agreed with the findings in the report. IR Fluoro Guide Ndl Plmt / BX  Result Date: 04/10/2022 CLINICAL DATA:  Persistent severe headache after lumbar  puncture FLUOROSCOPY: Radiation Exposure Index (as provided by the fluoroscopic device): 0.6 minutes (59 mGy) PROCEDURE: Epidural blood patch using fluoroscopic guidance The procedure, risks, benefits, and alternatives were explained to the patient. Questions regarding the procedure were encouraged and answered. The patient understands and consents to the procedure. LUMBAR EPIDURAL INJECTION: An interlaminar approach was performed on left at L3-L4. This corresponded to the same level of access based on comparison imaging from lumbar puncture dated 04/04/2022. The overlying skin was cleansed and anesthetized. A 20 gauge epidural needle was advanced using loss-of-resistance technique. DIAGNOSTIC EPIDURAL INJECTION: Injection of Isovue-M 200 shows a good epidural pattern with spread above and below the level of needle placement, primarily on the midline. No vascular opacification is seen. THERAPEUTIC EPIDURAL INJECTION: Approximately 15 mL of freshly drawn autologous blood was then administered. The procedure was well-tolerated, and the patient was discharged thirty minutes following the injection in good condition. COMPLICATIONS: None immediate IMPRESSION: Technically successful epidural blood patch using fluoroscopic guidance. Electronically Signed   By: Albin Felling M.D.   On: 04/10/2022 12:13   IR FL GUIDED LOC OF NEEDL/CATH TIP FOR SPINAL INJECT LT  Result Date: 04/04/2022 CLINICAL DATA:  Provided history: Anaplastic ALK-positive large-cell lymphoma of lymph node of axilla. Request for lumbar puncture and intrathecal administration of methotrexate, cytarabine and hydrocortisone. EXAM: LUMBAR PUNCTURE UNDER FLUOROSCOPY PROCEDURE: Prior to the procedure, Rushie Nyhan, NP obtained informed consent from the patient. This process included a discussion of procedural risks. An appropriate skin entry site was determined under fluoroscopy and marked. A time-out was performed. The operated donned sterile gloves  and a mask. The skin entry site was prepped with Betadine, draped in the usual sterile fashion and infiltrated locally with 1% lidocaine. Subsequently under intermittent fluoroscopic guidance, a 20-gauge spinal needle was advanced into the thecal sac at the L4-L5 level. There was spontaneous return of clear CSF. 6 mL of CSF were collected for laboratory studies. Subsequently, a chemotherapy solution was injected into the thecal sac (12 mg methotrexate, 100 mg Solu-Cortef, 40 mg cytarabine). The inner stylet was then replaced within the needle and the needle was removed in its entirety. The patient tolerated the procedure well, and no immediate post-procedure complication was apparent. The  examination was performed by Rushie Nyhan, NP, and was supervised and interpreted by Dr. Kellie Simmering. FLUOROSCOPY: Fluoroscopy time: 1 minute, 18 seconds (17.5 mGy). IMPRESSION: Technically successful fluoroscopically-guided L4-L5 lumbar puncture. 6 mL of CSF collected for laboratory studies. Intrathecal injection of chemotherapy and steroid. No immediate post-procedure complication. Electronically Signed   By: Kellie Simmering D.O.   On: 04/04/2022 11:23   ECHOCARDIOGRAM COMPLETE  Result Date: 03/31/2022    ECHOCARDIOGRAM REPORT   Patient Name:   Angel Tate Date of Exam: 03/31/2022 Medical Rec #:  [id number]          Height:       62.0 in Accession #:    [id number]         Weight:       162.0 lb Date of Birth:  Jan 14, 1991          BSA:          1.748 m Patient Age:    31 years           BP:           121/71 mmHg Patient Gender: F                  HR:           89 bpm. Exam Location:  [facility] Procedure: 2D Echo, Color Doppler, Cardiac Doppler and Strain Analysis Indications:     Z09 Encounter for monitoring cardiotoxic drug therapy  History:         Patient has prior history of Echocardiogram examinations, most                  recent 02/18/2020. Anaplastic ALK-positive large cell lymphoma                  of lymph node of  axilla.  Sonographer:     Charmayne Sheer Referring Phys:  [id number]   Diagnosing Phys: Ida Rogue MD  Sonographer Comments: Tumor on left breast. Global longitudinal strain was attempted. IMPRESSIONS  1. Left ventricular ejection fraction, by estimation, is 60 to 65%. The left ventricle has normal function. The left ventricle has no regional wall motion abnormalities. Left ventricular diastolic parameters were normal. The average left ventricular global longitudinal strain is -18.9 %.  2. Right ventricular systolic function is normal. The right ventricular size is normal.  3. The mitral valve is normal in structure. Mild mitral valve regurgitation. No evidence of mitral stenosis.  4. The aortic valve was not well visualized. Aortic valve regurgitation is not visualized. No aortic stenosis is present.  5. The inferior vena cava is normal in size with greater than 50% respiratory variability, suggesting right atrial pressure of 3 mmHg. FINDINGS  Left Ventricle: Left ventricular ejection fraction, by estimation, is 60 to 65%. The left ventricle has normal function. The left ventricle has no regional wall motion abnormalities. The average left ventricular global longitudinal strain is -18.9 %. The left ventricular internal cavity size was normal in size. There is no left ventricular hypertrophy. Left ventricular diastolic parameters were normal. Right Ventricle: The right ventricular size is normal. No increase in right ventricular wall thickness. Right ventricular systolic function is normal. Left Atrium: Left atrial size was normal in size. Right Atrium: Right atrial size was normal in size. Pericardium: There is no evidence of pericardial effusion. Mitral Valve: The mitral valve is normal in structure. Mild mitral valve regurgitation. No evidence of mitral valve stenosis. Tricuspid Valve: The tricuspid valve is normal in  structure. Tricuspid valve regurgitation is not demonstrated. No evidence of tricuspid  stenosis. Aortic Valve: The aortic valve was not well visualized. Aortic valve regurgitation is not visualized. No aortic stenosis is present. Aortic valve mean gradient measures 4.0 mmHg. Aortic valve peak gradient measures 7.2 mmHg. Aortic valve area, by VTI measures 2.54 cm. Pulmonic Valve: The pulmonic valve was normal in structure. Pulmonic valve regurgitation is not visualized. No evidence of pulmonic stenosis. Aorta: The aortic root is normal in size and structure. Venous: The inferior vena cava is normal in size with greater than 50% respiratory variability, suggesting right atrial pressure of 3 mmHg. IAS/Shunts: No atrial level shunt detected by color flow Doppler.  LEFT VENTRICLE PLAX 2D LVIDd:         4.70 cm   Diastology LVIDs:         3.10 cm   LV e' medial:    11.60 cm/s LV PW:         1.20 cm   LV E/e' medial:  7.1 LV IVS:        0.80 cm   LV e' lateral:   19.70 cm/s LVOT diam:     2.20 cm   LV E/e' lateral: 4.2 LV SV:         67 LV SV Index:   38        2D Longitudinal Strain LVOT Area:     3.80 cm  2D Strain GLS Avg:     -18.9 %  RIGHT VENTRICLE RV Basal diam:  3.80 cm RV S prime:     14.10 cm/s LEFT ATRIUM             Index        RIGHT ATRIUM           Index LA diam:        3.20 cm 1.83 cm/m   RA Area:     19.50 cm LA Vol (A2C):   29.2 ml 16.71 ml/m  RA Volume:   58.90 ml  33.70 ml/m LA Vol (A4C):   35.7 ml 20.42 ml/m LA Biplane Vol: 34.3 ml 19.62 ml/m  AORTIC VALVE                    PULMONIC VALVE AV Area (Vmax):    2.79 cm     PV Vmax:       1.21 m/s AV Area (Vmean):   2.54 cm     PV Vmean:      78.400 cm/s AV Area (VTI):     2.54 cm     PV VTI:        0.227 m AV Vmax:           134.00 cm/s  PV Peak grad:  5.9 mmHg AV Vmean:          90.400 cm/s  PV Mean grad:  3.0 mmHg AV VTI:            0.263 m AV Peak Grad:      7.2 mmHg AV Mean Grad:      4.0 mmHg LVOT Vmax:         98.20 cm/s LVOT Vmean:        60.300 cm/s LVOT VTI:          0.176 m LVOT/AV VTI ratio: 0.67  AORTA Ao Root diam:  2.70 cm MITRAL VALVE MV Area (PHT): 4.77 cm    SHUNTS MV Decel Time: 159 msec  Systemic VTI:  0.18 m MV E velocity: 82.30 cm/s  Systemic Diam: 2.20 cm MV A velocity: 57.60 cm/s MV E/A ratio:  1.43 Ida Rogue MD Electronically signed by Ida Rogue MD Signature Date/Time: 03/31/2022/12:46:09 PM    Final    CT BONE MARROW BIOPSY & ASPIRATION  Result Date: 03/28/2022 CLINICAL DATA:  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 EXAM: CT GUIDED BONE MARROW ASPIRATION AND BIOPSY ANESTHESIA/SEDATION: Moderate (conscious) sedation was employed during this procedure. A total of Versed 2.0 mg and Fentanyl 100 mcg was administered intravenously by radiology nursing. Moderate Sedation Time: 27 minutes. The patient's level of consciousness and vital signs were monitored continuously by radiology nursing throughout the procedure under my direct supervision. PROCEDURE: The procedure risks, benefits, and alternatives were explained to the patient. Questions regarding the procedure were encouraged and answered. The patient understands and consents to the procedure. A time out was performed prior to initiating the procedure. The right gluteal region was prepped with chlorhexidine. Sterile gown and sterile gloves were used for the procedure. Local anesthesia was provided with 1% Lidocaine. Under CT guidance, an 11 gauge On Control bone cutting needle was advanced from a posterior approach into the right iliac bone. Needle positioning was confirmed with CT. Initial non heparinized and heparinized aspirate samples were obtained of bone marrow. Core biopsy was performed via the On Control drill needle. Two separate core biopsy samples were obtained. COMPLICATIONS: None FINDINGS: Inspection of initial non heparinized aspirate for particles was hampered by clotting. For this reason, 2 separate core biopsy samples were obtained. IMPRESSION: CT guided bone marrow biopsy of right  posterior iliac bone with both aspirate and core samples obtained. Electronically Signed   By: Aletta Edouard M.D.   On: 03/28/2022 11:07   MR Brain W Wo Contrast  Result Date: 03/26/2022 CLINICAL DATA:  ALK positive large-cell lymphoma EXAM: MRI HEAD WITHOUT AND WITH CONTRAST TECHNIQUE: Multiplanar, multiecho pulse sequences of the brain and surrounding structures were obtained without and with intravenous contrast. CONTRAST:  7.45m GADAVIST GADOBUTROL 1 MMOL/ML IV SOLN COMPARISON:  None Available. FINDINGS: Brain: No abnormal parenchymal or meningeal enhancement. No restricted diffusion to suggest acute or subacute infarct. No acute hemorrhage, mass, mass effect, or midline shift. No hemosiderin deposition to suggest remote hemorrhage. No hydrocephalus or extra-axial collection. No abnormal T2 signal to suggest demyelinating disease. Vascular: Normal arterial flow voids. Normal arterial and venous enhancement. Skull and upper cervical spine: Normal marrow signal. Sinuses/Orbits: Mucous retention cyst in the left maxillary sinus. The orbits are unremarkable. Other: The mastoids are well aerated. IMPRESSION: No acute intracranial process. No evidence of intracranial metastatic disease. Electronically Signed   By: AMerilyn BabaM.D.   On: 03/26/2022 00:59   NM PET Image Restage (PS) Skull Base to Thigh (F-18 FDG)  Result Date: 03/15/2022 CLINICAL DATA:  Subsequent treatment strategy for anaplastic alk positive large-cell lymphoma. EXAM: NUCLEAR MEDICINE PET SKULL BASE TO THIGH TECHNIQUE: 9.2 mCi F-18 FDG was injected intravenously. Full-ring PET imaging was performed from the skull base to thigh after the radiotracer. CT data was obtained and used for attenuation correction and anatomic localization. Fasting blood glucose: 85 mg/dl COMPARISON:  PET-CT 10/12/2021 FINDINGS: Mediastinal blood pool activity: SUV max 1.6 Liver activity: SUV max 2.0 NECK: Hypermetabolic focus in the RIGHT supraclavicular neck  appears to localize to a small node measuring 5 mm image 52/2. Activity is intense for size with SUV max equal 5.7 (image 53 )PET data set Incidental  CT findings: None. CHEST: Within the medial LEFT breast there is a new round mass measuring 2.9 cm in diameter and with intense metabolic activity (SUV max equal 34.3). Two small RIGHT lung nodules are new from prior. 8 mm nodule on image 91/2 and 5 mm nodule on image 87/2. These nodules appear associated with the horizontal fissure. No faint radiotracer activity associated with the larger nodule Incidental CT findings: None. ABDOMEN/PELVIS: New intense focal activity localizes to the body of the pancreas with SUV max equal 12.2 (image 129. Lesion in the subtly evident on the noncontrast CT measuring approximately 18 mm. Incidental CT findings: None. SKELETON: No focal hypermetabolic activity to suggest skeletal metastasis. Incidental CT findings: None. IMPRESSION: 1. New hypermetabolic round mass in the medial LEFT breast consistent with lymphoma recurrence. 2. New lesion in the mid body of the pancreas consistent with lymphoma recurrence/metastasis. 3. Very small hypermetabolic RIGHT supraclavicular node is concerning for lymphoma. 4. Two pulmonary nodules in the RIGHT lung are new from prior. Indeterminate nodules. Recommend close attention on follow-up. These results will be called to the ordering clinician or representative by the Radiologist Assistant, and communication documented in the PACS or [employer]. Electronically Signed   By: Suzy Bouchard M.D.   On: 03/15/2022 12:33

## 2022-04-12 NOTE — Assessment & Plan Note (Addendum)
S/p blood patch Obtain CT head wo for evaluation.  Continue supportive care

## 2022-04-13 ENCOUNTER — Ambulatory Visit
Admission: RE | Admit: 2022-04-13 | Discharge: 2022-04-13 | Disposition: A | Discharge status: Home / Self Care | Payer: BC Managed Care – PPO | Source: Ambulatory Visit | Attending: Oncology | Admitting: Oncology

## 2022-04-13 ENCOUNTER — Telehealth: Payer: Self-pay

## 2022-04-13 ENCOUNTER — Encounter: Payer: Self-pay | Admitting: Oncology

## 2022-04-13 DIAGNOSIS — C8464 Anaplastic large cell lymphoma, ALK-positive, lymph nodes of axilla and upper limb: Secondary | ICD-10-CM | POA: Diagnosis present

## 2022-04-13 DIAGNOSIS — G971 Other reaction to spinal and lumbar puncture: Secondary | ICD-10-CM | POA: Diagnosis present

## 2022-04-13 NOTE — Addendum Note (Signed)
Addended by: Earlie Server on: 04/13/2022 01:19 PM   Modules accepted: Orders

## 2022-04-13 NOTE — Telephone Encounter (Signed)
Pt states that she would prefer to have Intratecal MTX on a day where she can go home after procedure, so date has been changed to 1/3. Pt informed and verbalized understanding.

## 2022-04-14 ENCOUNTER — Other Ambulatory Visit: Payer: Self-pay | Admitting: Oncology

## 2022-04-14 DIAGNOSIS — C8464 Anaplastic large cell lymphoma, ALK-positive, lymph nodes of axilla and upper limb: Secondary | ICD-10-CM

## 2022-04-18 ENCOUNTER — Other Ambulatory Visit: Payer: BC Managed Care – PPO

## 2022-04-18 ENCOUNTER — Ambulatory Visit: Payer: BC Managed Care – PPO | Admitting: Oncology

## 2022-04-18 ENCOUNTER — Encounter: Payer: Self-pay | Admitting: Oncology

## 2022-04-19 ENCOUNTER — Telehealth (INDEPENDENT_AMBULATORY_CARE_PROVIDER_SITE_OTHER): Payer: Self-pay

## 2022-04-19 NOTE — Telephone Encounter (Signed)
Spoke with the patient and she is scheduled with Dr. Lucky Cowboy on 04/20/22 for [facility] with a 11:00 am arrival time. Pre-procedure instructions were discussed and patient stated she understood.

## 2022-04-20 ENCOUNTER — Ambulatory Visit
Admission: RE | Admit: 2022-04-20 | Discharge: 2022-04-20 | Disposition: A | Discharge status: Home / Self Care | Payer: BC Managed Care – PPO | Attending: Vascular Surgery | Admitting: Vascular Surgery

## 2022-04-20 ENCOUNTER — Encounter
Admission: RE | Disposition: A | Discharge status: Home / Self Care | Payer: Self-pay | Source: Home / Self Care | Attending: Vascular Surgery

## 2022-04-20 ENCOUNTER — Encounter: Payer: Self-pay | Admitting: Vascular Surgery

## 2022-04-20 DIAGNOSIS — C8581 Other specified types of non-Hodgkin lymphoma, lymph nodes of head, face, and neck: Secondary | ICD-10-CM | POA: Diagnosis not present

## 2022-04-20 DIAGNOSIS — C8464 Anaplastic large cell lymphoma, ALK-positive, lymph nodes of axilla and upper limb: Secondary | ICD-10-CM | POA: Diagnosis not present

## 2022-04-20 HISTORY — PX: PORTA CATH INSERTION: CATH118285

## 2022-04-20 SURGERY — PORTA CATH INSERTION
Anesthesia: Moderate Sedation

## 2022-04-20 MED ORDER — FENTANYL CITRATE (PF) 100 MCG/2ML IJ SOLN
INTRAMUSCULAR | Status: DC | PRN
  Administered 2022-04-20 (×2): 50 ug via INTRAVENOUS

## 2022-04-20 MED ORDER — FENTANYL CITRATE (PF) 100 MCG/2ML IJ SOLN
INTRAMUSCULAR | Status: AC
  Filled 2022-04-20: qty 2

## 2022-04-20 MED ORDER — FAMOTIDINE 20 MG PO TABS: 40.0000 mg | ORAL_TABLET | Freq: Once | ORAL | Status: DC | PRN

## 2022-04-20 MED ORDER — HYDROMORPHONE HCL 1 MG/ML IJ SOLN: 1.0000 mg | Freq: Once | INTRAMUSCULAR | Status: DC | PRN

## 2022-04-20 MED ORDER — DIPHENHYDRAMINE HCL 50 MG/ML IJ SOLN: 50.0000 mg | Freq: Once | INTRAMUSCULAR | Status: DC | PRN

## 2022-04-20 MED ORDER — METHYLPREDNISOLONE SODIUM SUCC 125 MG IJ SOLR: 125.0000 mg | Freq: Once | INTRAMUSCULAR | Status: DC | PRN

## 2022-04-20 MED ORDER — SODIUM CHLORIDE 0.9 % IV SOLN
INTRAVENOUS | Status: DC
  Administered 2022-04-20: 1000 mL via INTRAVENOUS

## 2022-04-20 MED ORDER — CEFAZOLIN SODIUM-DEXTROSE 2-4 GM/100ML-% IV SOLN
INTRAVENOUS | Status: AC
  Administered 2022-04-20: 2 g via INTRAVENOUS
  Filled 2022-04-20: qty 100

## 2022-04-20 MED ORDER — CEFAZOLIN SODIUM-DEXTROSE 2-4 GM/100ML-% IV SOLN: 2.0000 g | INTRAVENOUS | Status: AC

## 2022-04-20 MED ORDER — MIDAZOLAM HCL 2 MG/2ML IJ SOLN
INTRAMUSCULAR | Status: DC | PRN
  Administered 2022-04-20: 2 mg via INTRAVENOUS

## 2022-04-20 MED ORDER — SODIUM CHLORIDE 0.9 % IV SOLN
80.0000 mg | Freq: Once | INTRAVENOUS | Status: DC
  Filled 2022-04-20: qty 2

## 2022-04-20 MED ORDER — MIDAZOLAM HCL 2 MG/ML PO SYRP: 8.0000 mg | ORAL_SOLUTION | Freq: Once | ORAL | Status: DC | PRN

## 2022-04-20 MED ORDER — MIDAZOLAM HCL 2 MG/2ML IJ SOLN
INTRAMUSCULAR | Status: AC
  Filled 2022-04-20: qty 2

## 2022-04-20 MED ORDER — ONDANSETRON HCL 4 MG/2ML IJ SOLN: 4.0000 mg | Freq: Four times a day (QID) | INTRAMUSCULAR | Status: DC | PRN

## 2022-04-20 SURGICAL SUPPLY — 8 items
DERMABOND ADVANCED .7 DNX12 (GAUZE/BANDAGES/DRESSINGS) IMPLANT
KIT PORT POWER 8FR ISP CVUE (Port) IMPLANT
PACK ANGIOGRAPHY (CUSTOM PROCEDURE TRAY) ×1 IMPLANT
PENCIL ELECTRO HAND CTR (MISCELLANEOUS) IMPLANT
SPONGE XRAY 4X4 16PLY STRL (MISCELLANEOUS) IMPLANT
SUT MNCRL AB 4-0 PS2 18 (SUTURE) IMPLANT
SUT VIC AB 3-0 SH 27 (SUTURE) ×1
SUT VIC AB 3-0 SH 27X BRD (SUTURE) IMPLANT

## 2022-04-20 NOTE — Op Note (Signed)
      [facility]       Operative Note  Date: 04/20/2022  Preoperative diagnosis:  1. Lymphoma  Postoperative diagnosis:  Same as above  Procedures: #1. Ultrasound guidance for vascular access to the right internal jugular vein. #2. Fluoroscopic guidance for placement of catheter. #3. Placement of CT compatible Port-A-Cath, right internal jugular vein.  Surgeon: [name], MD.   Anesthesia: Local with moderate conscious sedation for approximately 17  minutes using 3 mg of Versed and 75 mcg of Fentanyl  Fluoroscopy time: less than 1 minute  Contrast used: 0  Estimated blood loss: 5 cc  Indication for the procedure:  The patient is a 31 y.o.female with recurrent lymphoma.  The patient needs a Port-A-Cath for durable venous access, chemotherapy, lab draws, and CT scans. We are asked to place this. Risks and benefits were discussed and informed consent was obtained.  Description of procedure: The patient was brought to the vascular and interventional radiology suite.  Moderate conscious sedation was administered throughout the procedure during a face to face encounter with the patient with my supervision of the RN administering medicines and monitoring the patient's vital signs, pulse oximetry, telemetry and mental status throughout from the start of the procedure until the patient was taken to the recovery room. The right neck chest and shoulder were sterilely prepped and draped, and a sterile surgical field was created. Ultrasound was used to help visualize a patent right internal jugular vein. This was then accessed under direct ultrasound guidance without difficulty with the Seldinger needle and a permanent image was recorded. A J-wire was placed. After skin nick and dilatation, the peel-away sheath was then placed over the wire. I then anesthetized an area under the clavicle approximately 1-2 fingerbreadths. A transverse incision was created and an inferior pocket  was created with electrocautery and blunt dissection. The port was then brought onto the field, placed into the pocket and secured to the chest wall with 2 Prolene sutures. The catheter was connected to the port and tunneled from the subclavicular incision to the access site. Fluoroscopic guidance was then used to cut the catheter to an appropriate length. The catheter was then placed through the peel-away sheath and the peel-away sheath was removed. The catheter tip was parked in excellent location under fluorocoscopic guidance in the cavoatrial junction. The pocket was then irrigated with antibiotic impregnated saline and the wound was closed with a running 3-0 Vicryl and a 4-0 Monocryl. The access incision was closed with a single 4-0 Monocryl. The Huber needle was used to withdraw blood and flush the port with heparinized saline. Dermabond was then placed as a dressing. The patient tolerated the procedure well and was taken to the recovery room in stable condition.   [name] 04/20/2022 12:40 PM   This note was created with Dragon Medical transcription system. Any errors in dictation are purely unintentional.

## 2022-04-20 NOTE — Discharge Instructions (Signed)
Implanted Port Home Guide  An implanted port is a type of central line that is placed under the skin. Central lines are used to provide IV access when treatment or nutrition needs to be given through a person's veins. Implanted ports are used for long-term IV access. An implanted port may be placed because: You need IV medicine that would be irritating to the small veins in your hands or arms. You need long-term IV medicines, such as antibiotics. You need IV nutrition for a long period. You need frequent blood draws for lab tests. You need dialysis.   Implanted ports are usually placed in the chest area, but they can also be placed in the upper arm, the abdomen, or the leg. An implanted port has two main parts: Reservoir. The reservoir is round and will appear as a small, raised area under your skin. The reservoir is the part where a needle is inserted to give medicines or draw blood. Catheter. The catheter is a thin, flexible tube that extends from the reservoir. The catheter is placed into a large vein. Medicine that is inserted into the reservoir goes into the catheter and then into the vein.   How will I care for my incision  You may shower tomorrow  How is my port accessed? Special steps must be taken to access the port: Before the port is accessed, a numbing cream can be placed on the skin. This helps numb the skin over the port site. Your health care provider uses a sterile technique to access the port. Your health care provider must put on a mask and sterile gloves. The skin over your port is cleaned carefully with an antiseptic and allowed to dry. The port is gently pinched between sterile gloves, and a needle is inserted into the port. Only "non-coring" port needles should be used to access the port. Once the port is accessed, a blood return should be checked. This helps ensure that the port is in the vein and is not clogged. If your port needs to remain accessed for a constant  infusion, a clear (transparent) bandage will be placed over the needle site. The bandage and needle will need to be changed every week, or as directed by your health care provider.   What is flushing? Flushing helps keep the port from getting clogged. Follow your health care provider's instructions on how and when to flush the port. Ports are usually flushed with saline solution or a medicine called heparin. The need for flushing will depend on how the port is used. If the port is used for intermittent medicines or blood draws, the port will need to be flushed: After medicines have been given. After blood has been drawn. As part of routine maintenance. If a constant infusion is running, the port may not need to be flushed.   How long will my port stay implanted? The port can stay in for as long as your health care provider thinks it is needed. When it is time for the port to come out, surgery will be done to remove it. The procedure is similar to the one performed when the port was put in. When should I seek immediate medical care? When you have an implanted port, you should seek immediate medical care if: You notice a bad smell coming from the incision site. You have swelling, redness, or drainage at the incision site. You have more swelling or pain at the port site or the surrounding area. You have a fever that   is not controlled with medicine.   This information is not intended to replace advice given to you by your health care provider. Make sure you discuss any questions you have with your health care provider. Document Released: 04/17/2005 Document Revised: 09/23/2015 Document Reviewed: 12/23/2012 Elsevier Interactive Patient Education  2017 [employer]    

## 2022-04-20 NOTE — Interval H&P Note (Signed)
History and Physical Interval Note:  04/20/2022 12:14 PM  Angel Tate  has presented today for surgery, with the diagnosis of Porta Cath Placement    Large cell lymphoma LN.  The various methods of treatment have been discussed with the patient and family. After consideration of risks, benefits and other options for treatment, the patient has consented to  Procedure(s): PORTA CATH INSERTION (N/A) as a surgical intervention.  The patient's history has been reviewed, patient examined, no change in status, stable for surgery.  I have reviewed the patient's chart and labs.  Questions were answered to the patient's satisfaction.     [name]

## 2022-04-21 ENCOUNTER — Other Ambulatory Visit: Payer: BC Managed Care – PPO

## 2022-04-21 ENCOUNTER — Ambulatory Visit: Payer: BC Managed Care – PPO | Admitting: Oncology

## 2022-04-25 ENCOUNTER — Inpatient Hospital Stay (HOSPITAL_BASED_OUTPATIENT_CLINIC_OR_DEPARTMENT_OTHER): Payer: BC Managed Care – PPO | Admitting: Oncology

## 2022-04-25 ENCOUNTER — Other Ambulatory Visit: Payer: Self-pay | Admitting: Oncology

## 2022-04-25 ENCOUNTER — Inpatient Hospital Stay: Payer: BC Managed Care – PPO

## 2022-04-25 ENCOUNTER — Encounter: Payer: Self-pay | Admitting: Oncology

## 2022-04-25 ENCOUNTER — Ambulatory Visit: Payer: BC Managed Care – PPO

## 2022-04-25 VITALS — BP 110/48 | HR 83 | Temp 98.2°F | Resp 18 | Wt 157.2 lb

## 2022-04-25 DIAGNOSIS — C8464 Anaplastic large cell lymphoma, ALK-positive, lymph nodes of axilla and upper limb: Secondary | ICD-10-CM | POA: Diagnosis not present

## 2022-04-25 DIAGNOSIS — Z5111 Encounter for antineoplastic chemotherapy: Secondary | ICD-10-CM

## 2022-04-25 DIAGNOSIS — G893 Neoplasm related pain (acute) (chronic): Secondary | ICD-10-CM | POA: Diagnosis not present

## 2022-04-25 DIAGNOSIS — C8468 Anaplastic large cell lymphoma, ALK-positive, lymph nodes of multiple sites: Secondary | ICD-10-CM | POA: Diagnosis not present

## 2022-04-25 DIAGNOSIS — R112 Nausea with vomiting, unspecified: Secondary | ICD-10-CM | POA: Diagnosis not present

## 2022-04-25 DIAGNOSIS — T451X5A Adverse effect of antineoplastic and immunosuppressive drugs, initial encounter: Secondary | ICD-10-CM

## 2022-04-25 LAB — URINALYSIS, COMPLETE (UACMP) WITH MICROSCOPIC
Bilirubin Urine: NEGATIVE
Glucose, UA: NEGATIVE mg/dL
Hgb urine dipstick: NEGATIVE
Ketones, ur: NEGATIVE mg/dL
Leukocytes,Ua: NEGATIVE
Nitrite: NEGATIVE
Protein, ur: NEGATIVE mg/dL
Specific Gravity, Urine: 1.017 (ref 1.005–1.030)
pH: 7 (ref 5.0–8.0)

## 2022-04-25 LAB — COMPREHENSIVE METABOLIC PANEL
ALT: 20 U/L (ref 0–44)
AST: 25 U/L (ref 15–41)
Albumin: 4 g/dL (ref 3.5–5.0)
Alkaline Phosphatase: 61 U/L (ref 38–126)
Anion gap: 5 (ref 5–15)
BUN: 11 mg/dL (ref 6–20)
CO2: 26 mmol/L (ref 22–32)
Calcium: 8.8 mg/dL — ABNORMAL LOW (ref 8.9–10.3)
Chloride: 106 mmol/L (ref 98–111)
Creatinine, Ser: 0.63 mg/dL (ref 0.44–1.00)
GFR, Estimated: >60 mL/min (ref >60)
Glucose, Bld: 80 mg/dL (ref 70–99)
Potassium: 4 mmol/L (ref 3.5–5.1)
Sodium: 137 mmol/L (ref 135–145)
Total Bilirubin: 0.5 mg/dL (ref 0.3–1.2)
Total Protein: 7.2 g/dL (ref 6.5–8.1)

## 2022-04-25 LAB — CBC WITH DIFFERENTIAL/PLATELET
Abs Immature Granulocytes: 0.05 10*3/uL (ref 0.00–0.07)
Basophils Absolute: 0 10*3/uL (ref 0.0–0.1)
Basophils Relative: 0 %
Eosinophils Absolute: 0 10*3/uL (ref 0.0–0.5)
Eosinophils Relative: 0 %
HCT: 33.7 % — ABNORMAL LOW (ref 36.0–46.0)
Hemoglobin: 11.2 g/dL — ABNORMAL LOW (ref 12.0–15.0)
Immature Granulocytes: 1 %
Lymphocytes Relative: 25 %
Lymphs Abs: 1.3 10*3/uL (ref 0.7–4.0)
MCH: 29.9 pg (ref 26.0–34.0)
MCHC: 33.2 g/dL (ref 30.0–36.0)
MCV: 90.1 fL (ref 80.0–100.0)
Monocytes Absolute: 0.6 10*3/uL (ref 0.1–1.0)
Monocytes Relative: 10 %
Neutro Abs: 3.4 10*3/uL (ref 1.7–7.7)
Neutrophils Relative %: 64 %
Platelets: 171 10*3/uL (ref 150–400)
RBC: 3.74 MIL/uL — ABNORMAL LOW (ref 3.87–5.11)
RDW: 13.3 % (ref 11.5–15.5)
WBC: 5.3 10*3/uL (ref 4.0–10.5)
nRBC: 0 % (ref 0.0–0.2)

## 2022-04-25 MED ORDER — LIDOCAINE-PRILOCAINE 2.5-2.5 % EX CREA: TOPICAL_CREAM | CUTANEOUS | 3 refills | Status: DC

## 2022-04-25 MED FILL — Dexamethasone Sodium Phosphate Inj 100 MG/10ML: INTRAMUSCULAR | Qty: 1 | Status: AC

## 2022-04-25 NOTE — Assessment & Plan Note (Signed)
Chemotherapy plan as listed above 

## 2022-04-25 NOTE — Assessment & Plan Note (Addendum)
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 CSF flowcytometry pending.  Recommend salvage therapy bridging to allogenic transplant Labs are reviewed and discussed with patient. S/p cycle 1 ICE with GCSF support. Proceed with Cycle 2  Day 1 Ifosfamide, Etoposide, [place] Day 2 Ifosfamide, Etoposide, Carboplatin, [place] Day 3 Ifosfamide, Etoposide, [place] Day 7 GCSF + IVF hydration.  Day 8 intrathecal triple therapy with MTX 59m, Ara C 478mand hydrocortisone 100 mg Encourage oral hydration.  Obtain PET after 2 cycles.

## 2022-04-25 NOTE — Assessment & Plan Note (Signed)
Recommend antimetics, Zofran, Compazine PRN, Ativan PRN as instructed

## 2022-04-25 NOTE — Assessment & Plan Note (Signed)
Continue Oxycodone '5mg'$  PRN as instructed.  Symptom has improved.

## 2022-04-25 NOTE — Progress Notes (Signed)
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 CSF flowcytometry pending.  Recommend salvage therapy bridging to allogenic transplant Labs are reviewed and discussed with patient. S/p cycle 1 ICE with GCSF support. Proceed with Cycle 2  Day 1 Ifosfamide, Etoposide, [place] Day 2 Ifosfamide, Etoposide, Carboplatin, [place] Day 3 Ifosfamide, Etoposide, [place] Day 7 GCSF + IVF hydration.  Day 8 intrathecal triple therapy with MTX 47m, Ara C 4101mand hydrocortisone 100 mg Encourage oral hydration.  Obtain PET after 2 cycles.     Chemotherapy induced nausea and vomiting Recommend antimetics, Zofran, Compazine PRN, Ativan PRN as instructed  Encounter for antineoplastic chemotherapy Chemotherapy plan as listed above.   Neoplasm related pain Continue Oxycodone 53m68mRN as instructed.  Symptom has improved.   Orders Placed This Encounter  Procedures   NM PET Image Restag (PS) Skull Base To Thigh    Standing Status:   Future    Standing Expiration Date:   04/25/2023    Order Specific Question:   If indicated for the ordered procedure, I authorize the administration of a radiopharmaceutical per Radiology protocol    Answer:   Yes    Order Specific  Question:   Is the patient pregnant?    Answer:   No    Order Specific Question:   Preferred imaging location?    Answer:   [facility]   Follow-up per LOS   All questions were answered. The patient knows to call the clinic with any problems, questions or concerns.  ZhoEarlie ServerD, PhD [facility] 04/25/2022     CHIEF COMPLAINTS/REASON FOR VISIT:  Follow up for anaplastic large cell lymphoma, ALK postive  HISTORY OF PRESENTING ILLNESS:   Angel Tate a  [age]o.  female presents for follow-up of ALK positive anaplastic large cell lymphoma Oncology history summary is listed below. Oncology History  Anaplastic ALK-positive large cell lymphoma [facility] of [facility]illa [facility] Staging   Staging form: Hodgkin and Non-Hodgkin Lymphoma, AJCC 8th Edition - Clinical stage from 02/04/2020: Stage I - Signed by Kelee Cunningham,Earlie ServerD on 10/16/2021 Stage prefix: Initial diagnosis   02/13/2020 Initial Diagnosis   Anaplastic ALK-positive large cell lymphoma of lymph node of right axilla   -Patient reports feeling right axillary mass in June 2021.  Initially the mass did not bother her.  Patient developed a rash in the right axillary/upper outer quadrant of the breast and had additional work-up.  Patient was treated with a 10-day course of doxycycline for possible infection.  Did not improve.  #02/04/2020, targeted ultrasound showed right axillary mass measuring 6.2 x 4 by 4 cm.  There are 2 adjacent smaller axillary mass measuring 2.0 x 1.2 x 1.4 cm and 0.8 x 0.7 x 0.9 cm.  Mammogram showed dense fibroglandular.  No suspicious mass  or malignant type microcalcifications or distortion detected in either breast.  # Patient underwent ultrasound-guided core biopsy of the right axillary mass. 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  Ki-67 80-90%, Flow cytometry has insufficient cells for analysis.    02/17/2020 Bone Marrow Biopsy    Bone marrow biopsy is negative   02/18/2020 Echocardiogram   Echocardiogram showed normal LVEF   03/02/2020 Imaging   PET scan showed right axillary pathological adenopathy, largest lymph node 3.5 cm with maximum SUV 42.8.  An adjacent smaller peripheral right axillary lymph node measuring 1.3 cm with SUV of 23.3. Anterior mediastinal density favors benign thymic tissue maximum SUV 3.0 which is due [place] 4, far below the level of last has lymphadenopathy.   03/03/2020 - 05/30/2020 Chemotherapy   03/03/2020 - 05/30/2020 4 cycles of BV AVD Q21 days.   Patient does not desire to preserve fertility.  She has IUD for contraceptive measures.   05/20/2020 Imaging   PET showed complete response   06/02/2020 - 06/25/2020 Chemotherapy   2 cycles of BV AVD Q21 days   09/01/2020 Imaging   PET scan  1. No signs to suggest residual or recurrent FDG avid tumor.2. Resolution of previously noted generalized marrow hypermetabolism.3. FDG avid brown fat noted within the neck and chest compatible with benign physiologic activity.   09/13/2020 Procedure   Her Mediport was removed by Dr. Lucky Cowboy.   03/01/2021 Imaging   Surveillance PET showed No evidence of active lymphoma.   10/13/2021 Imaging   Surveillance PET showed  1. No evidence of lymphoma recurrence. No lymphadenopathy. Normal spleen and bone marrow.2. Particular attention directed to the RIGHT axilla.3. Benign cyst of the LEFT ovary   03/03/2022 Mammogram   Patient palpated left breast mass with rapid growth.   Bilateral diagnostic mammogram/ left axilla  showed 1. Suspicious 2.4 cm mass along the 11 o'clock axis of the left breast. Recommend ultrasound-guided biopsy. 2. No suspicious left axillary lymphadenopathy. 3. No mammographic evidence of malignancy on the right.   03/06/2022 Relapse/Recurrence   Left breast mass biopsy showed ALK positive anaplastic large cell lymphoma. Ki67 100% Flowcytometry is overall non contributory.    03/15/2022  Imaging   PET restaging showed  1. New hypermetabolic round mass in the medial LEFT breast consistent with lymphoma recurrence. 2. New lesion in the mid body of the pancreas consistent with lymphoma recurrence/metastasis. 3. Very small hypermetabolic RIGHT supraclavicular node is concerning for lymphoma. 4. Two pulmonary nodules in the RIGHT lung are new from prior. Indeterminate nodules.    03/21/2022 Cancer Staging   Staging form: Hodgkin and Non-Hodgkin Lymphoma, AJCC 8th Edition - Clinical stage from 03/21/2022: Stage IV - Signed by Earlie Server, MD on 04/08/2022 Histopathologic type: Anaplastic large cell lymphoma, T cell and Null cell type Stage prefix: Recurrence   03/28/2022 Echocardiogram   2D Echo LVEF 60-65%   03/28/2022 Bone Marrow Biopsy   Bone marrow biopsy showed  Aspirate : no immunophenotypic evidence of a lymphoproliferative disorder (no monoclonal B cells or immunophenotypically abnormal T cells detected).  Core biopsy showed Hypocellular bone marrow (30%) with otherwise orderly trilineage hematopoiesis. -No morphologic or immunohistochemical evidence of the patient's known  recurrent anaplastic large cell lymphoma.      04/04/2022 -  Chemotherapy   Patient is on Treatment Plan : NON-HODGKINS LYMPHOMA ICE q21d   Intrathecal with MTX 45m, Ara C 445mand hydrocortisone 100 mg    04/04/2022 Procedure   Lumbar puncture   CSF flowcytometry  Insufficient B and T cells detected to adequately evaluate for clonality or aberrancy in a low cellularity specimen,    04/20/2022 Procedure   S/p placement of medi port by Dr.Dew    Patient presents  post  chemotherapy evaluation.  She feels ok today.  Left breast mass is no long painful. Headache/neck pain  has resolved.  No nausea or vomiting.   Review of Systems  Constitutional:  Positive for appetite change. Negative for chills, fatigue, fever and unexpected weight change.  HENT:   Negative for hearing loss and voice  change.   Eyes:  Negative for eye problems.  Respiratory:  Negative for chest tightness, cough and shortness of breath.   Cardiovascular:  Negative for chest pain.  Gastrointestinal:  Positive for nausea and vomiting. Negative for abdominal distention, abdominal pain and blood in stool.  Endocrine: Negative for hot flashes.  Genitourinary:  Negative for difficulty urinating and frequency.   Musculoskeletal:  Negative for arthralgias.       Neck pain  Skin:  Negative for itching and rash.  Neurological:  Positive for headaches. Negative for extremity weakness.  Hematological:  Negative for adenopathy.  Psychiatric/Behavioral:  Negative for confusion.     MEDICAL HISTORY:  Past Medical History:  Diagnosis Date   Anxiety    Depression    Medical history non-contributory     SURGICAL HISTORY: Past Surgical History:  Procedure Laterality Date   BREAST BIOPSY Left 03/06/2022   [place] LT BREAST BX W LOC DEV 1ST LESION IMG BX SPEC US GUIDE 03/06/2022 [facility] MAMMOGRAPHY   DILATION AND CURETTAGE OF UTERUS     DILATION AND EVACUATION N/A 02/17/2017   Procedure: DILATATION AND EVACUATION;  Surgeon: Janyth Pupa, DO;  Location: [facility];  Service: Gynecology;  Laterality: N/A;   IR FL GUIDED LOC OF NEEDLE/CATH TIP FOR SPINAL INJECTION LT  04/04/2022   IR FLUORO GUIDED NEEDLE PLC ASPIRATION/INJECTION LOC  04/07/2022   PORTA CATH INSERTION N/A 02/23/2020   Procedure: PORTA CATH INSERTION;  Surgeon: Algernon Huxley, MD;  Location: [facility];  Service: Cardiovascular;  Laterality: N/A;   PORTA CATH REMOVAL N/A 09/13/2020   Procedure: PORTA CATH REMOVAL;  Surgeon: Algernon Huxley, MD;  Location: [facility];  Service: Cardiovascular;  Laterality: N/A;    SOCIAL HISTORY: Social History   Socioeconomic History   Marital status: Married    Spouse name: Cody   Number of children: 1   Years of education: Not on file   Highest education level: Not on file  Occupational History    Occupation: social media- marketing   Tobacco Use   Smoking status: Never   Smokeless tobacco: Never  Vaping Use   Vaping Use: Never used  Substance and Sexual Activity   Alcohol use: Yes    Comment: socially   Drug use: No   Sexual activity: Yes    Birth control/protection: None  Other Topics Concern   Not on file  Social History Narrative   Not on file   Social Determinants of Health   Financial Resource Strain: Low Risk  (01/16/2019)   Overall Financial Resource Strain (CARDIA)    Difficulty of Paying Living Expenses: Not hard at all  Food Insecurity: No Food Insecurity (01/16/2019)   Hunger Vital Sign    Worried About Running Out of Food in the Last Year: Never true    Ran Out of Food in the Last Year: Never true  Transportation Needs: No Transportation Needs (01/16/2019)  PRAPARE - Hydrologist (Medical): No    Lack of Transportation (Non-Medical): No  Physical Activity: Unknown (01/19/2019)   Exercise Vital Sign    Days of Exercise per Week: Patient refused    Minutes of Exercise per Session: Patient refused  Stress: No Stress Concern Present (01/19/2019)   [place]    Feeling of Stress : Only a little  Social Connections: Unknown (01/19/2019)   Social Connection and Isolation Panel [NHANES]    Frequency of Communication with Friends and Family: Patient refused    Frequency of Social Gatherings with Friends and Family: Patient refused    Attends Religious Services: Patient refused    Active Member of Clubs or Organizations: Patient refused    Attends Archivist Meetings: Patient refused    Marital Status: Patient refused  Intimate Partner Violence: Not At Risk (01/19/2019)   Humiliation, Afraid, Rape, and Kick questionnaire    Fear of Current or Ex-Partner: No    Emotionally Abused: No    Physically Abused: No    Sexually Abused: No    FAMILY HISTORY: Family  History  Problem Relation Age of Onset   Asthma Maternal Aunt    Hypertension Maternal Grandmother    Ovarian cancer Paternal Grandmother        dx 51s-80s    ALLERGIES:  has No Known Allergies.  MEDICATIONS:  Current Outpatient Medications  Medication Sig Dispense Refill   acetaminophen (TYLENOL) 500 MG tablet Take 500 mg by mouth every 6 (six) hours as needed.     allopurinol (ZYLOPRIM) 300 MG tablet TAKE 1 TABLET BY MOUTH EVERY DAY 90 tablet 0   butalbital-acetaminophen-caffeine (FIORICET) 50-325-40 MG tablet Take 1 tablet by mouth every 6 (six) hours as needed for headache. 20 tablet 0   chlorhexidine (PERIDEX) 0.12 % solution Use as directed 5 mLs in the mouth or throat 2 (two) times daily. 473 mL 0   dexamethasone (DECADRON) 4 MG tablet Take 2 tablets (8 mg total) by mouth daily. Start the day after chemotherapy for 2 days. Take with food. 30 tablet 1   docusate sodium (COLACE) 100 MG capsule Take 1 capsule (100 mg total) by mouth 2 (two) times daily. 10 capsule 0   LORazepam (ATIVAN) 0.5 MG tablet Take 1 tablet (0.5 mg total) by mouth every 6 (six) hours as needed for anxiety. 60 tablet 0   oxyCODONE (OXY IR/ROXICODONE) 5 MG immediate release tablet Take 1-2 tablets (5-10 mg total) by mouth every 6 (six) hours as needed for severe pain. 90 tablet 0   prochlorperazine (COMPAZINE) 10 MG tablet Take 1 tablet (10 mg total) by mouth every 6 (six) hours as needed for nausea or vomiting. 30 tablet 1   promethazine (PHENERGAN) 25 MG tablet Take 1 tablet (25 mg total) by mouth every 6 (six) hours as needed for nausea or vomiting. 120 tablet 2   venlafaxine (EFFEXOR) 37.5 MG tablet TAKE 1 TABLET BY MOUTH EVERY DAY 90 tablet 1   escitalopram (LEXAPRO) 10 MG tablet Take 10 mg by mouth daily. (Patient not taking: Reported on 04/25/2022)     escitalopram (LEXAPRO) 5 MG tablet Take 5 mg by mouth daily. (Patient not taking: Reported on 03/21/2022)     lidocaine-prilocaine (EMLA) cream Apply small  amount to port-a-cath site approximately 1-2 hours to port being accessed 30 g 3   loperamide (IMODIUM) 2 MG capsule Take 1 capsule (2 mg total) by mouth  See admin instructions. Initial: 4 mg, followed by 2 mg every 2 to 4 hours or after each loose stool; Max 16 mg /24 hours (Patient not taking: Reported on 09/13/2020) 60 capsule 1   naloxone (NARCAN) nasal spray 4 mg/0.1 mL SPRAY 1 SPRAY INTO ONE NOSTRIL AS DIRECTED FOR OPIOID OVERDOSE (TURN PERSON ON SIDE AFTER DOSE. IF NO RESPONSE IN 2-3 MINUTES OR PERSON RESPONDS BUT RELAPSES, REPEAT USING A NEW SPRAY DEVICE AND SPRAY INTO THE OTHER NOSTRIL. CALL 911 AFTER USE.) * EMERGENCY USE ONLY * (Patient not taking: Reported on 04/25/2022) 1 each 0   ondansetron (ZOFRAN) 8 MG tablet Take 1 tablet (8 mg total) by mouth every 8 (eight) hours as needed for nausea or vomiting. Start 2 days after chemotherapy. (Patient not taking: Reported on 03/31/2022) 30 tablet 1   No current facility-administered medications for this visit.     PHYSICAL EXAMINATION: ECOG PERFORMANCE STATUS: 1 - Symptomatic but completely ambulatory Vitals:   04/25/22 0939  BP: (!) 110/48  Pulse: 83  Resp: 18  Temp: 98.2 F (36.8 C)   Filed Weights   04/25/22 0939  Weight: 157 lb 3.2 oz (71.3 kg)     Physical Exam Constitutional:      General: She is not in acute distress. HENT:     Head: Normocephalic and atraumatic.  Eyes:     General: No scleral icterus. Cardiovascular:     Rate and Rhythm: Normal rate and regular rhythm.  Pulmonary:     Effort: Pulmonary effort is normal. No respiratory distress.     Breath sounds: No wheezing.  Abdominal:     General: Bowel sounds are normal. There is no distension.     Palpations: Abdomen is soft.  Musculoskeletal:        General: No deformity. Normal range of motion.     Cervical back: Normal range of motion and neck supple.  Skin:    General: Skin is warm and dry.     Findings: Lesion present.  Neurological:     Mental  Status: She is alert and oriented to person, place, and time. Mental status is at baseline.     Cranial Nerves: No cranial nerve deficit.     Coordination: Coordination normal.  Psychiatric:        Mood and Affect: Mood normal.   04/04/22   LABORATORY DATA:  I have reviewed the data as listed     Latest Ref Rng & Units 04/25/2022    9:14 AM 04/12/2022    8:30 AM 04/07/2022    8:30 AM  CBC  WBC 4.0 - 10.5 K/uL 5.3  2.5  9.3   Hemoglobin 12.0 - 15.0 g/dL 11.2  12.2  11.7   Hematocrit 36.0 - 46.0 % 33.7  37.1  35.8   Platelets 150 - 400 K/uL 171  164  291       Latest Ref Rng & Units 04/25/2022    9:14 AM 04/12/2022    8:30 AM 04/04/2022    1:13 PM  CMP  Glucose 70 - 99 mg/dL 80  108  136   BUN 6 - 20 mg/dL _0 Creatinine 0.44 - 1.00 mg/dL 0.63  0.65  0.75   Sodium 135 - 145 mmol/L 137  136  136   Potassium 3.5 - 5.1 mmol/L 4.0  3.9  3.8   Chloride 98 - 111 mmol/L 106  104  105   CO2 22 - 32 mmol/L 26  24  19   Calcium 8.9 - 10.3 mg/dL 8.8  9.1  9.1   Total Protein 6.5 - 8.1 g/dL 7.2  7.8  7.8   Total Bilirubin 0.3 - 1.2 mg/dL 0.5  0.7  0.6   Alkaline Phos 38 - 126 U/L 61  57  64   AST 15 - 41 U/L 25  32  14   ALT 0 - 44 U/L 20  82  11     Iron/TIBC/Ferritin/ %Sat No results found for: "IRON", "TIBC", "FERRITIN", "IRONPCTSAT"    RADIOGRAPHIC STUDIES: I have personally reviewed the radiological images as listed and agreed with the findings in the report. PERIPHERAL VASCULAR CATHETERIZATION  Result Date: 04/20/2022 See surgical note for result.  CT HEAD WO CONTRAST (5MM)  Result Date: 04/13/2022 CLINICAL DATA:  Headache after spinal puncture, history of anaplastic large-cell lymphoma of axilla EXAM: CT HEAD WITHOUT CONTRAST TECHNIQUE: Contiguous axial images were obtained from the base of the skull through the vertex without intravenous contrast. RADIATION DOSE REDUCTION: This exam was performed according to the departmental dose-optimization program which  includes automated exposure control, adjustment of the mA and/or kV according to patient size and/or use of iterative reconstruction technique. COMPARISON:  03/24/2022 FINDINGS: Brain: No acute infarct or hemorrhage. Lateral ventricles and midline structures are unremarkable. No acute extra-axial fluid collections. No mass effect. Vascular: No hyperdense vessel or unexpected calcification. Skull: Normal. Negative for fracture or focal lesion. Sinuses/Orbits: No acute finding. Other: None. IMPRESSION: 1. No acute intracranial process. Electronically Signed   By: Randa Ngo M.D.   On: 04/13/2022 16:38   IR Fluoro Guide Ndl Plmt / BX  Result Date: 04/10/2022 CLINICAL DATA:  Persistent severe headache after lumbar puncture FLUOROSCOPY: Radiation Exposure Index (as provided by the fluoroscopic device): 0.6 minutes (59 mGy) PROCEDURE: Epidural blood patch using fluoroscopic guidance The procedure, risks, benefits, and alternatives were explained to the patient. Questions regarding the procedure were encouraged and answered. The patient understands and consents to the procedure. LUMBAR EPIDURAL INJECTION: An interlaminar approach was performed on left at L3-L4. This corresponded to the same level of access based on comparison imaging from lumbar puncture dated 04/04/2022. The overlying skin was cleansed and anesthetized. A 20 gauge epidural needle was advanced using loss-of-resistance technique. DIAGNOSTIC EPIDURAL INJECTION: Injection of Isovue-M 200 shows a good epidural pattern with spread above and below the level of needle placement, primarily on the midline. No vascular opacification is seen. THERAPEUTIC EPIDURAL INJECTION: Approximately 15 mL of freshly drawn autologous blood was then administered. The procedure was well-tolerated, and the patient was discharged thirty minutes following the injection in good condition. COMPLICATIONS: None immediate IMPRESSION: Technically successful epidural blood patch  using fluoroscopic guidance. Electronically Signed   By: Albin Felling M.D.   On: 04/10/2022 12:13   IR FL GUIDED LOC OF NEEDL/CATH TIP FOR SPINAL INJECT LT  Result Date: 04/04/2022 CLINICAL DATA:  Provided history: Anaplastic ALK-positive large-cell lymphoma of lymph node of axilla. Request for lumbar puncture and intrathecal administration of methotrexate, cytarabine and hydrocortisone. EXAM: LUMBAR PUNCTURE UNDER FLUOROSCOPY PROCEDURE: Prior to the procedure, Rushie Nyhan, NP obtained informed consent from the patient. This process included a discussion of procedural risks. An appropriate skin entry site was determined under fluoroscopy and marked. A time-out was performed. The operated donned sterile gloves and a mask. The skin entry site was prepped with Betadine, draped in the usual sterile fashion and infiltrated locally with 1% lidocaine. Subsequently under intermittent fluoroscopic guidance, a 20-gauge spinal  needle was advanced into the thecal sac at the L4-L5 level. There was spontaneous return of clear CSF. 6 mL of CSF were collected for laboratory studies. Subsequently, a chemotherapy solution was injected into the thecal sac (12 mg methotrexate, 100 mg Solu-Cortef, 40 mg cytarabine). The inner stylet was then replaced within the needle and the needle was removed in its entirety. The patient tolerated the procedure well, and no immediate post-procedure complication was apparent. The examination was performed by Rushie Nyhan, NP, and was supervised and interpreted by Dr. Kellie Simmering. FLUOROSCOPY: Fluoroscopy time: 1 minute, 18 seconds (17.5 mGy). IMPRESSION: Technically successful fluoroscopically-guided L4-L5 lumbar puncture. 6 mL of CSF collected for laboratory studies. Intrathecal injection of chemotherapy and steroid. No immediate post-procedure complication. Electronically Signed   By: Kellie Simmering D.O.   On: 04/04/2022 11:23   ECHOCARDIOGRAM COMPLETE  Result Date: 03/31/2022     ECHOCARDIOGRAM REPORT   Patient Name:   Angel Tate Date of Exam: 03/31/2022 Medical Rec #:  [id number]          Height:       62.0 in Accession #:    [id number]         Weight:       162.0 lb Date of Birth:  24-Oct-1990          BSA:          1.748 m Patient Age:    31 years           BP:           121/71 mmHg Patient Gender: F                  HR:           89 bpm. Exam Location:  [facility] Procedure: 2D Echo, Color Doppler, Cardiac Doppler and Strain Analysis Indications:     Z09 Encounter for monitoring cardiotoxic drug therapy  History:         Patient has prior history of Echocardiogram examinations, most                  recent 02/18/2020. Anaplastic ALK-positive large cell lymphoma                  of lymph node of axilla.  Sonographer:     Charmayne Sheer Referring Phys:  [id number] Jinnifer Montejano Diagnosing Phys: Ida Rogue MD  Sonographer Comments: Tumor on left breast. Global longitudinal strain was attempted. IMPRESSIONS  1. Left ventricular ejection fraction, by estimation, is 60 to 65%. The left ventricle has normal function. The left ventricle has no regional wall motion abnormalities. Left ventricular diastolic parameters were normal. The average left ventricular global longitudinal strain is -18.9 %.  2. Right ventricular systolic function is normal. The right ventricular size is normal.  3. The mitral valve is normal in structure. Mild mitral valve regurgitation. No evidence of mitral stenosis.  4. The aortic valve was not well visualized. Aortic valve regurgitation is not visualized. No aortic stenosis is present.  5. The inferior vena cava is normal in size with greater than 50% respiratory variability, suggesting right atrial pressure of 3 mmHg. FINDINGS  Left Ventricle: Left ventricular ejection fraction, by estimation, is 60 to 65%. The left ventricle has normal function. The left ventricle has no regional wall motion abnormalities. The average left ventricular global longitudinal strain is -18.9 %. The  left ventricular internal cavity size was normal in size. There is no left ventricular hypertrophy. Left ventricular  diastolic parameters were normal. Right Ventricle: The right ventricular size is normal. No increase in right ventricular wall thickness. Right ventricular systolic function is normal. Left Atrium: Left atrial size was normal in size. Right Atrium: Right atrial size was normal in size. Pericardium: There is no evidence of pericardial effusion. Mitral Valve: The mitral valve is normal in structure. Mild mitral valve regurgitation. No evidence of mitral valve stenosis. Tricuspid Valve: The tricuspid valve is normal in structure. Tricuspid valve regurgitation is not demonstrated. No evidence of tricuspid stenosis. Aortic Valve: The aortic valve was not well visualized. Aortic valve regurgitation is not visualized. No aortic stenosis is present. Aortic valve mean gradient measures 4.0 mmHg. Aortic valve peak gradient measures 7.2 mmHg. Aortic valve area, by VTI measures 2.54 cm. Pulmonic Valve: The pulmonic valve was normal in structure. Pulmonic valve regurgitation is not visualized. No evidence of pulmonic stenosis. Aorta: The aortic root is normal in size and structure. Venous: The inferior vena cava is normal in size with greater than 50% respiratory variability, suggesting right atrial pressure of 3 mmHg. IAS/Shunts: No atrial level shunt detected by color flow Doppler.  LEFT VENTRICLE PLAX 2D LVIDd:         4.70 cm   Diastology LVIDs:         3.10 cm   LV e' medial:    11.60 cm/s LV PW:         1.20 cm   LV E/e' medial:  7.1 LV IVS:        0.80 cm   LV e' lateral:   19.70 cm/s LVOT diam:     2.20 cm   LV E/e' lateral: 4.2 LV SV:         67 LV SV Index:   38        2D Longitudinal Strain LVOT Area:     3.80 cm  2D Strain GLS Avg:     -18.9 %  RIGHT VENTRICLE RV Basal diam:  3.80 cm RV S prime:     14.10 cm/s LEFT ATRIUM             Index        RIGHT ATRIUM           Index LA diam:        3.20 cm  1.83 cm/m   RA Area:     19.50 cm LA Vol (A2C):   29.2 ml 16.71 ml/m  RA Volume:   58.90 ml  33.70 ml/m LA Vol (A4C):   35.7 ml 20.42 ml/m LA Biplane Vol: 34.3 ml 19.62 ml/m  AORTIC VALVE                    PULMONIC VALVE AV Area (Vmax):    2.79 cm     PV Vmax:       1.21 m/s AV Area (Vmean):   2.54 cm     PV Vmean:      78.400 cm/s AV Area (VTI):     2.54 cm     PV VTI:        0.227 m AV Vmax:           134.00 cm/s  PV Peak grad:  5.9 mmHg AV Vmean:          90.400 cm/s  PV Mean grad:  3.0 mmHg AV VTI:            0.263 m AV Peak Grad:      7.2  mmHg AV Mean Grad:      4.0 mmHg LVOT Vmax:         98.20 cm/s LVOT Vmean:        60.300 cm/s LVOT VTI:          0.176 m LVOT/AV VTI ratio: 0.67  AORTA Ao Root diam: 2.70 cm MITRAL VALVE MV Area (PHT): 4.77 cm    SHUNTS MV Decel Time: 159 msec    Systemic VTI:  0.18 m MV E velocity: 82.30 cm/s  Systemic Diam: 2.20 cm MV A velocity: 57.60 cm/s MV E/A ratio:  1.43 Ida Rogue MD Electronically signed by Ida Rogue MD Signature Date/Time: 03/31/2022/12:46:09 PM    Final    CT BONE MARROW BIOPSY & ASPIRATION  Result Date: 03/28/2022 CLINICAL DATA:  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 EXAM: CT GUIDED BONE MARROW ASPIRATION AND BIOPSY ANESTHESIA/SEDATION: Moderate (conscious) sedation was employed during this procedure. A total of Versed 2.0 mg and Fentanyl 100 mcg was administered intravenously by radiology nursing. Moderate Sedation Time: 27 minutes. The patient's level of consciousness and vital signs were monitored continuously by radiology nursing throughout the procedure under my direct supervision. PROCEDURE: The procedure risks, benefits, and alternatives were explained to the patient. Questions regarding the procedure were encouraged and answered. The patient understands and consents to the procedure. A time out was performed prior to initiating the procedure. The right gluteal region was prepped with  chlorhexidine. Sterile gown and sterile gloves were used for the procedure. Local anesthesia was provided with 1% Lidocaine. Under CT guidance, an 11 gauge On Control bone cutting needle was advanced from a posterior approach into the right iliac bone. Needle positioning was confirmed with CT. Initial non heparinized and heparinized aspirate samples were obtained of bone marrow. Core biopsy was performed via the On Control drill needle. Two separate core biopsy samples were obtained. COMPLICATIONS: None FINDINGS: Inspection of initial non heparinized aspirate for particles was hampered by clotting. For this reason, 2 separate core biopsy samples were obtained. IMPRESSION: CT guided bone marrow biopsy of right posterior iliac bone with both aspirate and core samples obtained. Electronically Signed   By: Aletta Edouard M.D.   On: 03/28/2022 11:07

## 2022-04-26 ENCOUNTER — Inpatient Hospital Stay: Payer: BC Managed Care – PPO

## 2022-04-26 VITALS — BP 110/51 | HR 90 | Temp 97.5°F | Resp 16

## 2022-04-26 DIAGNOSIS — C8468 Anaplastic large cell lymphoma, ALK-positive, lymph nodes of multiple sites: Secondary | ICD-10-CM | POA: Diagnosis not present

## 2022-04-26 DIAGNOSIS — C8464 Anaplastic large cell lymphoma, ALK-positive, lymph nodes of axilla and upper limb: Secondary | ICD-10-CM

## 2022-04-26 MED ORDER — SODIUM CHLORIDE 0.9 % IV SOLN
Freq: Once | INTRAVENOUS | Status: AC
  Filled 2022-04-26: qty 54

## 2022-04-26 MED ORDER — SODIUM CHLORIDE 0.9% FLUSH
10.0000 mL | INTRAVENOUS | Status: DC | PRN
  Administered 2022-04-26: 10 mL
  Filled 2022-04-26: qty 10

## 2022-04-26 MED ORDER — SODIUM CHLORIDE 0.9 % IV SOLN
100.0000 mg/m2 | Freq: Once | INTRAVENOUS | Status: AC
  Administered 2022-04-26: 180 mg via INTRAVENOUS
  Filled 2022-04-26: qty 9

## 2022-04-26 MED ORDER — PALONOSETRON HCL INJECTION 0.25 MG/5ML
0.2500 mg | Freq: Once | INTRAVENOUS | Status: AC
  Administered 2022-04-26: 0.25 mg via INTRAVENOUS
  Filled 2022-04-26: qty 5

## 2022-04-26 MED ORDER — HEPARIN SOD (PORK) LOCK FLUSH 100 UNIT/ML IV SOLN
500.0000 [IU] | Freq: Once | INTRAVENOUS | Status: AC | PRN
  Administered 2022-04-26: 500 [IU]
  Filled 2022-04-26: qty 5

## 2022-04-26 MED ORDER — SODIUM CHLORIDE 0.9 % IV SOLN
10.0000 mg | Freq: Once | INTRAVENOUS | Status: AC
  Administered 2022-04-26: 10 mg via INTRAVENOUS
  Filled 2022-04-26: qty 1

## 2022-04-26 MED ORDER — SODIUM CHLORIDE 0.9 % IV SOLN
Freq: Once | INTRAVENOUS | Status: AC
  Filled 2022-04-26: qty 250

## 2022-04-26 MED ORDER — SODIUM CHLORIDE 0.9 % IV SOLN
400.0000 mg/m2 | Freq: Once | INTRAVENOUS | Status: AC
  Administered 2022-04-26: 700 mg via INTRAVENOUS
  Filled 2022-04-26: qty 7

## 2022-04-26 MED ORDER — SODIUM CHLORIDE 0.9 % IV SOLN
Freq: Once | INTRAVENOUS | Status: DC
  Filled 2022-04-26: qty 250

## 2022-04-26 MED FILL — Fosaprepitant Dimeglumine For IV Infusion 150 MG (Base Eq): INTRAVENOUS | Qty: 5 | Status: AC

## 2022-04-26 MED FILL — Dexamethasone Sodium Phosphate Inj 100 MG/10ML: INTRAMUSCULAR | Qty: 1 | Status: AC

## 2022-04-26 NOTE — Patient Instructions (Signed)
[facility] AT [place]  Discharge Instructions: Thank you for choosing [facility] to provide your oncology and hematology care.  If you have a lab appointment with the [facility], please go directly to the [facility] and check in at the registration area.  Wear comfortable clothing and clothing appropriate for easy access to any Portacath or PICC line.   We strive to give you quality time with your provider. You may need to reschedule your appointment if you arrive late (15 or more minutes).  Arriving late affects you and other patients whose appointments are after yours.  Also, if you miss three or more appointments without notifying the office, you may be dismissed from the clinic at the provider's discretion.      For prescription refill requests, have your pharmacy contact our office and allow 72 hours for refills to be completed.    Today you received the following chemotherapy and/or immunotherapy agents ETOPOSIDE, MESNA, IFOSAMIDE    To help prevent nausea and vomiting after your treatment, we encourage you to take your nausea medication as directed.  BELOW ARE SYMPTOMS THAT SHOULD BE REPORTED IMMEDIATELY: *FEVER GREATER THAN 100.4 F (38 C) OR HIGHER *CHILLS OR SWEATING *NAUSEA AND VOMITING THAT IS NOT CONTROLLED WITH YOUR NAUSEA MEDICATION *UNUSUAL SHORTNESS OF BREATH *UNUSUAL BRUISING OR BLEEDING *URINARY PROBLEMS (pain or burning when urinating, or frequent urination) *BOWEL PROBLEMS (unusual diarrhea, constipation, pain near the anus) TENDERNESS IN MOUTH AND THROAT WITH OR WITHOUT PRESENCE OF ULCERS (sore throat, sores in mouth, or a toothache) UNUSUAL RASH, SWELLING OR PAIN  UNUSUAL VAGINAL DISCHARGE OR ITCHING   Items with * indicate a potential emergency and should be followed up as soon as possible or go to the Emergency Department if any problems should occur.  Please show the CHEMOTHERAPY ALERT CARD or IMMUNOTHERAPY ALERT CARD  at check-in to the Emergency Department and triage nurse.  Should you have questions after your visit or need to cancel or reschedule your appointment, please contact [facility] [place] AT [place]  [phone] and follow the prompts.  Office hours are 8:00 a.m. to 4:30 p.m. Monday - Friday. Please note that voicemails left after 4:00 p.m. may not be returned until the following business day.  We are closed weekends and major holidays. You have access to a nurse at all times for urgent questions. Please call the main number to the clinic [phone] and follow the prompts.  For any non-urgent questions, you may also contact your provider using MyChart. We now offer e-Visits for anyone 15 and older to request care online for non-urgent symptoms. For details visit [email or web address].   Also download the MyChart app! Go to the app store, search "MyChart", open the app, select [facility], and log in with your MyChart username and password.  Masks are optional in the cancer centers. If you would like for your care team to wear a mask while they are taking care of you, please let them know. For doctor visits, patients may have with them one support person who is at least 30 years old. At this time, visitors are not allowed in the infusion area.  Etoposide Injection What is this medication? ETOPOSIDE (e toe POE side) treats some types of cancer. It works by slowing down the growth of cancer cells. This medicine may be used for other purposes; ask your health care provider or pharmacist if you have questions. COMMON BRAND NAME(S): Etopophos, Toposar, VePesid What should I tell my  care team before I take this medication? They need to know if you have any of these conditions: Infection Kidney disease Liver disease Low blood counts, such as low white cell, platelet, red cell counts An unusual or allergic reaction to etoposide, other medications, foods, dyes, or preservatives If you  or your partner are pregnant or trying to get pregnant Breastfeeding How should I use this medication? This medication is injected into a vein. It is given by your care team in a hospital or clinic setting. Talk to your care team about the use of this medication in children. Special care may be needed. Overdosage: If you think you have taken too much of this medicine contact a poison control center or emergency room at once. NOTE: This medicine is only for you. Do not share this medicine with others. What if I miss a dose? Keep appointments for follow-up doses. It is important not to miss your dose. Call your care team if you are unable to keep an appointment. What may interact with this medication? Warfarin This list may not describe all possible interactions. Give your health care provider a list of all the medicines, herbs, non-prescription drugs, or dietary supplements you use. Also tell them if you smoke, drink alcohol, or use illegal drugs. Some items may interact with your medicine. What should I watch for while using this medication? Your condition will be monitored carefully while you are receiving this medication. This medication may make you feel generally unwell. This is not uncommon as chemotherapy can affect healthy cells as well as cancer cells. Report any side effects. Continue your course of treatment even though you feel ill unless your care team tells you to stop. This medication can cause serious side effects. To reduce the risk, your care team may give you other medications to take before receiving this one. Be sure to follow the directions from your care team. This medication may increase your risk of getting an infection. Call your care team for advice if you get a fever, chills, sore throat, or other symptoms of a cold or flu. Do not treat yourself. Try to avoid being around people who are sick. This medication may increase your risk to bruise or bleed. Call your care team if  you notice any unusual bleeding. Talk to your care team about your risk of cancer. You may be more at risk for certain types of cancers if you take this medication. Talk to your care team if you may be pregnant. Serious birth defects can occur if you take this medication during pregnancy and for 6 months after the last dose. You will need a negative pregnancy test before starting this medication. Contraception is recommended while taking this medication and for 6 months after the last dose. Your care team can help you find the option that works for you. If your partner can get pregnant, use a condom during sex while taking this medication and for 4 months after the last dose. Do not breastfeed while taking this medication. This medication may cause infertility. Talk to your care team if you are concerned about your fertility. What side effects may I notice from receiving this medication? Side effects that you should report to your care team as soon as possible: Allergic reactions--skin rash, itching, hives, swelling of the face, lips, tongue, or throat Infection--fever, chills, cough, sore throat, wounds that don't heal, pain or trouble when passing urine, general feeling of discomfort or being unwell Low red blood cell level--unusual weakness  or fatigue, dizziness, headache, trouble breathing Unusual bruising or bleeding Side effects that usually do not require medical attention (report to your care team if they continue or are bothersome): Diarrhea Fatigue Hair loss Loss of appetite Nausea Vomiting This list may not describe all possible side effects. Call your doctor for medical advice about side effects. You may report side effects to FDA at 1-800-FDA-1088. Where should I keep my medication? This medication is given in a hospital or clinic. It will not be stored at home. NOTE: This sheet is a summary. It may not cover all possible information. If you have questions about this medicine, talk  to your doctor, pharmacist, or health care provider.  2023 Elsevier/Gold Standard (2007-06-08 00:00:00)  Mesna Injection What is this medication? MESNA (MES na) reduces the risk of bleeding in the bladder caused by ifosfamide, a type of chemotherapy. It works by protecting your bladder from substances in the urine that may irritate it. This medicine may be used for other purposes; ask your health care provider or pharmacist if you have questions. COMMON BRAND NAME(S): Mesnex What should I tell my care team before I take this medication? They need to know if you have any of these conditions: An unusual or allergic reaction to mesna, other medications, foods, dyes, or preservatives Pregnant or trying to get pregnant Breast-feeding How should I use this medication? This medication is injected into a vein. It is given by your care team in a hospital or clinic setting. Talk to your care team about the use of this medication in children. Special care may be needed. Overdosage: If you think you have taken too much of this medicine contact a poison control center or emergency room at once. NOTE: This medicine is only for you. Do not share this medicine with others. What if I miss a dose? Keep appointments for follow-up doses. It is important not to miss your dose. Call your care team if you are unable to keep an appointment. What may interact with this medication? Interactions are not expected. This list may not describe all possible interactions. Give your health care provider a list of all the medicines, herbs, non-prescription drugs, or dietary supplements you use. Also tell them if you smoke, drink alcohol, or use illegal drugs. Some items may interact with your medicine. What should I watch for while using this medication? Your condition will be monitored carefully while you are receiving this medication. Tell your care team right away if you see that your urine has turned a pink or red  color. It is important to drink at least a quart (4 cups) of fluids each day that you take this medication. This medication may cause serious skin reactions. They can happen weeks to months after starting the medication. Contact your care team right away if you notice fevers or flu-like symptoms with a rash. The rash may be red or purple and then turn into blisters or peeling of the skin. You may also notice a red rash with swelling of the face, lips, or lymph nodes in your neck or under your arms. Talk to your care team if you or your partner may be pregnant. Serious birth defects can occur if you take this medication during pregnancy and for 6 months after the last dose. You will need a negative pregnancy test before starting this medication. Contraception is recommended while taking this medication and for 6 months after the last dose. Your care team can help you find the option that  works for you. If your partner can get pregnant, use a condom during sex while taking this medication and for 3 months after the last dose. Do not breastfeed while taking this medication and for 1 week after the last dose. What side effects may I notice from receiving this medication? Side effects that you should report to your care team as soon as possible: Allergic reactions or angioedema--skin rash, itching or hives, swelling of the face, eyes, lips, tongue, arms, or legs, trouble swallowing or breathing Redness, blistering, peeling, or loosening of the skin, including inside the mouth Side effects that usually do not require medical attention (report to your care team if they continue or are bothersome): Constipation Diarrhea Fatigue Hair loss Headache Nausea This list may not describe all possible side effects. Call your doctor for medical advice about side effects. You may report side effects to FDA at 1-800-FDA-1088. Where should I keep my medication? This medication is given in a hospital or clinic. It will  not be stored at home. NOTE: This sheet is a summary. It may not cover all possible information. If you have questions about this medicine, talk to your doctor, pharmacist, or health care provider.  2023 Elsevier/Gold Standard (2021-08-26 00:00:00)  Ifosfamide Injection What is this medication? IFOSFAMIDE (eye FOS fa mide) treats some types of cancer. It works by slowing down the growth of cancer cells. This medicine may be used for other purposes; ask your health care provider or pharmacist if you have questions. COMMON BRAND NAME(S): Ifex, Ifex/Mesna What should I tell my care team before I take this medication? They need to know if you have any of these conditions: Heart disease History of irregular heartbeat Immune system problems Infection, such as chickenpox, cold sores, or herpes Infections in the bladder, kidneys, or urinary tract Kidney disease Low blood cell levels, such as white cells, platelets, or red blood cells Lung or breathing disease, such as asthma or COPD Problems urinating Recent or ongoing radiation therapy Tingling of the fingers or toes or other nerve disorder An unusual or allergic reaction to ifosfamide, other medications, foods, dyes, or preservatives Pregnant or trying to get pregnant Breast-feeding How should I use this medication? This medication is injected into a vein. It is given by your care team in a hospital or clinic setting. Talk to your care team about the use of this medication in children. Special care may be needed. Overdosage: If you think you have taken too much of this medicine contact a poison control center or emergency room at once. NOTE: This medicine is only for you. Do not share this medicine with others. What if I miss a dose? Keep appointments for follow-up doses. It is important not to miss your dose. Call your care team if you are unable to keep an appointment. What may interact with this medication? Do not take this medication  with any of the following: Live virus vaccines This medication may interact with the following: Aprepitant, fosaprepitant Certain medications for fungal infections, such as fluconazole, itraconazole, and ketoconazole Certain medications for seizures, such as carbamazepine, phenobarbital, phenytoin Grapefruit, grapefruit juice Rifampin St. John's wort This list may not describe all possible interactions. Give your health care provider a list of all the medicines, herbs, non-prescription drugs, or dietary supplements you use. Also tell them if you smoke, drink alcohol, or use illegal drugs. Some items may interact with your medicine. What should I watch for while using this medication? Visit your care team for checks on  your progress. This medication may make you feel generally unwell. This is not uncommon, as chemotherapy can affect healthy cells as well as cancer cells. Report any side effects. Continue your course of treatment even though you feel ill unless your care team tells you to stop. Drink water or other fluids as directed. Urinate often, even at night. In some cases, you may be given additional medications to help with side effects. Follow all directions for their use. This medication may increase your risk of getting an infection. Call your care team for advice if you get a fever, chills, sore throat, or other symptoms of a cold or flu. Do not treat yourself. Try to avoid being around people who are sick. This medication may increase your risk to bruise or bleed. Call your care team if you notice any unusual bleeding. Be careful brushing and flossing your teeth or using a toothpick because you may get an infection or bleed more easily. If you have any dental work done, tell your dentist you are receiving this medication. Avoid taking medications that contain aspirin, acetaminophen, ibuprofen, naproxen, or ketoprofen unless instructed by your care team. These medications may hide a  fever. Talk to your care team if you or your partner wish to become pregnant. This medication can cause serious birth defects if taken during pregnancy and for 6 months after stopping treatment. Do not father a child while taking this medication and for 6 months after stopping treatment. A reliable form of contraception is recommended while taking this medication and for 6 months after stopping treatment. Talk to your care team about reliable forms of contraception. Do not breast-feed while taking this medication. This medication may cause infertility. Talk to your care team if you are concerned about your fertility. What side effects may I notice from receiving this medication? Side effects that you should report to your care team as soon as possible: Allergic reactions--skin rash, itching, hives, swelling of the face, lips, tongue, or throat Dry cough, shortness of breath or trouble breathing Hallucinations Heart failure--shortness of breath, swelling of the ankles, feet, or hands, sudden weight gain, unusual weakness or fatigue Heart rhythm changes--fast or irregular heartbeat, dizziness, feeling faint or lightheaded, chest pain, trouble breathing Infection--fever, chills, cough, sore throat, wounds that don't heal, pain or trouble when passing urine, general feeling of discomfort or being unwell Kidney injury--decrease in the amount of urine, swelling of the ankles, hands, or feet Liver injury--right upper belly pain, loss of appetite, nausea, light-colored stool, dark yellow or brown urine, yellowing skin or eyes, unusual weakness or fatigue Low red blood cell level--unusual weakness or fatigue, dizziness, headache, trouble breathing Pain, tingling, or numbness in the hands or feet, muscle weakness, change in vision, confusion or trouble speaking, loss of balance or coordination, trouble walking, seizures Red or dark brown urine Unusual bruising or bleeding Unusual changes in behavior Side  effects that usually do not require medical attention (report to your care team if they continue or are bothersome): Hair loss Nausea Vomiting This list may not describe all possible side effects. Call your doctor for medical advice about side effects. You may report side effects to FDA at 1-800-FDA-1088. Where should I keep my medication? This medication is given in a hospital or clinic. It will not be stored at home. NOTE: This sheet is a summary. It may not cover all possible information. If you have questions about this medicine, talk to your doctor, pharmacist, or health care provider.  2023 Elsevier/Gold  Standard (2021-07-21 00:00:00)

## 2022-04-27 ENCOUNTER — Inpatient Hospital Stay: Payer: BC Managed Care – PPO

## 2022-04-27 VITALS — BP 115/55 | HR 93 | Temp 97.9°F | Resp 16

## 2022-04-27 DIAGNOSIS — C8464 Anaplastic large cell lymphoma, ALK-positive, lymph nodes of axilla and upper limb: Secondary | ICD-10-CM

## 2022-04-27 DIAGNOSIS — C8468 Anaplastic large cell lymphoma, ALK-positive, lymph nodes of multiple sites: Secondary | ICD-10-CM | POA: Diagnosis not present

## 2022-04-27 LAB — URINALYSIS, COMPLETE (UACMP) WITH MICROSCOPIC
Bilirubin Urine: NEGATIVE
Glucose, UA: NEGATIVE mg/dL
Hgb urine dipstick: NEGATIVE
Ketones, ur: 5 mg/dL — AB
Nitrite: NEGATIVE
Protein, ur: NEGATIVE mg/dL
Specific Gravity, Urine: 1.017 (ref 1.005–1.030)
pH: 5 (ref 5.0–8.0)

## 2022-04-27 LAB — CBC WITH DIFFERENTIAL/PLATELET
Abs Immature Granulocytes: 0.05 10*3/uL (ref 0.00–0.07)
Basophils Absolute: 0 10*3/uL (ref 0.0–0.1)
Basophils Relative: 0 %
Eosinophils Absolute: 0 10*3/uL (ref 0.0–0.5)
Eosinophils Relative: 0 %
HCT: 31.2 % — ABNORMAL LOW (ref 36.0–46.0)
Hemoglobin: 10.6 g/dL — ABNORMAL LOW (ref 12.0–15.0)
Immature Granulocytes: 1 %
Lymphocytes Relative: 22 %
Lymphs Abs: 1.5 10*3/uL (ref 0.7–4.0)
MCH: 30.5 pg (ref 26.0–34.0)
MCHC: 34 g/dL (ref 30.0–36.0)
MCV: 89.7 fL (ref 80.0–100.0)
Monocytes Absolute: 0.7 10*3/uL (ref 0.1–1.0)
Monocytes Relative: 11 %
Neutro Abs: 4.3 10*3/uL (ref 1.7–7.7)
Neutrophils Relative %: 66 %
Platelets: 211 10*3/uL (ref 150–400)
RBC: 3.48 MIL/uL — ABNORMAL LOW (ref 3.87–5.11)
RDW: 13.7 % (ref 11.5–15.5)
WBC: 6.6 10*3/uL (ref 4.0–10.5)
nRBC: 0 % (ref 0.0–0.2)

## 2022-04-27 LAB — PREGNANCY, URINE: Preg Test, Ur: NEGATIVE

## 2022-04-27 MED ORDER — HEPARIN SOD (PORK) LOCK FLUSH 100 UNIT/ML IV SOLN
500.0000 [IU] | Freq: Once | INTRAVENOUS | Status: DC | PRN
  Filled 2022-04-27: qty 5

## 2022-04-27 MED ORDER — SODIUM CHLORIDE 0.9 % IV SOLN
150.0000 mg | Freq: Once | INTRAVENOUS | Status: AC
  Administered 2022-04-27: 150 mg via INTRAVENOUS
  Filled 2022-04-27: qty 150

## 2022-04-27 MED ORDER — SODIUM CHLORIDE 0.9 % IV SOLN
Freq: Once | INTRAVENOUS | Status: AC
  Filled 2022-04-27: qty 250

## 2022-04-27 MED ORDER — SODIUM CHLORIDE 0.9 % IV SOLN
10.0000 mg | Freq: Once | INTRAVENOUS | Status: AC
  Administered 2022-04-27: 10 mg via INTRAVENOUS
  Filled 2022-04-27: qty 10

## 2022-04-27 MED ORDER — SODIUM CHLORIDE 0.9 % IV SOLN
Freq: Once | INTRAVENOUS | Status: AC
  Filled 2022-04-27: qty 54

## 2022-04-27 MED ORDER — SODIUM CHLORIDE 0.9 % IV SOLN
100.0000 mg/m2 | Freq: Once | INTRAVENOUS | Status: AC
  Administered 2022-04-27: 180 mg via INTRAVENOUS
  Filled 2022-04-27: qty 9

## 2022-04-27 MED ORDER — SODIUM CHLORIDE 0.9 % IV SOLN
724.0000 mg | Freq: Once | INTRAVENOUS | Status: AC
  Administered 2022-04-27: 720 mg via INTRAVENOUS
  Filled 2022-04-27: qty 72

## 2022-04-27 MED ORDER — SODIUM CHLORIDE 0.9 % IV SOLN
400.0000 mg/m2 | Freq: Once | INTRAVENOUS | Status: AC
  Administered 2022-04-27: 700 mg via INTRAVENOUS
  Filled 2022-04-27: qty 7

## 2022-04-27 MED FILL — Dexamethasone Sodium Phosphate Inj 100 MG/10ML: INTRAMUSCULAR | Qty: 1 | Status: AC

## 2022-04-27 NOTE — Patient Instructions (Signed)
[facility] CANCER CTR AT [place]  Discharge Instructions: Thank you for choosing [facility] to provide your oncology and hematology care.  If you have a lab appointment with the [facility], please go directly to the [facility] and check in at the registration area.  Wear comfortable clothing and clothing appropriate for easy access to any Portacath or PICC line.   We strive to give you quality time with your provider. You may need to reschedule your appointment if you arrive late (15 or more minutes).  Arriving late affects you and other patients whose appointments are after yours.  Also, if you miss three or more appointments without notifying the office, you may be dismissed from the clinic at the provider's discretion.      For prescription refill requests, have your pharmacy contact our office and allow 72 hours for refills to be completed.    Today you received the following chemotherapy and/or immunotherapy agents Vepesid, Paraplatin, Ifex      To help prevent nausea and vomiting after your treatment, we encourage you to take your nausea medication as directed.  BELOW ARE SYMPTOMS THAT SHOULD BE REPORTED IMMEDIATELY: *FEVER GREATER THAN 100.4 F (38 C) OR HIGHER *CHILLS OR SWEATING *NAUSEA AND VOMITING THAT IS NOT CONTROLLED WITH YOUR NAUSEA MEDICATION *UNUSUAL SHORTNESS OF BREATH *UNUSUAL BRUISING OR BLEEDING *URINARY PROBLEMS (pain or burning when urinating, or frequent urination) *BOWEL PROBLEMS (unusual diarrhea, constipation, pain near the anus) TENDERNESS IN MOUTH AND THROAT WITH OR WITHOUT PRESENCE OF ULCERS (sore throat, sores in mouth, or a toothache) UNUSUAL RASH, SWELLING OR PAIN  UNUSUAL VAGINAL DISCHARGE OR ITCHING   Items with * indicate a potential emergency and should be followed up as soon as possible or go to the Emergency Department if any problems should occur.  Please show the CHEMOTHERAPY ALERT CARD or IMMUNOTHERAPY ALERT CARD  at check-in to the Emergency Department and triage nurse.  Should you have questions after your visit or need to cancel or reschedule your appointment, please contact [facility] CANCER [place] AT [place]  [phone] and follow the prompts.  Office hours are 8:00 a.m. to 4:30 p.m. Monday - Friday. Please note that voicemails left after 4:00 p.m. may not be returned until the following business day.  We are closed weekends and major holidays. You have access to a nurse at all times for urgent questions. Please call the main number to the clinic [phone] and follow the prompts.  For any non-urgent questions, you may also contact your provider using MyChart. We now offer e-Visits for anyone 21 and older to request care online for non-urgent symptoms. For details visit [email or web address].   Also download the MyChart app! Go to the app store, search "MyChart", open the app, select [place], and log in with your MyChart username and password.

## 2022-04-28 ENCOUNTER — Inpatient Hospital Stay: Payer: BC Managed Care – PPO

## 2022-04-28 VITALS — BP 127/67 | HR 81 | Temp 96.0°F | Resp 17

## 2022-04-28 DIAGNOSIS — C8468 Anaplastic large cell lymphoma, ALK-positive, lymph nodes of multiple sites: Secondary | ICD-10-CM | POA: Diagnosis not present

## 2022-04-28 DIAGNOSIS — C8464 Anaplastic large cell lymphoma, ALK-positive, lymph nodes of axilla and upper limb: Secondary | ICD-10-CM

## 2022-04-28 LAB — CBC WITH DIFFERENTIAL/PLATELET
Abs Immature Granulocytes: 0.02 10*3/uL (ref 0.00–0.07)
Basophils Absolute: 0 10*3/uL (ref 0.0–0.1)
Basophils Relative: 1 %
Eosinophils Absolute: 0 10*3/uL (ref 0.0–0.5)
Eosinophils Relative: 0 %
HCT: 29.1 % — ABNORMAL LOW (ref 36.0–46.0)
Hemoglobin: 9.8 g/dL — ABNORMAL LOW (ref 12.0–15.0)
Immature Granulocytes: 0 %
Lymphocytes Relative: 26 %
Lymphs Abs: 1.4 10*3/uL (ref 0.7–4.0)
MCH: 30.2 pg (ref 26.0–34.0)
MCHC: 33.7 g/dL (ref 30.0–36.0)
MCV: 89.8 fL (ref 80.0–100.0)
Monocytes Absolute: 0.5 10*3/uL (ref 0.1–1.0)
Monocytes Relative: 10 %
Neutro Abs: 3.6 10*3/uL (ref 1.7–7.7)
Neutrophils Relative %: 63 %
Platelets: 219 10*3/uL (ref 150–400)
RBC: 3.24 MIL/uL — ABNORMAL LOW (ref 3.87–5.11)
RDW: 14 % (ref 11.5–15.5)
WBC: 5.7 10*3/uL (ref 4.0–10.5)
nRBC: 0 % (ref 0.0–0.2)

## 2022-04-28 LAB — URINALYSIS, COMPLETE (UACMP) WITH MICROSCOPIC
Bilirubin Urine: NEGATIVE
Glucose, UA: NEGATIVE mg/dL
Hgb urine dipstick: NEGATIVE
Ketones, ur: NEGATIVE mg/dL
Nitrite: NEGATIVE
Protein, ur: NEGATIVE mg/dL
Specific Gravity, Urine: 1.013 (ref 1.005–1.030)
pH: 6 (ref 5.0–8.0)

## 2022-04-28 LAB — PREGNANCY, URINE: Preg Test, Ur: NEGATIVE

## 2022-04-28 MED ORDER — SODIUM CHLORIDE 0.9 % IV SOLN
Freq: Once | INTRAVENOUS | Status: AC
  Filled 2022-04-28: qty 250

## 2022-04-28 MED ORDER — SODIUM CHLORIDE 0.9 % IV SOLN
100.0000 mg/m2 | Freq: Once | INTRAVENOUS | Status: AC
  Administered 2022-04-28: 180 mg via INTRAVENOUS
  Filled 2022-04-28: qty 9

## 2022-04-28 MED ORDER — PALONOSETRON HCL INJECTION 0.25 MG/5ML
0.2500 mg | Freq: Once | INTRAVENOUS | Status: AC
  Administered 2022-04-28: 0.25 mg via INTRAVENOUS
  Filled 2022-04-28: qty 5

## 2022-04-28 MED ORDER — SODIUM CHLORIDE 0.9 % IV SOLN
10.0000 mg | Freq: Once | INTRAVENOUS | Status: AC
  Administered 2022-04-28: 10 mg via INTRAVENOUS
  Filled 2022-04-28: qty 10

## 2022-04-28 MED ORDER — SODIUM CHLORIDE 0.9 % IV SOLN
Freq: Once | INTRAVENOUS | Status: AC
  Filled 2022-04-28: qty 54

## 2022-04-28 MED ORDER — SODIUM CHLORIDE 0.9 % IV SOLN
400.0000 mg/m2 | Freq: Once | INTRAVENOUS | Status: AC
  Administered 2022-04-28: 700 mg via INTRAVENOUS
  Filled 2022-04-28: qty 7

## 2022-04-28 MED ORDER — HEPARIN SOD (PORK) LOCK FLUSH 100 UNIT/ML IV SOLN
500.0000 [IU] | Freq: Once | INTRAVENOUS | Status: AC | PRN
  Administered 2022-04-28: 500 [IU]
  Filled 2022-04-28: qty 5

## 2022-04-28 NOTE — Patient Instructions (Signed)
[facility] CANCER CTR AT [place]  Discharge Instructions: Thank you for choosing [facility] to provide your oncology and hematology care.  If you have a lab appointment with the [place], please go directly to the [place] and check in at the registration area.  Wear comfortable clothing and clothing appropriate for easy access to any Portacath or PICC line.   We strive to give you quality time with your provider. You may need to reschedule your appointment if you arrive late (15 or more minutes).  Arriving late affects you and other patients whose appointments are after yours.  Also, if you miss three or more appointments without notifying the office, you may be dismissed from the clinic at the provider's discretion.      For prescription refill requests, have your pharmacy contact our office and allow 72 hours for refills to be completed.    Today you received the following chemotherapy and/or immunotherapy agents- etoposide, mesna, ifosphamide      To help prevent nausea and vomiting after your treatment, we encourage you to take your nausea medication as directed.  BELOW ARE SYMPTOMS THAT SHOULD BE REPORTED IMMEDIATELY: *FEVER GREATER THAN 100.4 F (38 C) OR HIGHER *CHILLS OR SWEATING *NAUSEA AND VOMITING THAT IS NOT CONTROLLED WITH YOUR NAUSEA MEDICATION *UNUSUAL SHORTNESS OF BREATH *UNUSUAL BRUISING OR BLEEDING *URINARY PROBLEMS (pain or burning when urinating, or frequent urination) *BOWEL PROBLEMS (unusual diarrhea, constipation, pain near the anus) TENDERNESS IN MOUTH AND THROAT WITH OR WITHOUT PRESENCE OF ULCERS (sore throat, sores in mouth, or a toothache) UNUSUAL RASH, SWELLING OR PAIN  UNUSUAL VAGINAL DISCHARGE OR ITCHING   Items with * indicate a potential emergency and should be followed up as soon as possible or go to the Emergency Department if any problems should occur.  Please show the CHEMOTHERAPY ALERT CARD or IMMUNOTHERAPY ALERT  CARD at check-in to the Emergency Department and triage nurse.  Should you have questions after your visit or need to cancel or reschedule your appointment, please contact [facility] CANCER [place] AT [place]  [phone] and follow the prompts.  Office hours are 8:00 a.m. to 4:30 p.m. Monday - Friday. Please note that voicemails left after 4:00 p.m. may not be returned until the following business day.  We are closed weekends and major holidays. You have access to a nurse at all times for urgent questions. Please call the main number to the clinic [phone] and follow the prompts.  For any non-urgent questions, you may also contact your provider using MyChart. We now offer e-Visits for anyone 34 and older to request care online for non-urgent symptoms. For details visit [email or web address].   Also download the MyChart app! Go to the app store, search "MyChart", open the app, select [place], and log in with your MyChart username and password.

## 2022-05-02 ENCOUNTER — Inpatient Hospital Stay: Payer: BC Managed Care – PPO | Attending: Nurse Practitioner

## 2022-05-02 ENCOUNTER — Ambulatory Visit: Payer: BC Managed Care – PPO

## 2022-05-02 VITALS — BP 106/60 | HR 80 | Temp 98.3°F | Resp 20

## 2022-05-02 DIAGNOSIS — F418 Other specified anxiety disorders: Secondary | ICD-10-CM | POA: Insufficient documentation

## 2022-05-02 DIAGNOSIS — Z5189 Encounter for other specified aftercare: Secondary | ICD-10-CM | POA: Diagnosis not present

## 2022-05-02 DIAGNOSIS — Z79899 Other long term (current) drug therapy: Secondary | ICD-10-CM | POA: Diagnosis not present

## 2022-05-02 DIAGNOSIS — C8468 Anaplastic large cell lymphoma, ALK-positive, lymph nodes of multiple sites: Secondary | ICD-10-CM | POA: Diagnosis not present

## 2022-05-02 DIAGNOSIS — R112 Nausea with vomiting, unspecified: Secondary | ICD-10-CM | POA: Diagnosis not present

## 2022-05-02 DIAGNOSIS — C8464 Anaplastic large cell lymphoma, ALK-positive, lymph nodes of axilla and upper limb: Secondary | ICD-10-CM

## 2022-05-02 DIAGNOSIS — Z5111 Encounter for antineoplastic chemotherapy: Secondary | ICD-10-CM | POA: Insufficient documentation

## 2022-05-02 DIAGNOSIS — T451X5A Adverse effect of antineoplastic and immunosuppressive drugs, initial encounter: Secondary | ICD-10-CM | POA: Insufficient documentation

## 2022-05-02 MED ORDER — HEPARIN SOD (PORK) LOCK FLUSH 100 UNIT/ML IV SOLN
500.0000 [IU] | Freq: Once | INTRAVENOUS | Status: AC
  Administered 2022-05-02: 500 [IU] via INTRAVENOUS
  Filled 2022-05-02: qty 5

## 2022-05-02 MED ORDER — SODIUM CHLORIDE 0.9 % IV SOLN
INTRAVENOUS | Status: DC
  Filled 2022-05-02 (×2): qty 250

## 2022-05-02 MED ORDER — PEGFILGRASTIM-CBQV 6 MG/0.6ML ~~LOC~~ SOSY
6.0000 mg | PREFILLED_SYRINGE | Freq: Once | SUBCUTANEOUS | Status: AC
  Administered 2022-05-02: 6 mg via SUBCUTANEOUS
  Filled 2022-05-02: qty 0.6

## 2022-05-02 MED ORDER — SODIUM CHLORIDE 0.9% FLUSH
10.0000 mL | Freq: Once | INTRAVENOUS | Status: AC
  Administered 2022-05-02: 10 mL via INTRAVENOUS
  Filled 2022-05-02: qty 10

## 2022-05-02 NOTE — Progress Notes (Signed)
Patient for LP Methtrexate Inj on Wed 05/03/2022, I called and spoke with the patient on the phone and gave pre-procedure instructions. Pt was made aware to be here at 8:30a at the new entrance.  Pt stated understanding. Pt asked if she could take Zofran before coming in to have this procedure and Matt the PA said yes.  Called 05/02/2022

## 2022-05-03 ENCOUNTER — Ambulatory Visit
Admission: RE | Admit: 2022-05-03 | Discharge: 2022-05-03 | Disposition: A | Discharge status: Home / Self Care | Payer: BC Managed Care – PPO | Source: Ambulatory Visit | Attending: Oncology | Admitting: Oncology

## 2022-05-03 VITALS — BP 120/68 | HR 90 | Temp 98.7°F | Ht 62.0 in | Wt 155.0 lb

## 2022-05-03 DIAGNOSIS — C8464 Anaplastic large cell lymphoma, ALK-positive, lymph nodes of axilla and upper limb: Secondary | ICD-10-CM | POA: Insufficient documentation

## 2022-05-03 DIAGNOSIS — Z0189 Encounter for other specified special examinations: Secondary | ICD-10-CM | POA: Insufficient documentation

## 2022-05-03 MED ORDER — SODIUM CHLORIDE (PF) 0.9 % IJ SOLN
Freq: Once | INTRAMUSCULAR | Status: AC
  Filled 2022-05-03: qty 0.48

## 2022-05-03 NOTE — Procedures (Signed)
Technically successful fluoro guided LP with methotrexate injection at L2-L3 level.  No labs were ordered by ordering provider.  '12mg'$  methotrexate successfully administered intrathecally.   No immediate post procedural complication.  Please see imaging section of Epic for full dictation.    Reatha Armour, PA-C 05/03/2022, 10:36 AM

## 2022-05-09 ENCOUNTER — Ambulatory Visit
Admission: RE | Admit: 2022-05-09 | Discharge: 2022-05-09 | Disposition: A | Discharge status: Home / Self Care | Payer: BC Managed Care – PPO | Source: Ambulatory Visit | Attending: Oncology | Admitting: Oncology

## 2022-05-09 DIAGNOSIS — C8464 Anaplastic large cell lymphoma, ALK-positive, lymph nodes of axilla and upper limb: Secondary | ICD-10-CM | POA: Diagnosis not present

## 2022-05-09 DIAGNOSIS — C8590 Non-Hodgkin lymphoma, unspecified, unspecified site: Secondary | ICD-10-CM | POA: Diagnosis present

## 2022-05-09 LAB — GLUCOSE, CAPILLARY: Glucose-Capillary: 89 mg/dL (ref 70–99)

## 2022-05-09 MED ORDER — FLUDEOXYGLUCOSE F - 18 (FDG) INJECTION
8.1900 | Freq: Once | INTRAVENOUS | Status: AC | PRN
  Administered 2022-05-09: 8.14 via INTRAVENOUS

## 2022-05-11 ENCOUNTER — Telehealth: Payer: Self-pay

## 2022-05-11 ENCOUNTER — Other Ambulatory Visit: Payer: Self-pay

## 2022-05-11 DIAGNOSIS — C8464 Anaplastic large cell lymphoma, ALK-positive, lymph nodes of axilla and upper limb: Secondary | ICD-10-CM

## 2022-05-11 NOTE — Telephone Encounter (Addendum)
Request for intrathecal methotrexate appt has been faxed to [facility] in IR   To be done on 05/23/22.

## 2022-05-11 NOTE — Telephone Encounter (Signed)
Pt has been scheduled for intrathecal MTX on 1/23 @ 10am, arrive 9am.

## 2022-05-16 ENCOUNTER — Inpatient Hospital Stay: Payer: BC Managed Care – PPO

## 2022-05-16 ENCOUNTER — Encounter: Payer: Self-pay | Admitting: Oncology

## 2022-05-16 ENCOUNTER — Inpatient Hospital Stay (HOSPITAL_BASED_OUTPATIENT_CLINIC_OR_DEPARTMENT_OTHER): Payer: BC Managed Care – PPO | Admitting: Oncology

## 2022-05-16 VITALS — BP 117/69 | HR 93 | Temp 99.0°F | Wt 158.1 lb

## 2022-05-16 DIAGNOSIS — F411 Generalized anxiety disorder: Secondary | ICD-10-CM

## 2022-05-16 DIAGNOSIS — C8464 Anaplastic large cell lymphoma, ALK-positive, lymph nodes of axilla and upper limb: Secondary | ICD-10-CM | POA: Diagnosis not present

## 2022-05-16 DIAGNOSIS — R112 Nausea with vomiting, unspecified: Secondary | ICD-10-CM | POA: Diagnosis not present

## 2022-05-16 DIAGNOSIS — Z5111 Encounter for antineoplastic chemotherapy: Secondary | ICD-10-CM

## 2022-05-16 DIAGNOSIS — C8468 Anaplastic large cell lymphoma, ALK-positive, lymph nodes of multiple sites: Secondary | ICD-10-CM | POA: Diagnosis not present

## 2022-05-16 DIAGNOSIS — T451X5A Adverse effect of antineoplastic and immunosuppressive drugs, initial encounter: Secondary | ICD-10-CM

## 2022-05-16 DIAGNOSIS — C801 Malignant (primary) neoplasm, unspecified: Secondary | ICD-10-CM

## 2022-05-16 LAB — COMPREHENSIVE METABOLIC PANEL
ALT: 20 U/L (ref 0–44)
AST: 21 U/L (ref 15–41)
Albumin: 4.1 g/dL (ref 3.5–5.0)
Alkaline Phosphatase: 56 U/L (ref 38–126)
Anion gap: 7 (ref 5–15)
BUN: 11 mg/dL (ref 6–20)
CO2: 24 mmol/L (ref 22–32)
Calcium: 8.8 mg/dL — ABNORMAL LOW (ref 8.9–10.3)
Chloride: 104 mmol/L (ref 98–111)
Creatinine, Ser: 0.73 mg/dL (ref 0.44–1.00)
GFR, Estimated: >60 mL/min (ref >60)
Glucose, Bld: 95 mg/dL (ref 70–99)
Potassium: 4 mmol/L (ref 3.5–5.1)
Sodium: 135 mmol/L (ref 135–145)
Total Bilirubin: 0.2 mg/dL — ABNORMAL LOW (ref 0.3–1.2)
Total Protein: 7.3 g/dL (ref 6.5–8.1)

## 2022-05-16 LAB — CBC WITH DIFFERENTIAL/PLATELET
Abs Immature Granulocytes: 0.11 10*3/uL — ABNORMAL HIGH (ref 0.00–0.07)
Basophils Absolute: 0 10*3/uL (ref 0.0–0.1)
Basophils Relative: 1 %
Eosinophils Absolute: 0 10*3/uL (ref 0.0–0.5)
Eosinophils Relative: 0 %
HCT: 27.6 % — ABNORMAL LOW (ref 36.0–46.0)
Hemoglobin: 9.1 g/dL — ABNORMAL LOW (ref 12.0–15.0)
Immature Granulocytes: 2 %
Lymphocytes Relative: 21 %
Lymphs Abs: 1.2 10*3/uL (ref 0.7–4.0)
MCH: 31.3 pg (ref 26.0–34.0)
MCHC: 33 g/dL (ref 30.0–36.0)
MCV: 94.8 fL (ref 80.0–100.0)
Monocytes Absolute: 0.5 10*3/uL (ref 0.1–1.0)
Monocytes Relative: 8 %
Neutro Abs: 4 10*3/uL (ref 1.7–7.7)
Neutrophils Relative %: 68 %
Platelets: 137 10*3/uL — ABNORMAL LOW (ref 150–400)
RBC: 2.91 MIL/uL — ABNORMAL LOW (ref 3.87–5.11)
RDW: 17.1 % — ABNORMAL HIGH (ref 11.5–15.5)
WBC: 5.8 10*3/uL (ref 4.0–10.5)
nRBC: 0 % (ref 0.0–0.2)

## 2022-05-16 LAB — URINALYSIS, COMPLETE (UACMP) WITH MICROSCOPIC
Bilirubin Urine: NEGATIVE
Glucose, UA: NEGATIVE mg/dL
Hgb urine dipstick: NEGATIVE
Ketones, ur: NEGATIVE mg/dL
Nitrite: NEGATIVE
Protein, ur: NEGATIVE mg/dL
Specific Gravity, Urine: 1.004 — ABNORMAL LOW (ref 1.005–1.030)
pH: 6 (ref 5.0–8.0)

## 2022-05-16 LAB — PREGNANCY, URINE: Preg Test, Ur: NEGATIVE

## 2022-05-16 MED ORDER — HEPARIN SOD (PORK) LOCK FLUSH 100 UNIT/ML IV SOLN
INTRAVENOUS | Status: AC
  Filled 2022-05-16: qty 5

## 2022-05-16 MED ORDER — HEPARIN SOD (PORK) LOCK FLUSH 100 UNIT/ML IV SOLN
500.0000 [IU] | Freq: Once | INTRAVENOUS | Status: AC
  Administered 2022-05-16: 500 [IU] via INTRAVENOUS
  Filled 2022-05-16: qty 5

## 2022-05-16 MED ORDER — SODIUM CHLORIDE 0.9% FLUSH
10.0000 mL | INTRAVENOUS | Status: AC | PRN
  Administered 2022-05-16: 10 mL via INTRAVENOUS
  Filled 2022-05-16: qty 10

## 2022-05-16 MED ORDER — ALPRAZOLAM 0.5 MG PO TABS: 0.5000 mg | ORAL_TABLET | Freq: Every day | ORAL | 0 refills | Status: DC | PRN

## 2022-05-16 MED FILL — Dexamethasone Sodium Phosphate Inj 100 MG/10ML: INTRAMUSCULAR | Qty: 1 | Status: AC

## 2022-05-16 NOTE — Assessment & Plan Note (Addendum)
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 CSF flowcytometry pending.  Recommend salvage therapy bridging to allogenic transplant Labs are reviewed and discussed with patient. S/p cycle 2  ICE with GCSF support. Proceed with Cycle 3  Day 1 Ifosfamide, Etoposide, Mesna Day 2 Ifosfamide, Etoposide, Carboplatin, Mesna Day 3 Ifosfamide, Etoposide, Mesna Day 7 GCSF + IVF hydration.  Day 8 intrathecal triple therapy with MTX '12mg'$ , Ara C '40mg'$  and hydrocortisone 100 mg Encourage oral hydration.  PET after 2 cycles- de[place] 3  Plan 2 more cycles of consolidation. I will communicated with Dr.Galal for her future BM transplant evaluation.

## 2022-05-16 NOTE — Progress Notes (Signed)
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 CSF flowcytometry pending.  Recommend salvage therapy bridging to allogenic transplant Labs are reviewed and discussed with patient. S/p cycle 2  ICE with GCSF support. Proceed with Cycle 3  Day 1 Ifosfamide, Etoposide, [place] Day 2 Ifosfamide, Etoposide, Carboplatin, [place] Day 3 Ifosfamide, Etoposide, [place] Day 7 GCSF + IVF hydration.  Day 8 intrathecal triple therapy with MTX '12mg'$ , Ara C '40mg'$  and hydrocortisone 100 mg Encourage oral hydration.  PET after 2 cycles- deauville 3  Plan 2 more cycles of consolidation. I will communicated with Dr.Galal for her future BM transplant evaluation.     Chemotherapy induced nausea and vomiting Recommend antimetics, Zofran, Compazine PRN,  as instructed  Encounter for antineoplastic chemotherapy Chemotherapy plan as listed above.   Anxiety associated with cancer diagnosis ([place]) Lorazepam was not helpful. Advise patient to stop and try Xanax.  Side effects were reviewed and patient. Rx sent.   Orders Placed This Encounter  Procedures   Urinalysis, Complete w Microscopic    Standing Status:   Future    Standing Expiration Date:   06/07/2023   CBC with  Differential    Standing Status:   Future    Standing Expiration Date:   06/07/2023   Comprehensive metabolic panel    Standing Status:   Future    Standing Expiration Date:   06/07/2023   Urinalysis, Complete w Microscopic    Standing Status:   Future    Standing Expiration Date:   06/09/2023   CBC with Differential    Standing Status:   Future    Standing Expiration Date:   06/09/2023   Urinalysis, Complete w Microscopic    Standing Status:   Future    Standing Expiration Date:   06/10/2023   CBC with Differential    Standing Status:   Future    Standing Expiration Date:   06/10/2023   Follow-up per LOS   All questions were answered. The patient knows to call the clinic with any problems, questions or concerns.  Earlie Server, MD, PhD [facility] Hematology Oncology 05/16/2022     CHIEF COMPLAINTS/REASON FOR VISIT:  Follow up for anaplastic large cell lymphoma, ALK postive  HISTORY OF PRESENTING ILLNESS:   Angel Tate is a  32 y.o.  female presents for follow-up of ALK positive anaplastic large cell lymphoma Oncology history summary is listed below. 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  Initially the mass did not bother her.  Patient developed a rash in the right axillary/upper outer quadrant of the breast and had additional work-up.  Patient was treated with a 10-day course of doxycycline for possible infection.  Did not improve.  #02/04/2020, targeted ultrasound showed right axillary mass measuring 6.2 x 4 by 4 cm.  There are 2 adjacent smaller axillary mass  measuring 2.0 x 1.2 x 1.4 cm and 0.8 x 0.7 x 0.9 cm.  Mammogram showed dense fibroglandular.  No suspicious mass or malignant type microcalcifications or distortion detected in either breast.  # Patient underwent ultrasound-guided core biopsy of the right axillary mass. 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  Ki-67 80-90%, Flow cytometry has insufficient cells for analysis.    02/17/2020 Bone Marrow Biopsy   Bone marrow biopsy is negative   02/18/2020 Echocardiogram   Echocardiogram showed normal LVEF   03/02/2020 Imaging   PET scan showed right axillary pathological adenopathy, largest lymph node 3.5 cm with maximum SUV 42.8.  An adjacent smaller peripheral right axillary lymph node measuring 1.3 cm with SUV of 23.3. Anterior mediastinal density favors benign thymic tissue maximum SUV 3.0 which is due [place] 4, far below the level of last has lymphadenopathy.   03/03/2020 - 05/30/2020 Chemotherapy   03/03/2020 - 05/30/2020 4 cycles of BV AVD Q21 days.   Patient does not desire to preserve fertility.  She has IUD for contraceptive measures.   05/20/2020 Imaging   PET showed complete response   06/02/2020 - 06/25/2020 Chemotherapy   2 cycles of BV AVD Q21 days   09/01/2020 Imaging   PET scan  1. No signs to suggest residual or recurrent FDG avid tumor.2. Resolution of previously noted generalized marrow hypermetabolism.3. FDG avid brown fat noted within the neck and chest compatible with benign physiologic activity.   09/13/2020 Procedure   Her Mediport was removed by Dr. Lucky Cowboy.   03/01/2021 Imaging   Surveillance PET showed No evidence of active lymphoma.   10/13/2021 Imaging   Surveillance PET showed  1. No evidence of lymphoma recurrence. No lymphadenopathy. Normal spleen and bone marrow.2. Particular attention directed to the RIGHT axilla.3. Benign cyst of the LEFT ovary   03/03/2022 Mammogram   Patient palpated left breast mass with rapid growth.    Bilateral diagnostic mammogram/ left axilla  showed 1. Suspicious 2.4 cm mass along the 11 o'clock axis of the left breast. Recommend ultrasound-guided biopsy. 2. No suspicious left axillary lymphadenopathy. 3. No mammographic evidence of malignancy on the right.   03/06/2022 Relapse/Recurrence   Left breast mass biopsy showed ALK positive anaplastic large cell lymphoma. Ki67 100% Flowcytometry is overall non contributory.    03/15/2022 Imaging   PET restaging showed  1. New hypermetabolic round mass in the medial LEFT breast consistent with lymphoma recurrence. 2. New lesion in the mid body of the pancreas consistent with lymphoma recurrence/metastasis. 3. Very small hypermetabolic RIGHT supraclavicular node is concerning for lymphoma. 4. Two pulmonary nodules in the RIGHT lung are new from prior. Indeterminate nodules.    03/21/2022 Cancer Staging   Staging form: Hodgkin and Non-Hodgkin Lymphoma, AJCC 8th Edition - Clinical stage from 03/21/2022: Stage IV - Signed by Earlie Server, MD on 04/08/2022 Histopathologic type: Anaplastic large cell lymphoma, T cell and Null cell type Stage prefix: Recurrence   03/28/2022 Echocardiogram   2D Echo LVEF 60-65%   03/28/2022 Bone Marrow Biopsy   Bone marrow biopsy showed  Aspirate :  no immunophenotypic evidence of a lymphoproliferative disorder (no monoclonal B cells or immunophenotypically abnormal T cells detected).  Core biopsy showed Hypocellular bone marrow (30%) with otherwise orderly trilineage hematopoiesis. -No morphologic or immunohistochemical evidence of the patient's known  recurrent anaplastic large cell lymphoma.      04/04/2022 -  Chemotherapy   Patient is on Treatment Plan : NON-HODGKINS LYMPHOMA ICE q21d   Intrathecal with MTX '12mg'$ , Ara C '40mg'$  and hydrocortisone 100 mg    04/04/2022 Procedure   Lumbar puncture   CSF flowcytometry  Insufficient B and T cells detected to adequately evaluate for clonality or aberrancy in a  low cellularity specimen,    04/20/2022 Procedure   S/p placement of medi port by Dr.Dew   05/09/2022 Imaging   PET restaging 1. Findings suggest a good response to treatment. The left medial breast lesion is smaller and significantly less hypermetabolic. The right supraclavicular node has resolved and the pancreatic lesion has resolved. SUV < liver  2. No new disease is demonstrated. 3. Diffuse marrow hypermetabolism likely chemotherapy effect or due    Patient presents for evaluation prior to chemotherapy.  She tolerated cycle 2 treatments better.  Manageable nausea symptoms. + Anxiety associated with cancer diagnosis and procedure.  Lorazepam did not help much. Left breast pain has completely resolved.  Review of Systems  Constitutional:  Negative for appetite change, chills, fatigue, fever and unexpected weight change.  HENT:   Negative for hearing loss and voice change.   Eyes:  Negative for eye problems.  Respiratory:  Negative for chest tightness, cough and shortness of breath.   Cardiovascular:  Negative for chest pain.  Gastrointestinal:  Positive for nausea. Negative for abdominal distention, abdominal pain, blood in stool and vomiting.  Endocrine: Negative for hot flashes.  Genitourinary:  Negative for difficulty urinating and frequency.   Musculoskeletal:  Negative for arthralgias.  Skin:  Negative for itching and rash.  Neurological:  Negative for extremity weakness and headaches.  Hematological:  Negative for adenopathy.  Psychiatric/Behavioral:  Negative for confusion. The patient is nervous/anxious.     MEDICAL HISTORY:  Past Medical History:  Diagnosis Date   Anxiety    Depression    Medical history non-contributory     SURGICAL HISTORY: Past Surgical History:  Procedure Laterality Date   BREAST BIOPSY Left 03/06/2022   [place] LT BREAST BX W LOC DEV 1ST LESION IMG BX SPEC US GUIDE 03/06/2022 [facility] MAMMOGRAPHY   DILATION AND CURETTAGE OF UTERUS     DILATION AND  EVACUATION N/A 02/17/2017   Procedure: DILATATION AND EVACUATION;  Surgeon: Janyth Pupa, DO;  Location: [facility];  Service: Gynecology;  Laterality: N/A;   IR FL GUIDED LOC OF NEEDLE/CATH TIP FOR SPINAL INJECTION LT  04/04/2022   IR FLUORO GUIDED NEEDLE PLC ASPIRATION/INJECTION LOC  04/07/2022   PORTA CATH INSERTION N/A 02/23/2020   Procedure: PORTA CATH INSERTION;  Surgeon: Algernon Huxley, MD;  Location: [place] [facility];  Service: Cardiovascular;  Laterality: N/A;   PORTA CATH INSERTION N/A 04/20/2022   Procedure: PORTA CATH INSERTION;  Surgeon: Algernon Huxley, MD;  Location: [place] [facility];  Service: Cardiovascular;  Laterality: N/A;   PORTA CATH REMOVAL N/A 09/13/2020   Procedure: PORTA CATH REMOVAL;  Surgeon: Algernon Huxley, MD;  Location: [place] [facility];  Service: Cardiovascular;  Laterality: N/A;    SOCIAL HISTORY: Social History   Socioeconomic History   Marital status: Married    Spouse name: [name]   Number  of children: 1   Years of education: Not on file   Highest education level: Not on file  Occupational History   Occupation: social media- marketing   Tobacco Use   Smoking status: Never   Smokeless tobacco: Never  Vaping Use   Vaping Use: Never used  Substance and Sexual Activity   Alcohol use: Yes    Comment: socially   Drug use: No   Sexual activity: Yes    Birth control/protection: None  Other Topics Concern   Not on file  Social History Narrative   Not on file   Social Determinants of Health   Financial Resource Strain: Low Risk  (01/16/2019)   Overall Financial Resource Strain (CARDIA)    Difficulty of Paying Living Expenses: Not hard at all  Food Insecurity: No Food Insecurity (01/16/2019)   Hunger Vital Sign    Worried About Running Out of Food in the Last Year: Never true    Ran Out of Food in the Last Year: Never true  Transportation Needs: No Transportation Needs (01/16/2019)   PRAPARE - Hydrologist (Medical): No     Lack of Transportation (Non-Medical): No  Physical Activity: Unknown (01/19/2019)   Exercise Vital Sign    Days of Exercise per Week: Patient refused    Minutes of Exercise per Session: Patient refused  Stress: No Stress Concern Present (01/19/2019)   [place]    Feeling of Stress : Only a little  Social Connections: Unknown (01/19/2019)   Social Connection and Isolation Panel [NHANES]    Frequency of Communication with Friends and Family: Patient refused    Frequency of Social Gatherings with Friends and Family: Patient refused    Attends Religious Services: Patient refused    Active Member of Clubs or Organizations: Patient refused    Attends Archivist Meetings: Patient refused    Marital Status: Patient refused  Intimate Partner Violence: Not At Risk (01/19/2019)   Humiliation, Afraid, Rape, and Kick questionnaire    Fear of Current or Ex-Partner: No    Emotionally Abused: No    Physically Abused: No    Sexually Abused: No    FAMILY HISTORY: Family History  Problem Relation Age of Onset   Asthma Maternal Aunt    Hypertension Maternal Grandmother    Ovarian cancer Paternal Grandmother        dx 76s-80s    ALLERGIES:  has No Known Allergies.  MEDICATIONS:  Current Outpatient Medications  Medication Sig Dispense Refill   acetaminophen (TYLENOL) 500 MG tablet Take 500 mg by mouth every 6 (six) hours as needed.     allopurinol (ZYLOPRIM) 300 MG tablet TAKE 1 TABLET BY MOUTH EVERY DAY 90 tablet 0   ALPRAZolam (XANAX) 0.5 MG tablet Take 1 tablet (0.5 mg total) by mouth daily as needed for anxiety. 7 tablet 0   butalbital-acetaminophen-caffeine (FIORICET) 50-325-40 MG tablet Take 1 tablet by mouth every 6 (six) hours as needed for headache. 20 tablet 0   chlorhexidine (PERIDEX) 0.12 % solution Use as directed 5 mLs in the mouth or throat 2 (two) times daily. 473 mL 0   dexamethasone (DECADRON) 4 MG  tablet Take 2 tablets (8 mg total) by mouth daily. Start the day after chemotherapy for 2 days. Take with food. 30 tablet 1   docusate sodium (COLACE) 100 MG capsule Take 1 capsule (100 mg total) by mouth 2 (two) times daily. 10 capsule 0  lidocaine-prilocaine (EMLA) cream Apply small amount to port-a-cath site approximately 1-2 hours to port being accessed 30 g 3   ondansetron (ZOFRAN) 8 MG tablet Take 1 tablet (8 mg total) by mouth every 8 (eight) hours as needed for nausea or vomiting. Start 2 days after chemotherapy. 30 tablet 1   oxyCODONE (OXY IR/ROXICODONE) 5 MG immediate release tablet Take 1-2 tablets (5-10 mg total) by mouth every 6 (six) hours as needed for severe pain. 90 tablet 0   prochlorperazine (COMPAZINE) 10 MG tablet Take 1 tablet (10 mg total) by mouth every 6 (six) hours as needed for nausea or vomiting. 30 tablet 1   venlafaxine (EFFEXOR) 37.5 MG tablet TAKE 1 TABLET BY MOUTH EVERY DAY 90 tablet 1   escitalopram (LEXAPRO) 10 MG tablet Take 10 mg by mouth daily. (Patient not taking: Reported on 04/25/2022)     escitalopram (LEXAPRO) 5 MG tablet Take 5 mg by mouth daily. (Patient not taking: Reported on 03/21/2022)     loperamide (IMODIUM) 2 MG capsule Take 1 capsule (2 mg total) by mouth See admin instructions. Initial: 4 mg, followed by 2 mg every 2 to 4 hours or after each loose stool; Max 16 mg /24 hours (Patient not taking: Reported on 09/13/2020) 60 capsule 1   naloxone (NARCAN) nasal spray 4 mg/0.1 mL SPRAY 1 SPRAY INTO ONE NOSTRIL AS DIRECTED FOR OPIOID OVERDOSE (TURN PERSON ON SIDE AFTER DOSE. IF NO RESPONSE IN 2-3 MINUTES OR PERSON RESPONDS BUT RELAPSES, REPEAT USING A NEW SPRAY DEVICE AND SPRAY INTO THE OTHER NOSTRIL. CALL 911 AFTER USE.) * EMERGENCY USE ONLY * (Patient not taking: Reported on 04/25/2022) 1 each 0   promethazine (PHENERGAN) 25 MG tablet Take 1 tablet (25 mg total) by mouth every 6 (six) hours as needed for nausea or vomiting. (Patient not taking: Reported on  05/16/2022) 120 tablet 2   No current facility-administered medications for this visit.   Facility-Administered Medications Ordered in Other Visits  Medication Dose Route Frequency Provider Last Rate Last Admin   heparin lock flush 100 UNIT/ML injection            sodium chloride flush (NS) 0.9 % injection 10 mL  10 mL Intravenous PRN Earlie Server, MD   10 mL at 05/16/22 0945     PHYSICAL EXAMINATION: ECOG PERFORMANCE STATUS: 1 - Symptomatic but completely ambulatory Vitals:   05/16/22 1004  BP: 117/69  Pulse: 93  Temp: 99 F (37.2 C)  SpO2: 100%   Filed Weights   05/16/22 1004  Weight: 158 lb 1.6 oz (71.7 kg)     Physical Exam Constitutional:      General: She is not in acute distress. HENT:     Head: Normocephalic and atraumatic.  Eyes:     General: No scleral icterus. Cardiovascular:     Rate and Rhythm: Normal rate and regular rhythm.  Pulmonary:     Effort: Pulmonary effort is normal. No respiratory distress.     Breath sounds: No wheezing.  Abdominal:     General: Bowel sounds are normal. There is no distension.     Palpations: Abdomen is soft.  Musculoskeletal:        General: No deformity. Normal range of motion.     Cervical back: Normal range of motion and neck supple.  Skin:    General: Skin is warm and dry.     Findings: Lesion present.  Neurological:     Mental Status: She is alert and oriented to person, place,  and time. Mental status is at baseline.     Cranial Nerves: No cranial nerve deficit.     Coordination: Coordination normal.  Psychiatric:        Mood and Affect: Mood normal.   04/04/22   LABORATORY DATA:  I have reviewed the data as listed     Latest Ref Rng & Units 05/16/2022    9:38 AM 04/28/2022    8:10 AM 04/27/2022    8:06 AM  CBC  WBC 4.0 - 10.5 K/uL 5.8  5.7  6.6   Hemoglobin 12.0 - 15.0 g/dL 9.1  9.8  10.6   Hematocrit 36.0 - 46.0 % 27.6  29.1  31.2   Platelets 150 - 400 K/uL 137  219  211       Latest Ref Rng & Units  05/16/2022    9:38 AM 04/25/2022    9:14 AM 04/12/2022    8:30 AM  CMP  Glucose 70 - 99 mg/dL 95  80  108   BUN 6 - 20 mg/dL '11  11  11   '$ Creatinine 0.44 - 1.00 mg/dL 0.73  0.63  0.65   Sodium 135 - 145 mmol/L 135  137  136   Potassium 3.5 - 5.1 mmol/L 4.0  4.0  3.9   Chloride 98 - 111 mmol/L 104  106  104   CO2 22 - 32 mmol/L '24  26  24   '$ Calcium 8.9 - 10.3 mg/dL 8.8  8.8  9.1   Total Protein 6.5 - 8.1 g/dL 7.3  7.2  7.8   Total Bilirubin 0.3 - 1.2 mg/dL 0.2  0.5  0.7   Alkaline Phos 38 - 126 U/L 56  61  57   AST 15 - 41 U/L 21  25  32   ALT 0 - 44 U/L 20  20  82     Iron/TIBC/Ferritin/ %Sat No results found for: "IRON", "TIBC", "FERRITIN", "IRONPCTSAT"    RADIOGRAPHIC STUDIES: I have personally reviewed the radiological images as listed and agreed with the findings in the report. NM PET Image Restag (PS) Skull Base To Thigh  Result Date: 05/10/2022 CLINICAL DATA:  Subsequent treatment strategy for anaplastic large-cell lymphoma. EXAM: NUCLEAR MEDICINE PET SKULL BASE TO THIGH TECHNIQUE: 8.14 mCi F-18 FDG was injected intravenously. Full-ring PET imaging was performed from the skull base to thigh after the radiotracer. CT data was obtained and used for attenuation correction and anatomic localization. Fasting blood glucose: 89 mg/dl COMPARISON:  Numerous prior PET CTs. Most recent study is 03/14/2022 FINDINGS: Mediastinal blood pool activity: SUV max 1.43 Liver activity: SUV max 2.72 NECK: No hypermetabolic lymph nodes in the neck. Incidental CT findings: None. CHEST: The medial left breast lesion is much improved. It previously measured 2.9 x 2.9 cm and now measures 2.0 x 1.9 cm. SUV max was 34.34 and is now 2.17. No enlarged or hypermetabolic axillary or supraclavicular nodes. The small right supraclavicular node seen on the prior study has resolved. No mediastinal or hilar lymphadenopathy. No pulmonary lesions or worrisome pulmonary nodules. Small perifissural nodule adjacent to the  minor fissure measures 4 mm and is stable. This is likely benign perifissural lymph node. No hypermetabolism. 5 mm nodule in the right upper lobe on image number 85/2. This previously measured 8 mm. Incidental CT findings: The right IJ Port-A-Cath is in good position. No pleural or pericardial effusion. Stable small hiatal hernia. ABDOMEN/PELVIS: The hypermetabolic lesion in the pancreatic body/tail junction region on the prior PET-CT has  resolved. No hepatic or adrenal gland lesions. No enlarged or hypermetabolic abdominal or pelvic lymph nodes. No inguinal adenopathy. Incidental CT findings: None. SKELETON: Diffuse marrow hypermetabolism consistent with chemotherapy response or marrow stimulating drugs. Incidental CT findings: None. IMPRESSION: 1. Findings suggest a good response to treatment. The left medial breast lesion is smaller and significantly less hypermetabolic. The right supraclavicular node has resolved and the pancreatic lesion has resolved. 2. No new disease is demonstrated. 3. Diffuse marrow hypermetabolism likely chemotherapy effect or due to marrow stimulating drugs. Electronically Signed   By: Marijo Sanes M.D.   On: 05/10/2022 10:59   DG FLUORO GUIDED LOC OF NEEDLE/CATH TIP FOR SPINAL INJECT LT  Result Date: 05/03/2022 CLINICAL DATA:  Anaplastic ALK positive large cell lymphoma of lymph nodes of axilla. Request for intrathecal administration of methotrexate, cytarabine, and hydrocortisone. EXAM: FLUOROSCOPICALLY GUIDED LUMBAR PUNCTURE FOR INTRATHECAL CHEMOTHERAPY TECHNIQUE: Informed consent was obtained from the patient prior to the procedure, including potential complications of headache, allergy, and pain. A 'time out' was performed. With the patient prone, the lower back was prepped with Betadine. 1% Lidocaine was used for local anesthesia. Lumbar puncture was performed at the L2-L3 level using a 20 gauge needle with return of clear CSF. 12 mg of methotrexate was injected into the  subarachnoid space. The patient tolerated the procedure well without apparent complication. FLUOROSCOPY: 2.90 mGy Kerma IMPRESSION: Intrathecal injection of chemotherapy at L2-L3 level without complication Electronically Signed   By: Kathreen Devoid M.D.   On: 05/03/2022 11:38   PERIPHERAL VASCULAR CATHETERIZATION  Result Date: 04/20/2022 See surgical note for result.

## 2022-05-16 NOTE — Assessment & Plan Note (Signed)
Lorazepam was not helpful. Advise patient to stop and try Xanax.  Side effects were reviewed and patient. Rx sent.

## 2022-05-16 NOTE — Assessment & Plan Note (Signed)
Recommend antimetics, Zofran, Compazine PRN,  as instructed

## 2022-05-16 NOTE — Assessment & Plan Note (Signed)
Chemotherapy plan as listed above 

## 2022-05-17 ENCOUNTER — Telehealth: Payer: Self-pay

## 2022-05-17 ENCOUNTER — Other Ambulatory Visit: Payer: Self-pay

## 2022-05-17 ENCOUNTER — Inpatient Hospital Stay: Payer: BC Managed Care – PPO

## 2022-05-17 VITALS — BP 119/63 | HR 94 | Temp 98.2°F | Resp 18

## 2022-05-17 DIAGNOSIS — C8464 Anaplastic large cell lymphoma, ALK-positive, lymph nodes of axilla and upper limb: Secondary | ICD-10-CM

## 2022-05-17 DIAGNOSIS — C8468 Anaplastic large cell lymphoma, ALK-positive, lymph nodes of multiple sites: Secondary | ICD-10-CM | POA: Diagnosis not present

## 2022-05-17 MED ORDER — PALONOSETRON HCL INJECTION 0.25 MG/5ML
0.2500 mg | Freq: Once | INTRAVENOUS | Status: AC
  Administered 2022-05-17: 0.25 mg via INTRAVENOUS
  Filled 2022-05-17: qty 5

## 2022-05-17 MED ORDER — SODIUM CHLORIDE 0.9 % IV SOLN
Freq: Once | INTRAVENOUS | Status: AC
  Filled 2022-05-17: qty 54

## 2022-05-17 MED ORDER — SODIUM CHLORIDE 0.9% FLUSH
10.0000 mL | INTRAVENOUS | Status: DC | PRN
  Administered 2022-05-17: 10 mL
  Filled 2022-05-17: qty 10

## 2022-05-17 MED ORDER — LORAZEPAM 2 MG/ML IJ SOLN
0.5000 mg | Freq: Once | INTRAMUSCULAR | Status: AC | PRN
  Administered 2022-05-17: 0.5 mg via INTRAVENOUS
  Filled 2022-05-17: qty 1

## 2022-05-17 MED ORDER — SODIUM CHLORIDE 0.9 % IV SOLN
10.0000 mg | Freq: Once | INTRAVENOUS | Status: AC
  Administered 2022-05-17: 10 mg via INTRAVENOUS
  Filled 2022-05-17: qty 10

## 2022-05-17 MED ORDER — HEPARIN SOD (PORK) LOCK FLUSH 100 UNIT/ML IV SOLN
500.0000 [IU] | Freq: Once | INTRAVENOUS | Status: AC | PRN
  Administered 2022-05-17: 500 [IU]
  Filled 2022-05-17: qty 5

## 2022-05-17 MED ORDER — SODIUM CHLORIDE 0.9 % IV SOLN
100.0000 mg/m2 | Freq: Once | INTRAVENOUS | Status: AC
  Administered 2022-05-17: 180 mg via INTRAVENOUS
  Filled 2022-05-17: qty 9

## 2022-05-17 MED ORDER — SODIUM CHLORIDE 0.9 % IV SOLN
400.0000 mg/m2 | Freq: Once | INTRAVENOUS | Status: AC
  Administered 2022-05-17: 700 mg via INTRAVENOUS
  Filled 2022-05-17: qty 7

## 2022-05-17 MED ORDER — SODIUM CHLORIDE 0.9 % IV SOLN
Freq: Once | INTRAVENOUS | Status: AC
  Filled 2022-05-17: qty 250

## 2022-05-17 NOTE — Telephone Encounter (Signed)
Request for intrathecal MTX faxed to [facility] in [place]. Requested date: 2/13

## 2022-05-17 NOTE — Patient Instructions (Signed)
[facility] AT [place]  Discharge Instructions: Thank you for choosing [facility] to provide your oncology and hematology care.  If you have a lab appointment with the [facility], please go directly to the [facility] and check in at the registration area.  Wear comfortable clothing and clothing appropriate for easy access to any Portacath or PICC line.   We strive to give you quality time with your provider. You may need to reschedule your appointment if you arrive late (15 or more minutes).  Arriving late affects you and other patients whose appointments are after yours.  Also, if you miss three or more appointments without notifying the office, you may be dismissed from the clinic at the provider's discretion.      For prescription refill requests, have your pharmacy contact our office and allow 72 hours for refills to be completed.    Today you received the following chemotherapy and/or immunotherapy agents ETOPOSIDE, MESNA, IFEX      To help prevent nausea and vomiting after your treatment, we encourage you to take your nausea medication as directed.  BELOW ARE SYMPTOMS THAT SHOULD BE REPORTED IMMEDIATELY: *FEVER GREATER THAN 100.4 F (38 C) OR HIGHER *CHILLS OR SWEATING *NAUSEA AND VOMITING THAT IS NOT CONTROLLED WITH YOUR NAUSEA MEDICATION *UNUSUAL SHORTNESS OF BREATH *UNUSUAL BRUISING OR BLEEDING *URINARY PROBLEMS (pain or burning when urinating, or frequent urination) *BOWEL PROBLEMS (unusual diarrhea, constipation, pain near the anus) TENDERNESS IN MOUTH AND THROAT WITH OR WITHOUT PRESENCE OF ULCERS (sore throat, sores in mouth, or a toothache) UNUSUAL RASH, SWELLING OR PAIN  UNUSUAL VAGINAL DISCHARGE OR ITCHING   Items with * indicate a potential emergency and should be followed up as soon as possible or go to the Emergency Department if any problems should occur.  Please show the CHEMOTHERAPY ALERT CARD or IMMUNOTHERAPY ALERT CARD at  check-in to the Emergency Department and triage nurse.  Should you have questions after your visit or need to cancel or reschedule your appointment, please contact [facility] [place] AT [place]  [phone] and follow the prompts.  Office hours are 8:00 a.m. to 4:30 p.m. Monday - Friday. Please note that voicemails left after 4:00 p.m. may not be returned until the following business day.  We are closed weekends and major holidays. You have access to a nurse at all times for urgent questions. Please call the main number to the clinic [phone] and follow the prompts.  For any non-urgent questions, you may also contact your provider using MyChart. We now offer e-Visits for anyone 98 and older to request care online for non-urgent symptoms. For details visit [email or web address].   Also download the MyChart app! Go to the app store, search "MyChart", open the app, select [place], and log in with your MyChart username and password.  Etoposide Injection What is this medication? ETOPOSIDE (e toe POE side) treats some types of cancer. It works by slowing down the growth of cancer cells. This medicine may be used for other purposes; ask your health care provider or pharmacist if you have questions. COMMON BRAND NAME(S): Etopophos, Toposar, VePesid What should I tell my care team before I take this medication? They need to know if you have any of these conditions: Infection Kidney disease Liver disease Low blood counts, such as low white cell, platelet, red cell counts An unusual or allergic reaction to etoposide, other medications, foods, dyes, or preservatives If you or your partner are pregnant or trying to  get pregnant Breastfeeding How should I use this medication? This medication is injected into a vein. It is given by your care team in a hospital or clinic setting. Talk to your care team about the use of this medication in children. Special care may be  needed. Overdosage: If you think you have taken too much of this medicine contact a poison control center or emergency room at once. NOTE: This medicine is only for you. Do not share this medicine with others. What if I miss a dose? Keep appointments for follow-up doses. It is important not to miss your dose. Call your care team if you are unable to keep an appointment. What may interact with this medication? Warfarin This list may not describe all possible interactions. Give your health care provider a list of all the medicines, herbs, non-prescription drugs, or dietary supplements you use. Also tell them if you smoke, drink alcohol, or use illegal drugs. Some items may interact with your medicine. What should I watch for while using this medication? Your condition will be monitored carefully while you are receiving this medication. This medication may make you feel generally unwell. This is not uncommon as chemotherapy can affect healthy cells as well as cancer cells. Report any side effects. Continue your course of treatment even though you feel ill unless your care team tells you to stop. This medication can cause serious side effects. To reduce the risk, your care team may give you other medications to take before receiving this one. Be sure to follow the directions from your care team. This medication may increase your risk of getting an infection. Call your care team for advice if you get a fever, chills, sore throat, or other symptoms of a cold or flu. Do not treat yourself. Try to avoid being around people who are sick. This medication may increase your risk to bruise or bleed. Call your care team if you notice any unusual bleeding. Talk to your care team about your risk of cancer. You may be more at risk for certain types of cancers if you take this medication. Talk to your care team if you may be pregnant. Serious birth defects can occur if you take this medication during pregnancy and for  6 months after the last dose. You will need a negative pregnancy test before starting this medication. Contraception is recommended while taking this medication and for 6 months after the last dose. Your care team can help you find the option that works for you. If your partner can get pregnant, use a condom during sex while taking this medication and for 4 months after the last dose. Do not breastfeed while taking this medication. This medication may cause infertility. Talk to your care team if you are concerned about your fertility. What side effects may I notice from receiving this medication? Side effects that you should report to your care team as soon as possible: Allergic reactions--skin rash, itching, hives, swelling of the face, lips, tongue, or throat Infection--fever, chills, cough, sore throat, wounds that don't heal, pain or trouble when passing urine, general feeling of discomfort or being unwell Low red blood cell level--unusual weakness or fatigue, dizziness, headache, trouble breathing Unusual bruising or bleeding Side effects that usually do not require medical attention (report to your care team if they continue or are bothersome): Diarrhea Fatigue Hair loss Loss of appetite Nausea Vomiting This list may not describe all possible side effects. Call your doctor for medical advice about side effects. You  may report side effects to FDA at 1-800-FDA-1088. Where should I keep my medication? This medication is given in a hospital or clinic. It will not be stored at home. NOTE: This sheet is a summary. It may not cover all possible information. If you have questions about this medicine, talk to your doctor, pharmacist, or health care provider.  2023 Elsevier/Gold Standard (2007-06-08 00:00:00)   Mesna Injection What is this medication? MESNA (MES na) reduces the risk of bleeding in the bladder caused by ifosfamide, a type of chemotherapy. It works by protecting your bladder  from substances in the urine that may irritate it. This medicine may be used for other purposes; ask your health care provider or pharmacist if you have questions. COMMON BRAND NAME(S): Mesnex What should I tell my care team before I take this medication? They need to know if you have any of these conditions: An unusual or allergic reaction to mesna, other medications, foods, dyes, or preservatives Pregnant or trying to get pregnant Breast-feeding How should I use this medication? This medication is injected into a vein. It is given by your care team in a hospital or clinic setting. Talk to your care team about the use of this medication in children. Special care may be needed. Overdosage: If you think you have taken too much of this medicine contact a poison control center or emergency room at once. NOTE: This medicine is only for you. Do not share this medicine with others. What if I miss a dose? Keep appointments for follow-up doses. It is important not to miss your dose. Call your care team if you are unable to keep an appointment. What may interact with this medication? Interactions are not expected. This list may not describe all possible interactions. Give your health care provider a list of all the medicines, herbs, non-prescription drugs, or dietary supplements you use. Also tell them if you smoke, drink alcohol, or use illegal drugs. Some items may interact with your medicine. What should I watch for while using this medication? Your condition will be monitored carefully while you are receiving this medication. Tell your care team right away if you see that your urine has turned a pink or red color. It is important to drink at least a quart (4 cups) of fluids each day that you take this medication. This medication may cause serious skin reactions. They can happen weeks to months after starting the medication. Contact your care team right away if you notice fevers or flu-like symptoms  with a rash. The rash may be red or purple and then turn into blisters or peeling of the skin. You may also notice a red rash with swelling of the face, lips, or lymph nodes in your neck or under your arms. Talk to your care team if you or your partner may be pregnant. Serious birth defects can occur if you take this medication during pregnancy and for 6 months after the last dose. You will need a negative pregnancy test before starting this medication. Contraception is recommended while taking this medication and for 6 months after the last dose. Your care team can help you find the option that works for you. If your partner can get pregnant, use a condom during sex while taking this medication and for 3 months after the last dose. Do not breastfeed while taking this medication and for 1 week after the last dose. What side effects may I notice from receiving this medication? Side effects that you should report  to your care team as soon as possible: Allergic reactions or angioedema--skin rash, itching or hives, swelling of the face, eyes, lips, tongue, arms, or legs, trouble swallowing or breathing Redness, blistering, peeling, or loosening of the skin, including inside the mouth Side effects that usually do not require medical attention (report to your care team if they continue or are bothersome): Constipation Diarrhea Fatigue Hair loss Headache Nausea This list may not describe all possible side effects. Call your doctor for medical advice about side effects. You may report side effects to FDA at 1-800-FDA-1088. Where should I keep my medication? This medication is given in a hospital or clinic. It will not be stored at home. NOTE: This sheet is a summary. It may not cover all possible information. If you have questions about this medicine, talk to your doctor, pharmacist, or health care provider.  2023 Elsevier/Gold Standard (2021-08-26 00:00:00)  Ifosfamide Injection What is this  medication? IFOSFAMIDE (eye FOS fa mide) treats some types of cancer. It works by slowing down the growth of cancer cells. This medicine may be used for other purposes; ask your health care provider or pharmacist if you have questions. COMMON BRAND NAME(S): Ifex, Ifex/Mesna What should I tell my care team before I take this medication? They need to know if you have any of these conditions: Heart disease History of irregular heartbeat Immune system problems Infection, such as chickenpox, cold sores, or herpes Infections in the bladder, kidneys, or urinary tract Kidney disease Low blood cell levels, such as white cells, platelets, or red blood cells Lung or breathing disease, such as asthma or COPD Problems urinating Recent or ongoing radiation therapy Tingling of the fingers or toes or other nerve disorder An unusual or allergic reaction to ifosfamide, other medications, foods, dyes, or preservatives Pregnant or trying to get pregnant Breast-feeding How should I use this medication? This medication is injected into a vein. It is given by your care team in a hospital or clinic setting. Talk to your care team about the use of this medication in children. Special care may be needed. Overdosage: If you think you have taken too much of this medicine contact a poison control center or emergency room at once. NOTE: This medicine is only for you. Do not share this medicine with others. What if I miss a dose? Keep appointments for follow-up doses. It is important not to miss your dose. Call your care team if you are unable to keep an appointment. What may interact with this medication? Do not take this medication with any of the following: Live virus vaccines This medication may interact with the following: Aprepitant, fosaprepitant Certain medications for fungal infections, such as fluconazole, itraconazole, and ketoconazole Certain medications for seizures, such as carbamazepine,  phenobarbital, phenytoin Grapefruit, grapefruit juice Rifampin St. John's wort This list may not describe all possible interactions. Give your health care provider a list of all the medicines, herbs, non-prescription drugs, or dietary supplements you use. Also tell them if you smoke, drink alcohol, or use illegal drugs. Some items may interact with your medicine. What should I watch for while using this medication? Visit your care team for checks on your progress. This medication may make you feel generally unwell. This is not uncommon, as chemotherapy can affect healthy cells as well as cancer cells. Report any side effects. Continue your course of treatment even though you feel ill unless your care team tells you to stop. Drink water or other fluids as directed. Urinate often, even  at night. In some cases, you may be given additional medications to help with side effects. Follow all directions for their use. This medication may increase your risk of getting an infection. Call your care team for advice if you get a fever, chills, sore throat, or other symptoms of a cold or flu. Do not treat yourself. Try to avoid being around people who are sick. This medication may increase your risk to bruise or bleed. Call your care team if you notice any unusual bleeding. Be careful brushing and flossing your teeth or using a toothpick because you may get an infection or bleed more easily. If you have any dental work done, tell your dentist you are receiving this medication. Avoid taking medications that contain aspirin, acetaminophen, ibuprofen, naproxen, or ketoprofen unless instructed by your care team. These medications may hide a fever. Talk to your care team if you or your partner wish to become pregnant. This medication can cause serious birth defects if taken during pregnancy and for 6 months after stopping treatment. Do not father a child while taking this medication and for 6 months after stopping  treatment. A reliable form of contraception is recommended while taking this medication and for 6 months after stopping treatment. Talk to your care team about reliable forms of contraception. Do not breast-feed while taking this medication. This medication may cause infertility. Talk to your care team if you are concerned about your fertility. What side effects may I notice from receiving this medication? Side effects that you should report to your care team as soon as possible: Allergic reactions--skin rash, itching, hives, swelling of the face, lips, tongue, or throat Dry cough, shortness of breath or trouble breathing Hallucinations Heart failure--shortness of breath, swelling of the ankles, feet, or hands, sudden weight gain, unusual weakness or fatigue Heart rhythm changes--fast or irregular heartbeat, dizziness, feeling faint or lightheaded, chest pain, trouble breathing Infection--fever, chills, cough, sore throat, wounds that don't heal, pain or trouble when passing urine, general feeling of discomfort or being unwell Kidney injury--decrease in the amount of urine, swelling of the ankles, hands, or feet Liver injury--right upper belly pain, loss of appetite, nausea, light-colored stool, dark yellow or brown urine, yellowing skin or eyes, unusual weakness or fatigue Low red blood cell level--unusual weakness or fatigue, dizziness, headache, trouble breathing Pain, tingling, or numbness in the hands or feet, muscle weakness, change in vision, confusion or trouble speaking, loss of balance or coordination, trouble walking, seizures Red or dark brown urine Unusual bruising or bleeding Unusual changes in behavior Side effects that usually do not require medical attention (report to your care team if they continue or are bothersome): Hair loss Nausea Vomiting This list may not describe all possible side effects. Call your doctor for medical advice about side effects. You may report side  effects to FDA at 1-800-FDA-1088. Where should I keep my medication? This medication is given in a hospital or clinic. It will not be stored at home. NOTE: This sheet is a summary. It may not cover all possible information. If you have questions about this medicine, talk to your doctor, pharmacist, or health care provider.  2023 Elsevier/Gold Standard (2021-07-21 00:00:00)

## 2022-05-18 ENCOUNTER — Inpatient Hospital Stay: Payer: BC Managed Care – PPO

## 2022-05-18 ENCOUNTER — Encounter: Payer: Self-pay | Admitting: Oncology

## 2022-05-18 VITALS — BP 107/54 | HR 88 | Temp 97.3°F | Resp 16

## 2022-05-18 DIAGNOSIS — C8464 Anaplastic large cell lymphoma, ALK-positive, lymph nodes of axilla and upper limb: Secondary | ICD-10-CM

## 2022-05-18 DIAGNOSIS — C8468 Anaplastic large cell lymphoma, ALK-positive, lymph nodes of multiple sites: Secondary | ICD-10-CM | POA: Diagnosis not present

## 2022-05-18 LAB — CBC WITH DIFFERENTIAL/PLATELET
Abs Immature Granulocytes: 0.1 10*3/uL — ABNORMAL HIGH (ref 0.00–0.07)
Basophils Absolute: 0 10*3/uL (ref 0.0–0.1)
Basophils Relative: 0 %
Eosinophils Absolute: 0 10*3/uL (ref 0.0–0.5)
Eosinophils Relative: 0 %
HCT: 27.2 % — ABNORMAL LOW (ref 36.0–46.0)
Hemoglobin: 9.1 g/dL — ABNORMAL LOW (ref 12.0–15.0)
Immature Granulocytes: 1 %
Lymphocytes Relative: 16 %
Lymphs Abs: 1.4 10*3/uL (ref 0.7–4.0)
MCH: 31.6 pg (ref 26.0–34.0)
MCHC: 33.5 g/dL (ref 30.0–36.0)
MCV: 94.4 fL (ref 80.0–100.0)
Monocytes Absolute: 0.8 10*3/uL (ref 0.1–1.0)
Monocytes Relative: 10 %
Neutro Abs: 6.3 10*3/uL (ref 1.7–7.7)
Neutrophils Relative %: 73 %
Platelets: 242 10*3/uL (ref 150–400)
RBC: 2.88 MIL/uL — ABNORMAL LOW (ref 3.87–5.11)
RDW: 17.6 % — ABNORMAL HIGH (ref 11.5–15.5)
WBC: 8.6 10*3/uL (ref 4.0–10.5)
nRBC: 0 % (ref 0.0–0.2)

## 2022-05-18 LAB — URINALYSIS, COMPLETE (UACMP) WITH MICROSCOPIC
Bilirubin Urine: NEGATIVE
Glucose, UA: 50 mg/dL — AB
Ketones, ur: NEGATIVE mg/dL
Leukocytes,Ua: NEGATIVE
Nitrite: NEGATIVE
Protein, ur: 100 mg/dL — AB
Specific Gravity, Urine: 1.011 (ref 1.005–1.030)
pH: 6 (ref 5.0–8.0)

## 2022-05-18 MED ORDER — SODIUM CHLORIDE 0.9 % IV SOLN
400.0000 mg/m2 | Freq: Once | INTRAVENOUS | Status: AC
  Administered 2022-05-18: 700 mg via INTRAVENOUS
  Filled 2022-05-18: qty 7

## 2022-05-18 MED ORDER — SODIUM CHLORIDE 0.9 % IV SOLN
10.0000 mg | Freq: Once | INTRAVENOUS | Status: AC
  Administered 2022-05-18: 10 mg via INTRAVENOUS
  Filled 2022-05-18: qty 10

## 2022-05-18 MED ORDER — SODIUM CHLORIDE 0.9 % IV SOLN
100.0000 mg/m2 | Freq: Once | INTRAVENOUS | Status: AC
  Administered 2022-05-18: 180 mg via INTRAVENOUS
  Filled 2022-05-18: qty 9

## 2022-05-18 MED ORDER — SODIUM CHLORIDE 0.9 % IV SOLN
Freq: Once | INTRAVENOUS | Status: DC
  Filled 2022-05-18: qty 250

## 2022-05-18 MED ORDER — SODIUM CHLORIDE 0.9 % IV SOLN
150.0000 mg | Freq: Once | INTRAVENOUS | Status: AC
  Administered 2022-05-18: 150 mg via INTRAVENOUS
  Filled 2022-05-18: qty 150

## 2022-05-18 MED ORDER — SODIUM CHLORIDE 0.9% FLUSH
10.0000 mL | INTRAVENOUS | Status: DC | PRN
  Filled 2022-05-18: qty 10

## 2022-05-18 MED ORDER — HEPARIN SOD (PORK) LOCK FLUSH 100 UNIT/ML IV SOLN
500.0000 [IU] | Freq: Once | INTRAVENOUS | Status: DC | PRN
  Filled 2022-05-18: qty 5

## 2022-05-18 MED ORDER — LORAZEPAM 2 MG/ML IJ SOLN
0.5000 mg | Freq: Once | INTRAMUSCULAR | Status: AC | PRN
  Administered 2022-05-18: 0.5 mg via INTRAVENOUS
  Filled 2022-05-18: qty 1

## 2022-05-18 MED ORDER — SODIUM CHLORIDE 0.9 % IV SOLN
Freq: Once | INTRAVENOUS | Status: DC
  Filled 2022-05-18: qty 54

## 2022-05-18 MED ORDER — SODIUM CHLORIDE 0.9 % IV SOLN
724.0000 mg | Freq: Once | INTRAVENOUS | Status: AC
  Administered 2022-05-18: 700 mg via INTRAVENOUS
  Filled 2022-05-18: qty 70

## 2022-05-18 MED ORDER — SODIUM CHLORIDE 0.9 % IV SOLN
Freq: Once | INTRAVENOUS | Status: AC
  Filled 2022-05-18: qty 250

## 2022-05-18 NOTE — Patient Instructions (Addendum)
[facility]CER CTR AT [place]  Discharge Instructions: Thank you for choosing [facility] to provide your oncology and hematology care.  If you have a lab appointment with the [facility], please go directly to the [facility] and check in at the registration area.  Wear comfortable clothing and clothing appropriate for easy access to any Portacath or PICC line.   We strive to give you quality time with your provider. You may need to reschedule your appointment if you arrive late (15 or more minutes).  Arriving late affects you and other patients whose appointments are after yours.  Also, if you miss three or more appointments without notifying the office, you may be dismissed from the clinic at the provider's discretion.      For prescription refill requests, have your pharmacy contact our office and allow 72 hours for refills to be completed.    Today you received the following chemotherapy and/or immunotherapy agents ETOPOSIDE, MESNA, IFEX, CARBOPLATIN    To help prevent nausea and vomiting after your treatment, we encourage you to take your nausea medication as directed.  BELOW ARE SYMPTOMS THAT SHOULD BE REPORTED IMMEDIATELY: *FEVER GREATER THAN 100.4 F (38 C) OR HIGHER *CHILLS OR SWEATING *NAUSEA AND VOMITING THAT IS NOT CONTROLLED WITH YOUR NAUSEA MEDICATION *UNUSUAL SHORTNESS OF BREATH *UNUSUAL BRUISING OR BLEEDING *URINARY PROBLEMS (pain or burning when urinating, or frequent urination) *BOWEL PROBLEMS (unusual diarrhea, constipation, pain near the anus) TENDERNESS IN MOUTH AND THROAT WITH OR WITHOUT PRESENCE OF ULCERS (sore throat, sores in mouth, or a toothache) UNUSUAL RASH, SWELLING OR PAIN  UNUSUAL VAGINAL DISCHARGE OR ITCHING   Items with * indicate a potential emergency and should be followed up as soon as possible or go to the Emergency Department if any problems should occur.  Please show the CHEMOTHERAPY ALERT CARD or IMMUNOTHERAPY  ALERT CARD at check-in to the Emergency Department and triage nurse.  Should you have questions after your visit or need to cancel or reschedule your appointment, please contact [facility] CANCER [place] AT [place]  [phone] and follow the prompts.  Office hours are 8:00 a.m. to 4:30 p.m. Monday - Friday. Please note that voicemails left after 4:00 p.m. may not be returned until the following business day.  We are closed weekends and major holidays. You have access to a nurse at all times for urgent questions. Please call the main number to the clinic [phone] and follow the prompts.  For any non-urgent questions, you may also contact your provider using MyChart. We now offer e-Visits for anyone 25 and older to request care online for non-urgent symptoms. For details visit [email or web address].   Also download the MyChart app! Go to the app store, search "MyChart", open the app, select [facility], and log in with your MyChart username and password.  Etoposide Injection What is this medication? ETOPOSIDE (e toe POE side) treats some types of cancer. It works by slowing down the growth of cancer cells. This medicine may be used for other purposes; ask your health care provider or pharmacist if you have questions. COMMON BRAND NAME(S): Etopophos, Toposar, VePesid What should I tell my care team before I take this medication? They need to know if you have any of these conditions: Infection Kidney disease Liver disease Low blood counts, such as low white cell, platelet, red cell counts An unusual or allergic reaction to etoposide, other medications, foods, dyes, or preservatives If you or your partner are pregnant or trying to get  pregnant Breastfeeding How should I use this medication? This medication is injected into a vein. It is given by your care team in a hospital or clinic setting. Talk to your care team about the use of this medication in children. Special care may be  needed. Overdosage: If you think you have taken too much of this medicine contact a poison control center or emergency room at once. NOTE: This medicine is only for you. Do not share this medicine with others. What if I miss a dose? Keep appointments for follow-up doses. It is important not to miss your dose. Call your care team if you are unable to keep an appointment. What may interact with this medication? Warfarin This list may not describe all possible interactions. Give your health care provider a list of all the medicines, herbs, non-prescription drugs, or dietary supplements you use. Also tell them if you smoke, drink alcohol, or use illegal drugs. Some items may interact with your medicine. What should I watch for while using this medication? Your condition will be monitored carefully while you are receiving this medication. This medication may make you feel generally unwell. This is not uncommon as chemotherapy can affect healthy cells as well as cancer cells. Report any side effects. Continue your course of treatment even though you feel ill unless your care team tells you to stop. This medication can cause serious side effects. To reduce the risk, your care team may give you other medications to take before receiving this one. Be sure to follow the directions from your care team. This medication may increase your risk of getting an infection. Call your care team for advice if you get a fever, chills, sore throat, or other symptoms of a cold or flu. Do not treat yourself. Try to avoid being around people who are sick. This medication may increase your risk to bruise or bleed. Call your care team if you notice any unusual bleeding. Talk to your care team about your risk of cancer. You may be more at risk for certain types of cancers if you take this medication. Talk to your care team if you may be pregnant. Serious birth defects can occur if you take this medication during pregnancy and for  6 months after the last dose. You will need a negative pregnancy test before starting this medication. Contraception is recommended while taking this medication and for 6 months after the last dose. Your care team can help you find the option that works for you. If your partner can get pregnant, use a condom during sex while taking this medication and for 4 months after the last dose. Do not breastfeed while taking this medication. This medication may cause infertility. Talk to your care team if you are concerned about your fertility. What side effects may I notice from receiving this medication? Side effects that you should report to your care team as soon as possible: Allergic reactions--skin rash, itching, hives, swelling of the face, lips, tongue, or throat Infection--fever, chills, cough, sore throat, wounds that don't heal, pain or trouble when passing urine, general feeling of discomfort or being unwell Low red blood cell level--unusual weakness or fatigue, dizziness, headache, trouble breathing Unusual bruising or bleeding Side effects that usually do not require medical attention (report to your care team if they continue or are bothersome): Diarrhea Fatigue Hair loss Loss of appetite Nausea Vomiting This list may not describe all possible side effects. Call your doctor for medical advice about side effects. You may  report side effects to FDA at 1-800-FDA-1088. Where should I keep my medication? This medication is given in a hospital or clinic. It will not be stored at home. NOTE: This sheet is a summary. It may not cover all possible information. If you have questions about this medicine, talk to your doctor, pharmacist, or health care provider.  2023 Elsevier/Gold Standard (2007-06-08 00:00:00)   Mesna Injection What is this medication? MESNA (MES na) reduces the risk of bleeding in the bladder caused by ifosfamide, a type of chemotherapy. It works by protecting your bladder  from substances in the urine that may irritate it. This medicine may be used for other purposes; ask your health care provider or pharmacist if you have questions. COMMON BRAND NAME(S): Mesnex What should I tell my care team before I take this medication? They need to know if you have any of these conditions: An unusual or allergic reaction to mesna, other medications, foods, dyes, or preservatives Pregnant or trying to get pregnant Breast-feeding How should I use this medication? This medication is injected into a vein. It is given by your care team in a hospital or clinic setting. Talk to your care team about the use of this medication in children. Special care may be needed. Overdosage: If you think you have taken too much of this medicine contact a poison control center or emergency room at once. NOTE: This medicine is only for you. Do not share this medicine with others. What if I miss a dose? Keep appointments for follow-up doses. It is important not to miss your dose. Call your care team if you are unable to keep an appointment. What may interact with this medication? Interactions are not expected. This list may not describe all possible interactions. Give your health care provider a list of all the medicines, herbs, non-prescription drugs, or dietary supplements you use. Also tell them if you smoke, drink alcohol, or use illegal drugs. Some items may interact with your medicine. What should I watch for while using this medication? Your condition will be monitored carefully while you are receiving this medication. Tell your care team right away if you see that your urine has turned a pink or red color. It is important to drink at least a quart (4 cups) of fluids each day that you take this medication. This medication may cause serious skin reactions. They can happen weeks to months after starting the medication. Contact your care team right away if you notice fevers or flu-like symptoms  with a rash. The rash may be red or purple and then turn into blisters or peeling of the skin. You may also notice a red rash with swelling of the face, lips, or lymph nodes in your neck or under your arms. Talk to your care team if you or your partner may be pregnant. Serious birth defects can occur if you take this medication during pregnancy and for 6 months after the last dose. You will need a negative pregnancy test before starting this medication. Contraception is recommended while taking this medication and for 6 months after the last dose. Your care team can help you find the option that works for you. If your partner can get pregnant, use a condom during sex while taking this medication and for 3 months after the last dose. Do not breastfeed while taking this medication and for 1 week after the last dose. What side effects may I notice from receiving this medication? Side effects that you should report to  your care team as soon as possible: Allergic reactions or angioedema--skin rash, itching or hives, swelling of the face, eyes, lips, tongue, arms, or legs, trouble swallowing or breathing Redness, blistering, peeling, or loosening of the skin, including inside the mouth Side effects that usually do not require medical attention (report to your care team if they continue or are bothersome): Constipation Diarrhea Fatigue Hair loss Headache Nausea This list may not describe all possible side effects. Call your doctor for medical advice about side effects. You may report side effects to FDA at 1-800-FDA-1088. Where should I keep my medication? This medication is given in a hospital or clinic. It will not be stored at home. NOTE: This sheet is a summary. It may not cover all possible information. If you have questions about this medicine, talk to your doctor, pharmacist, or health care provider.  2023 Elsevier/Gold Standard (2021-08-26 00:00:00)  Ifosfamide Injection What is this  medication? IFOSFAMIDE (eye FOS fa mide) treats some types of cancer. It works by slowing down the growth of cancer cells. This medicine may be used for other purposes; ask your health care provider or pharmacist if you have questions. COMMON BRAND NAME(S): Ifex, Ifex/Mesna What should I tell my care team before I take this medication? They need to know if you have any of these conditions: Heart disease History of irregular heartbeat Immune system problems Infection, such as chickenpox, cold sores, or herpes Infections in the bladder, kidneys, or urinary tract Kidney disease Low blood cell levels, such as white cells, platelets, or red blood cells Lung or breathing disease, such as asthma or COPD Problems urinating Recent or ongoing radiation therapy Tingling of the fingers or toes or other nerve disorder An unusual or allergic reaction to ifosfamide, other medications, foods, dyes, or preservatives Pregnant or trying to get pregnant Breast-feeding How should I use this medication? This medication is injected into a vein. It is given by your care team in a hospital or clinic setting. Talk to your care team about the use of this medication in children. Special care may be needed. Overdosage: If you think you have taken too much of this medicine contact a poison control center or emergency room at once. NOTE: This medicine is only for you. Do not share this medicine with others. What if I miss a dose? Keep appointments for follow-up doses. It is important not to miss your dose. Call your care team if you are unable to keep an appointment. What may interact with this medication? Do not take this medication with any of the following: Live virus vaccines This medication may interact with the following: Aprepitant, fosaprepitant Certain medications for fungal infections, such as fluconazole, itraconazole, and ketoconazole Certain medications for seizures, such as carbamazepine,  phenobarbital, phenytoin Grapefruit, grapefruit juice Rifampin St. John's wort This list may not describe all possible interactions. Give your health care provider a list of all the medicines, herbs, non-prescription drugs, or dietary supplements you use. Also tell them if you smoke, drink alcohol, or use illegal drugs. Some items may interact with your medicine. What should I watch for while using this medication? Visit your care team for checks on your progress. This medication may make you feel generally unwell. This is not uncommon, as chemotherapy can affect healthy cells as well as cancer cells. Report any side effects. Continue your course of treatment even though you feel ill unless your care team tells you to stop. Drink water or other fluids as directed. Urinate often, even at  night. In some cases, you may be given additional medications to help with side effects. Follow all directions for their use. This medication may increase your risk of getting an infection. Call your care team for advice if you get a fever, chills, sore throat, or other symptoms of a cold or flu. Do not treat yourself. Try to avoid being around people who are sick. This medication may increase your risk to bruise or bleed. Call your care team if you notice any unusual bleeding. Be careful brushing and flossing your teeth or using a toothpick because you may get an infection or bleed more easily. If you have any dental work done, tell your dentist you are receiving this medication. Avoid taking medications that contain aspirin, acetaminophen, ibuprofen, naproxen, or ketoprofen unless instructed by your care team. These medications may hide a fever. Talk to your care team if you or your partner wish to become pregnant. This medication can cause serious birth defects if taken during pregnancy and for 6 months after stopping treatment. Do not father a child while taking this medication and for 6 months after stopping  treatment. A reliable form of contraception is recommended while taking this medication and for 6 months after stopping treatment. Talk to your care team about reliable forms of contraception. Do not breast-feed while taking this medication. This medication may cause infertility. Talk to your care team if you are concerned about your fertility. What side effects may I notice from receiving this medication? Side effects that you should report to your care team as soon as possible: Allergic reactions--skin rash, itching, hives, swelling of the face, lips, tongue, or throat Dry cough, shortness of breath or trouble breathing Hallucinations Heart failure--shortness of breath, swelling of the ankles, feet, or hands, sudden weight gain, unusual weakness or fatigue Heart rhythm changes--fast or irregular heartbeat, dizziness, feeling faint or lightheaded, chest pain, trouble breathing Infection--fever, chills, cough, sore throat, wounds that don't heal, pain or trouble when passing urine, general feeling of discomfort or being unwell Kidney injury--decrease in the amount of urine, swelling of the ankles, hands, or feet Liver injury--right upper belly pain, loss of appetite, nausea, light-colored stool, dark yellow or brown urine, yellowing skin or eyes, unusual weakness or fatigue Low red blood cell level--unusual weakness or fatigue, dizziness, headache, trouble breathing Pain, tingling, or numbness in the hands or feet, muscle weakness, change in vision, confusion or trouble speaking, loss of balance or coordination, trouble walking, seizures Red or dark brown urine Unusual bruising or bleeding Unusual changes in behavior Side effects that usually do not require medical attention (report to your care team if they continue or are bothersome): Hair loss Nausea Vomiting This list may not describe all possible side effects. Call your doctor for medical advice about side effects. You may report side  effects to FDA at 1-800-FDA-1088. Where should I keep my medication? This medication is given in a hospital or clinic. It will not be stored at home. NOTE: This sheet is a summary. It may not cover all possible information. If you have questions about this medicine, talk to your doctor, pharmacist, or health care provider.  2023 Elsevier/Gold Standard (2021-07-21 00:00:00)

## 2022-05-18 NOTE — Telephone Encounter (Signed)
Pt has been scheduled for 2/13 @ 9am.

## 2022-05-18 NOTE — Patient Instructions (Signed)

## 2022-05-18 NOTE — Addendum Note (Signed)
Addended by: Regan Rakers on: 05/18/2022 12:24 PM   Modules accepted: Orders

## 2022-05-19 ENCOUNTER — Inpatient Hospital Stay: Payer: BC Managed Care – PPO

## 2022-05-19 VITALS — BP 103/62 | HR 79 | Temp 99.3°F | Resp 17

## 2022-05-19 DIAGNOSIS — C8464 Anaplastic large cell lymphoma, ALK-positive, lymph nodes of axilla and upper limb: Secondary | ICD-10-CM

## 2022-05-19 DIAGNOSIS — C8468 Anaplastic large cell lymphoma, ALK-positive, lymph nodes of multiple sites: Secondary | ICD-10-CM | POA: Diagnosis not present

## 2022-05-19 LAB — CBC WITH DIFFERENTIAL/PLATELET
Abs Immature Granulocytes: 0.05 10*3/uL (ref 0.00–0.07)
Basophils Absolute: 0 10*3/uL (ref 0.0–0.1)
Basophils Relative: 0 %
Eosinophils Absolute: 0 10*3/uL (ref 0.0–0.5)
Eosinophils Relative: 0 %
HCT: 27 % — ABNORMAL LOW (ref 36.0–46.0)
Hemoglobin: 8.9 g/dL — ABNORMAL LOW (ref 12.0–15.0)
Immature Granulocytes: 1 %
Lymphocytes Relative: 18 %
Lymphs Abs: 1.3 10*3/uL (ref 0.7–4.0)
MCH: 31.3 pg (ref 26.0–34.0)
MCHC: 33 g/dL (ref 30.0–36.0)
MCV: 95.1 fL (ref 80.0–100.0)
Monocytes Absolute: 0.6 10*3/uL (ref 0.1–1.0)
Monocytes Relative: 8 %
Neutro Abs: 5.3 10*3/uL (ref 1.7–7.7)
Neutrophils Relative %: 73 %
Platelets: 299 10*3/uL (ref 150–400)
RBC: 2.84 MIL/uL — ABNORMAL LOW (ref 3.87–5.11)
RDW: 18.1 % — ABNORMAL HIGH (ref 11.5–15.5)
WBC: 7.3 10*3/uL (ref 4.0–10.5)
nRBC: 0 % (ref 0.0–0.2)

## 2022-05-19 LAB — URINALYSIS, COMPLETE (UACMP) WITH MICROSCOPIC
Bilirubin Urine: NEGATIVE
Glucose, UA: 50 mg/dL — AB
Ketones, ur: NEGATIVE mg/dL
Leukocytes,Ua: NEGATIVE
Nitrite: NEGATIVE
Protein, ur: NEGATIVE mg/dL
Specific Gravity, Urine: 1.011 (ref 1.005–1.030)
pH: 7 (ref 5.0–8.0)

## 2022-05-19 LAB — PREGNANCY, URINE: Preg Test, Ur: NEGATIVE

## 2022-05-19 MED ORDER — PALONOSETRON HCL INJECTION 0.25 MG/5ML
0.2500 mg | Freq: Once | INTRAVENOUS | Status: AC
  Administered 2022-05-19: 0.25 mg via INTRAVENOUS
  Filled 2022-05-19: qty 5

## 2022-05-19 MED ORDER — HEPARIN SOD (PORK) LOCK FLUSH 100 UNIT/ML IV SOLN
INTRAVENOUS | Status: AC
  Filled 2022-05-19: qty 5

## 2022-05-19 MED ORDER — SODIUM CHLORIDE 0.9 % IV SOLN
100.0000 mg/m2 | Freq: Once | INTRAVENOUS | Status: AC
  Administered 2022-05-19: 180 mg via INTRAVENOUS
  Filled 2022-05-19: qty 9

## 2022-05-19 MED ORDER — HEPARIN SOD (PORK) LOCK FLUSH 100 UNIT/ML IV SOLN
500.0000 [IU] | Freq: Once | INTRAVENOUS | Status: AC | PRN
  Administered 2022-05-19: 500 [IU]
  Filled 2022-05-19: qty 5

## 2022-05-19 MED ORDER — SODIUM CHLORIDE 0.9 % IV SOLN
400.0000 mg/m2 | Freq: Once | INTRAVENOUS | Status: AC
  Administered 2022-05-19: 700 mg via INTRAVENOUS
  Filled 2022-05-19: qty 7

## 2022-05-19 MED ORDER — LORAZEPAM 2 MG/ML IJ SOLN
0.5000 mg | Freq: Once | INTRAMUSCULAR | Status: AC | PRN
  Administered 2022-05-19: 0.5 mg via INTRAVENOUS
  Filled 2022-05-19: qty 1

## 2022-05-19 MED ORDER — SODIUM CHLORIDE 0.9 % IV SOLN
Freq: Once | INTRAVENOUS | Status: AC
  Filled 2022-05-19: qty 250

## 2022-05-19 MED ORDER — SODIUM CHLORIDE 0.9 % IV SOLN
10.0000 mg | Freq: Once | INTRAVENOUS | Status: AC
  Administered 2022-05-19: 10 mg via INTRAVENOUS
  Filled 2022-05-19: qty 10

## 2022-05-19 MED ORDER — SODIUM CHLORIDE 0.9 % IV SOLN
Freq: Once | INTRAVENOUS | Status: AC
  Filled 2022-05-19: qty 54

## 2022-05-19 MED ORDER — HYDROMORPHONE HCL 1 MG/ML IJ SOLN: 0.2500 mg | INTRAMUSCULAR | Status: DC | PRN

## 2022-05-19 NOTE — Patient Instructions (Signed)
[facility]  Discharge Instructions: Thank you for choosing [facility] to provide your oncology and hematology care.  If you have a lab appointment with the [facility], please go directly to the [facility] and check in at the registration area.  Wear comfortable clothing and clothing appropriate for easy access to any Portacath or PICC line.   We strive to give you quality time with your provider. You may need to reschedule your appointment if you arrive late (15 or more minutes).  Arriving late affects you and other patients whose appointments are after yours.  Also, if you miss three or more appointments without notifying the office, you may be dismissed from the clinic at the provider's discretion.      For prescription refill requests, have your pharmacy contact our office and allow 72 hours for refills to be completed.    Today you received the following chemotherapy and/or immunotherapy agents: Etoposide, Mesna and IFEX  To help prevent nausea and vomiting after your treatment, we encourage you to take your nausea medication as directed.  BELOW ARE SYMPTOMS THAT SHOULD BE REPORTED IMMEDIATELY: *FEVER GREATER THAN 100.4 F (38 C) OR HIGHER *CHILLS OR SWEATING *NAUSEA AND VOMITING THAT IS NOT CONTROLLED WITH YOUR NAUSEA MEDICATION *UNUSUAL SHORTNESS OF BREATH *UNUSUAL BRUISING OR BLEEDING *URINARY PROBLEMS (pain or burning when urinating, or frequent urination) *BOWEL PROBLEMS (unusual diarrhea, constipation, pain near the anus) TENDERNESS IN MOUTH AND THROAT WITH OR WITHOUT PRESENCE OF ULCERS (sore throat, sores in mouth, or a toothache) UNUSUAL RASH, SWELLING OR PAIN  UNUSUAL VAGINAL DISCHARGE OR ITCHING   Items with * indicate a potential emergency and should be followed up as soon as possible or go to the Emergency Department if any problems should occur.  Please show the CHEMOTHERAPY ALERT CARD or IMMUNOTHERAPY ALERT CARD at  check-in to the Emergency Department and triage nurse.  Should you have questions after your visit or need to cancel or reschedule your appointment, please contact [facility]  [phone] and follow the prompts.  Office hours are 8:00 a.m. to 4:30 p.m. Monday - Friday. Please note that voicemails left after 4:00 p.m. may not be returned until the following business day.  We are closed weekends and major holidays. You have access to a nurse at all times for urgent questions. Please call the main number to the clinic [phone] and follow the prompts.  For any non-urgent questions, you may also contact your provider using MyChart. We now offer e-Visits for anyone 31 and older to request care online for non-urgent symptoms. For details visit [email or web address].   Also download the MyChart app! Go to the app store, search "MyChart", open the app, select [facility], and log in with your MyChart username and password.

## 2022-05-19 NOTE — Progress Notes (Signed)
Okay to start pre-meds while waiting for urine and CBC results per Dr. Tasia Catchings.    Urine HBG- Moderate  Urine Bacteria- Rare Per Dr. Tasia Catchings proceed with treatment as scheduled and encourage oral hydration. Pt made aware.

## 2022-05-22 ENCOUNTER — Inpatient Hospital Stay: Payer: BC Managed Care – PPO

## 2022-05-22 VITALS — BP 126/80 | HR 94 | Temp 99.1°F | Resp 20

## 2022-05-22 DIAGNOSIS — C8464 Anaplastic large cell lymphoma, ALK-positive, lymph nodes of axilla and upper limb: Secondary | ICD-10-CM

## 2022-05-22 DIAGNOSIS — C8468 Anaplastic large cell lymphoma, ALK-positive, lymph nodes of multiple sites: Secondary | ICD-10-CM | POA: Diagnosis not present

## 2022-05-22 MED ORDER — PEGFILGRASTIM-CBQV 6 MG/0.6ML ~~LOC~~ SOSY
6.0000 mg | PREFILLED_SYRINGE | Freq: Once | SUBCUTANEOUS | Status: AC
  Administered 2022-05-22: 6 mg via SUBCUTANEOUS
  Filled 2022-05-22: qty 0.6

## 2022-05-22 MED ORDER — SODIUM CHLORIDE 0.9 % IV SOLN
INTRAVENOUS | Status: DC
  Filled 2022-05-22: qty 250

## 2022-05-22 MED ORDER — SODIUM CHLORIDE (PF) 0.9 % IJ SOLN
Freq: Once | INTRAMUSCULAR | Status: AC
  Filled 2022-05-22: qty 0.48

## 2022-05-22 NOTE — Progress Notes (Signed)
Pt declined IVF today due to work issues, states she will increase PO fluid intake. VSS. Udenyca shot given

## 2022-05-22 NOTE — Progress Notes (Signed)
Patient for DG Methtrotrexate Inj on Tuesday 05/23/2022, I called and spoke with the patient on the phone and gave pre-procedure instructions. Pt was made aware to be here at [facility] at the new entrance and at [facility] (per [facility]) so we can order the medicine from the Pharmacy and start procedure on time. Pt stated understanding.  Called 05/22/2022

## 2022-05-23 ENCOUNTER — Ambulatory Visit
Admission: RE | Admit: 2022-05-23 | Discharge: 2022-05-23 | Disposition: A | Discharge status: Home / Self Care | Payer: BC Managed Care – PPO | Source: Ambulatory Visit | Attending: Oncology | Admitting: Oncology

## 2022-05-23 ENCOUNTER — Ambulatory Visit: Payer: BC Managed Care – PPO

## 2022-05-23 ENCOUNTER — Other Ambulatory Visit: Payer: Self-pay

## 2022-05-23 VITALS — BP 109/53 | HR 76 | Resp 16 | Ht 62.0 in | Wt 160.0 lb

## 2022-05-23 DIAGNOSIS — C8464 Anaplastic large cell lymphoma, ALK-positive, lymph nodes of axilla and upper limb: Secondary | ICD-10-CM | POA: Insufficient documentation

## 2022-05-23 HISTORY — DX: Malignant (primary) neoplasm, unspecified: C80.1

## 2022-05-23 MED ORDER — LIDOCAINE HCL (PF) 1 % IJ SOLN
10.0000 mL | Freq: Once | INTRAMUSCULAR | Status: AC
  Administered 2022-05-23: 10 mL
  Filled 2022-05-23: qty 10

## 2022-05-23 MED ORDER — ACETAMINOPHEN 325 MG PO TABS: 650.0000 mg | ORAL_TABLET | ORAL | Status: DC | PRN

## 2022-05-23 NOTE — Procedures (Signed)
Technically successful fluoro guided LP with methotrexate injection at L2-L3 level.   No labs were ordered by ordering provider.   '12mg'$  methotrexate successfully administered intrathecally.    No immediate post procedural complication.   Please see imaging section of Epic for full dictation.     Reatha Armour, PA-C 05/23/2022, 11:02 AM

## 2022-05-26 ENCOUNTER — Encounter: Payer: Self-pay | Admitting: Oncology

## 2022-05-31 ENCOUNTER — Other Ambulatory Visit: Payer: Self-pay | Admitting: Oncology

## 2022-05-31 DIAGNOSIS — C8464 Anaplastic large cell lymphoma, ALK-positive, lymph nodes of axilla and upper limb: Secondary | ICD-10-CM

## 2022-06-02 ENCOUNTER — Telehealth: Payer: Self-pay | Admitting: *Deleted

## 2022-06-02 NOTE — Telephone Encounter (Signed)
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 I could not see anything other than flow in her chart and no CD 30 results are there, I called path at [facility] and spoke with Kevan Ny who said biopsy was not done at [facility], it was done at [facility] so he gave me their number [phone] which when called was the flow cytology department and not path so she transferred me to path who said that patient did not have path done that day, only flow and she sent me back to that department. I was told that they do not do testing for CD 3o at that lab and when asked, she said that they only keep material for testing for a week after obtained so no testing can be added. Dr Chauncey Cruel had wanted the report to be faxed to Doristine [facility]worth at [phone] when found. I will have to call her back to let her know that there is np core biopsy from that date

## 2022-06-02 NOTE — Telephone Encounter (Signed)
The first surg report on 03/16/22. You have to click on view image for the report to pop up.   Will fax to Dr. Chauncey Cruel.

## 2022-06-02 NOTE — Telephone Encounter (Signed)
I did speak with Dr Edwena Felty again and let her know my findings and she asked if the diagnosed was made off the Flow done on 03/06/22 which I could not answer. She thanked me for researching this and said that they will stop looking for it now. Are you aware of any biopsy done to confirm relapse?

## 2022-06-02 NOTE — Telephone Encounter (Signed)
This is what I can find from path report on 03/06/22. Is this what Dr. Edwena Felty is looking for?   THE CLINICAL HISTORY OF AN ANAPLASTIC LARGE CELL LYMPHOMA, ALK POSITIVE ([id number]) IS NOTED. THE CURRENT BIOPSY SHOWS SHEETS OF MEDIUM TO LARGE SIZED CELLS WITH SIGNIFICANT PLEOMORPHISM/ANAPLASIA INCLUDING "HALLMARK TYPE CELLS. THERE IS A EXTREMELY HIGH MITOTIC/APOPTOTIC INDEX. THE CELLS ARE STRONGLY POSITIVE FOR ALK, CD30. OF THE T-CELL MARKERS CD43 IS STRONGLY UNIVERSALLY POSITIVE. CD3 AND CD5 SHOWS SCATTERED POSITIVITY, BUT THE MAJORITY OF THE CELLS ARE BACKGROUND SMALL T CELLS. CD4 SHOWS A SIMILAR PATTERN BUT WITH SLIGHTLY MORE POSITIVE LESIONAL CELLS THAN CD3/CD5. CD8 IS PREDOMINANTLY POSITIVE IN SMALL BACKGROUND T CELLS. CD56, BCL-2, BCL6, CD10, CD20, CD21 AND CD23 ARE ALL NEGATIVE. CD45 IS POSITIVE IN THE MAJORITY OF CELLS BUT HAS A RANGE OF WEAK TO STRONG POSITIVITY. THE PROLIFERATION RATE BY KI-67 IS NEARLY 100%.Marland Kitchen FLOW CYTOMETRY IS OVERALL NONCONTRIBUTORY.

## 2022-06-05 ENCOUNTER — Encounter: Payer: Self-pay | Admitting: Oncology

## 2022-06-06 ENCOUNTER — Encounter: Payer: Self-pay | Admitting: Oncology

## 2022-06-06 ENCOUNTER — Inpatient Hospital Stay: Payer: BC Managed Care – PPO | Attending: Nurse Practitioner

## 2022-06-06 ENCOUNTER — Inpatient Hospital Stay (HOSPITAL_BASED_OUTPATIENT_CLINIC_OR_DEPARTMENT_OTHER): Payer: BC Managed Care – PPO | Admitting: Oncology

## 2022-06-06 VITALS — BP 107/59 | HR 88 | Temp 99.2°F | Wt 158.5 lb

## 2022-06-06 DIAGNOSIS — F418 Other specified anxiety disorders: Secondary | ICD-10-CM | POA: Diagnosis not present

## 2022-06-06 DIAGNOSIS — R112 Nausea with vomiting, unspecified: Secondary | ICD-10-CM | POA: Insufficient documentation

## 2022-06-06 DIAGNOSIS — D6481 Anemia due to antineoplastic chemotherapy: Secondary | ICD-10-CM | POA: Diagnosis not present

## 2022-06-06 DIAGNOSIS — F411 Generalized anxiety disorder: Secondary | ICD-10-CM

## 2022-06-06 DIAGNOSIS — T451X5A Adverse effect of antineoplastic and immunosuppressive drugs, initial encounter: Secondary | ICD-10-CM | POA: Diagnosis not present

## 2022-06-06 DIAGNOSIS — Z5111 Encounter for antineoplastic chemotherapy: Secondary | ICD-10-CM | POA: Insufficient documentation

## 2022-06-06 DIAGNOSIS — Z5189 Encounter for other specified aftercare: Secondary | ICD-10-CM | POA: Diagnosis not present

## 2022-06-06 DIAGNOSIS — C8468 Anaplastic large cell lymphoma, ALK-positive, lymph nodes of multiple sites: Secondary | ICD-10-CM | POA: Diagnosis present

## 2022-06-06 DIAGNOSIS — C8464 Anaplastic large cell lymphoma, ALK-positive, lymph nodes of axilla and upper limb: Secondary | ICD-10-CM

## 2022-06-06 DIAGNOSIS — Z8041 Family history of malignant neoplasm of ovary: Secondary | ICD-10-CM | POA: Diagnosis not present

## 2022-06-06 DIAGNOSIS — C801 Malignant (primary) neoplasm, unspecified: Secondary | ICD-10-CM

## 2022-06-06 DIAGNOSIS — Z95828 Presence of other vascular implants and grafts: Secondary | ICD-10-CM

## 2022-06-06 LAB — URINALYSIS, COMPLETE (UACMP) WITH MICROSCOPIC
Bilirubin Urine: NEGATIVE
Glucose, UA: NEGATIVE mg/dL
Hgb urine dipstick: NEGATIVE
Ketones, ur: NEGATIVE mg/dL
Leukocytes,Ua: NEGATIVE
Nitrite: NEGATIVE
Protein, ur: NEGATIVE mg/dL
Specific Gravity, Urine: 1.013 (ref 1.005–1.030)
pH: 5 (ref 5.0–8.0)

## 2022-06-06 LAB — CBC WITH DIFFERENTIAL/PLATELET
Abs Immature Granulocytes: 0.77 10*3/uL — ABNORMAL HIGH (ref 0.00–0.07)
Basophils Absolute: 0.1 10*3/uL (ref 0.0–0.1)
Basophils Relative: 1 %
Eosinophils Absolute: 0 10*3/uL (ref 0.0–0.5)
Eosinophils Relative: 0 %
HCT: 25.1 % — ABNORMAL LOW (ref 36.0–46.0)
Hemoglobin: 8 g/dL — ABNORMAL LOW (ref 12.0–15.0)
Immature Granulocytes: 9 %
Lymphocytes Relative: 20 %
Lymphs Abs: 1.7 10*3/uL (ref 0.7–4.0)
MCH: 31.6 pg (ref 26.0–34.0)
MCHC: 31.9 g/dL (ref 30.0–36.0)
MCV: 99.2 fL (ref 80.0–100.0)
Monocytes Absolute: 0.8 10*3/uL (ref 0.1–1.0)
Monocytes Relative: 9 %
Neutro Abs: 5.2 10*3/uL (ref 1.7–7.7)
Neutrophils Relative %: 61 %
Platelets: 186 10*3/uL (ref 150–400)
RBC: 2.53 MIL/uL — ABNORMAL LOW (ref 3.87–5.11)
RDW: 21.2 % — ABNORMAL HIGH (ref 11.5–15.5)
Smear Review: NORMAL
WBC: 8.4 10*3/uL (ref 4.0–10.5)
nRBC: 0.8 % — ABNORMAL HIGH (ref 0.0–0.2)

## 2022-06-06 LAB — COMPREHENSIVE METABOLIC PANEL
ALT: 29 U/L (ref 0–44)
AST: 25 U/L (ref 15–41)
Albumin: 4.3 g/dL (ref 3.5–5.0)
Alkaline Phosphatase: 65 U/L (ref 38–126)
Anion gap: 10 (ref 5–15)
BUN: 13 mg/dL (ref 6–20)
CO2: 24 mmol/L (ref 22–32)
Calcium: 9.4 mg/dL (ref 8.9–10.3)
Chloride: 103 mmol/L (ref 98–111)
Creatinine, Ser: 0.8 mg/dL (ref 0.44–1.00)
GFR, Estimated: >60 mL/min (ref >60)
Glucose, Bld: 101 mg/dL — ABNORMAL HIGH (ref 70–99)
Potassium: 4.2 mmol/L (ref 3.5–5.1)
Sodium: 137 mmol/L (ref 135–145)
Total Bilirubin: 0.3 mg/dL (ref 0.3–1.2)
Total Protein: 7.7 g/dL (ref 6.5–8.1)

## 2022-06-06 LAB — PREGNANCY, URINE: Preg Test, Ur: NEGATIVE

## 2022-06-06 MED ORDER — HEPARIN SOD (PORK) LOCK FLUSH 100 UNIT/ML IV SOLN
500.0000 [IU] | Freq: Once | INTRAVENOUS | Status: AC
  Administered 2022-06-06: 500 [IU] via INTRAVENOUS
  Filled 2022-06-06: qty 5

## 2022-06-06 MED ORDER — SODIUM CHLORIDE 0.9% FLUSH
10.0000 mL | Freq: Once | INTRAVENOUS | Status: AC
  Administered 2022-06-06: 10 mL via INTRAVENOUS
  Filled 2022-06-06: qty 10

## 2022-06-06 MED FILL — Dexamethasone Sodium Phosphate Inj 100 MG/10ML: INTRAMUSCULAR | Qty: 1 | Status: AC

## 2022-06-06 NOTE — Assessment & Plan Note (Signed)
Xanax PRN  

## 2022-06-06 NOTE — Assessment & Plan Note (Addendum)
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 CSF flowcytometry pending.  Recommend salvage therapy bridging to allogenic transplant Labs are reviewed and discussed with patient. PET after 2 cycles- deauville 3  S/p cycle 3  ICE with GCSF support. Proceed with Cycle 4  Day 1 Ifosfamide, Etoposide, Mesna Day 2 Ifosfamide, Etoposide, Carboplatin, Mesna Day 3 Ifosfamide, Etoposide, Mesna Day 7 GCSF + IVF hydration.  Day 8 intrathecal triple therapy with MTX '12mg'$ , Ara C '40mg'$  and hydrocortisone 100 mg Encourage oral hydration.  Will repeat PET scan for evaluation of disease status and treatment response.  Patient has establish care with [facility], pending decision of autologous versus allogenic transplant.

## 2022-06-06 NOTE — Assessment & Plan Note (Signed)
Hemoglobin trend down to 8. Plan PRBC transfusion next week.

## 2022-06-06 NOTE — Assessment & Plan Note (Signed)
Chemotherapy plan as listed above 

## 2022-06-06 NOTE — Assessment & Plan Note (Signed)
Recommend antimetics, Zofran, Compazine PRN,  as instructed

## 2022-06-06 NOTE — Progress Notes (Signed)
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 CSF flowcytometry pending.  Recommend salvage therapy bridging to allogenic transplant Labs are reviewed and discussed with patient. PET after 2 cycles- deauville 3  S/p cycle 3  ICE with GCSF support. Proceed with Cycle 4  Day 1 Ifosfamide, Etoposide, Mesna Day 2 Ifosfamide, Etoposide, Carboplatin, Mesna Day 3 Ifosfamide, Etoposide, Mesna Day 7 GCSF + IVF hydration.  Day 8 intrathecal triple therapy with MTX '12mg'$ , Ara C '40mg'$  and hydrocortisone 100 mg Encourage oral hydration.  Will repeat PET scan for evaluation of disease status and treatment response.  Patient has establish care with [facility], pending decision of autologous versus allogenic transplant.  Anxiety associated with cancer diagnosis ([place])  Xanax PRN  Chemotherapy induced nausea and vomiting Recommend antimetics, Zofran, Compazine PRN,  as instructed  Encounter for antineoplastic chemotherapy Chemotherapy plan as listed above.   Anemia due to antineoplastic chemotherapy Hemoglobin trend down to 8. Plan PRBC transfusion next week.  Orders Placed This Encounter  Procedures   NM PET Image Restag (PS) Skull Base To Thigh    Standing Status:   Future     Standing Expiration Date:   06/06/2023    Order Specific Question:   If indicated for the ordered procedure, I authorize the administration of a radiopharmaceutical per Radiology protocol    Answer:   Yes    Order Specific Question:   Is the patient pregnant?    Answer:   No    Order Specific Question:   Preferred imaging location?    Answer:   [facility]   CBC with Differential/Platelet    Standing Status:   Future    Standing Expiration Date:   06/07/2023   Sample to Blood Bank    Standing Status:   Future    Standing Expiration Date:   06/07/2023   Follow-up TBD, pending [facility] bone marrow transplant team decision  All questions were answered. The patient knows to call the clinic with any problems, questions or concerns.  Earlie Server, MD, PhD [facility] Hematology Oncology 06/06/2022     HISTORY OF PRESENTING ILLNESS:   Angel Tate is a  32 y.o.  female presents for follow-up of ALK positive anaplastic large cell lymphoma Oncology history summary is listed below. 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  Initially the mass did not bother her.  Patient developed a rash in the right axillary/upper outer quadrant of the  breast and had additional work-up.  Patient was treated with a 10-day course of doxycycline for possible infection.  Did not improve.  #02/04/2020, targeted ultrasound showed right axillary mass measuring 6.2 x 4 by 4 cm.  There are 2 adjacent smaller axillary mass measuring 2.0 x 1.2 x 1.4 cm and 0.8 x 0.7 x 0.9 cm.  Mammogram showed dense  fibroglandular.  No suspicious mass or malignant type microcalcifications or distortion detected in either breast.  # Patient underwent ultrasound-guided core biopsy of the right axillary mass. 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  Ki-67 80-90%, Flow cytometry has insufficient cells for analysis.    02/17/2020 Bone Marrow Biopsy   Bone marrow biopsy is negative   02/18/2020 Echocardiogram   Echocardiogram showed normal LVEF   03/02/2020 Imaging   PET scan showed right axillary pathological adenopathy, largest lymph node 3.5 cm with maximum SUV 42.8.  An adjacent smaller peripheral right axillary lymph node measuring 1.3 cm with SUV of 23.3. Anterior mediastinal density favors benign thymic tissue maximum SUV 3.0 which is due Deuville 4, far below the level of last has lymphadenopathy.   03/03/2020 - 05/30/2020 Chemotherapy   03/03/2020 - 05/30/2020 4 cycles of BV AVD Q21 days.   Patient does not desire to preserve fertility.  She has IUD for contraceptive measures.   05/20/2020 Imaging   PET showed complete response   06/02/2020 - 06/25/2020 Chemotherapy   2 cycles of BV AVD Q21 days   09/01/2020 Imaging   PET scan  1. No signs to suggest residual or recurrent FDG avid tumor.2. Resolution of previously noted generalized marrow hypermetabolism.3. FDG avid brown fat noted within the neck and chest compatible with benign physiologic activity.   09/13/2020 Procedure   Her Mediport was removed by Dr. Lucky Cowboy.   03/01/2021 Imaging   Surveillance PET showed No evidence of active lymphoma.   10/13/2021 Imaging   Surveillance PET showed  1. No evidence of lymphoma recurrence. No lymphadenopathy. Normal spleen and bone marrow.2. Particular attention directed to the RIGHT axilla.3. Benign cyst of the LEFT ovary   03/03/2022 Mammogram   Patient palpated left breast mass with rapid growth.   Bilateral diagnostic mammogram/ left axilla  showed 1. Suspicious 2.4 cm mass  along the 11 o'clock axis of the left breast. Recommend ultrasound-guided biopsy. 2. No suspicious left axillary lymphadenopathy. 3. No mammographic evidence of malignancy on the right.   03/06/2022 Relapse/Recurrence   Left breast mass biopsy showed ALK positive anaplastic large cell lymphoma. Ki67 100% Flowcytometry is overall non contributory.    03/15/2022 Imaging   PET restaging showed  1. New hypermetabolic round mass in the medial LEFT breast consistent with lymphoma recurrence. 2. New lesion in the mid body of the pancreas consistent with lymphoma recurrence/metastasis. 3. Very small hypermetabolic RIGHT supraclavicular node is concerning for lymphoma. 4. Two pulmonary nodules in the RIGHT lung are new from prior. Indeterminate nodules.    03/21/2022 Cancer Staging   Staging form: Hodgkin and Non-Hodgkin Lymphoma, AJCC 8th Edition - Clinical stage from 03/21/2022: Stage IV - Signed by Earlie Server, MD on 04/08/2022 Histopathologic type: Anaplastic large cell lymphoma, T cell and Null cell type Stage prefix: Recurrence   03/28/2022 Echocardiogram   2D Echo LVEF 60-65%   03/28/2022 Bone Marrow Biopsy   Bone marrow biopsy showed  Aspirate : no immunophenotypic evidence of a lymphoproliferative disorder (no monoclonal B cells or immunophenotypically abnormal T cells detected).  Core biopsy showed Hypocellular bone marrow (  30%) with otherwise orderly trilineage hematopoiesis. -No morphologic or immunohistochemical evidence of the patient's known  recurrent anaplastic large cell lymphoma.      04/04/2022 -  Chemotherapy   Patient is on Treatment Plan : NON-HODGKINS LYMPHOMA ICE q21d   Intrathecal with MTX '12mg'$ , Ara C '40mg'$  and hydrocortisone 100 mg    04/04/2022 Procedure   Lumbar puncture   CSF flowcytometry  Insufficient B and T cells detected to adequately evaluate for clonality or aberrancy in a low cellularity specimen,    04/20/2022 Procedure   S/p placement of medi port  by Dr.Dew   05/09/2022 Imaging   PET restaging 1. Findings suggest a good response to treatment. The left medial breast lesion is smaller and significantly less hypermetabolic. The right supraclavicular node has resolved and the pancreatic lesion has resolved. SUV < liver  2. No new disease is demonstrated. 3. Diffuse marrow hypermetabolism likely chemotherapy effect or due    Patient presents for evaluation prior to chemotherapy.  She tolerated chemo reviewed with manageable nausea symptoms. Worsen fatigue after last chemotherapy treatments.  Denies shortness of breath, chest pain, lightheadedness.   Review of Systems  Constitutional:  Positive for fatigue. Negative for appetite change, chills, fever and unexpected weight change.  HENT:   Negative for hearing loss and voice change.   Eyes:  Negative for eye problems.  Respiratory:  Negative for chest tightness, cough and shortness of breath.   Cardiovascular:  Negative for chest pain.  Gastrointestinal:  Positive for nausea. Negative for abdominal distention, abdominal pain, blood in stool and vomiting.  Endocrine: Negative for hot flashes.  Genitourinary:  Negative for difficulty urinating and frequency.   Musculoskeletal:  Negative for arthralgias.  Skin:  Negative for itching and rash.  Neurological:  Negative for extremity weakness and headaches.  Hematological:  Negative for adenopathy.  Psychiatric/Behavioral:  Negative for confusion. The patient is not nervous/anxious.     MEDICAL HISTORY:  Past Medical History:  Diagnosis Date   Anxiety    Cancer [facility])    Depression    Medical history non-contributory     SURGICAL HISTORY: Past Surgical History:  Procedure Laterality Date   BREAST BIOPSY Left 03/06/2022   [place] LT BREAST BX W LOC DEV 1ST LESION IMG BX SPEC US GUIDE 03/06/2022 [facility] MAMMOGRAPHY   DILATION AND CURETTAGE OF UTERUS     DILATION AND EVACUATION N/A 02/17/2017   Procedure: DILATATION AND EVACUATION;  Surgeon:  Janyth Pupa, DO;  Location: [facility];  Service: Gynecology;  Laterality: N/A;   IR FL GUIDED LOC OF NEEDLE/CATH TIP FOR SPINAL INJECTION LT  04/04/2022   IR FLUORO GUIDED NEEDLE PLC ASPIRATION/INJECTION LOC  04/07/2022   PORTA CATH INSERTION N/A 02/23/2020   Procedure: PORTA CATH INSERTION;  Surgeon: Algernon Huxley, MD;  Location: [place] [facility];  Service: Cardiovascular;  Laterality: N/A;   PORTA CATH INSERTION N/A 04/20/2022   Procedure: PORTA CATH INSERTION;  Surgeon: Algernon Huxley, MD;  Location: [place] [facility];  Service: Cardiovascular;  Laterality: N/A;   PORTA CATH REMOVAL N/A 09/13/2020   Procedure: PORTA CATH REMOVAL;  Surgeon: Algernon Huxley, MD;  Location: [place] [facility];  Service: Cardiovascular;  Laterality: N/A;    SOCIAL HISTORY: Social History   Socioeconomic History   Marital status: Married    Spouse name: [name]   Number of children: 1   Years of education: Not on file   Highest education level: Not on file  Occupational History  Occupation: Secondary school teacher- marketing   Tobacco Use   Smoking status: Never   Smokeless tobacco: Never  Vaping Use   Vaping Use: Never used  Substance and Sexual Activity   Alcohol use: Yes    Comment: socially   Drug use: No   Sexual activity: Yes    Birth control/protection: None  Other Topics Concern   Not on file  Social History Narrative   Not on file   Social Determinants of Health   Financial Resource Strain: Low Risk  (01/16/2019)   Overall Financial Resource Strain (CARDIA)    Difficulty of Paying Living Expenses: Not hard at all  Food Insecurity: No Food Insecurity (01/16/2019)   Hunger Vital Sign    Worried About Running Out of Food in the Last Year: Never true    Ran Out of Food in the Last Year: Never true  Transportation Needs: No Transportation Needs (01/16/2019)   PRAPARE - Hydrologist (Medical): No    Lack of Transportation (Non-Medical): No  Physical Activity: Unknown  (01/19/2019)   Exercise Vital Sign    Days of Exercise per Week: Patient refused    Minutes of Exercise per Session: Patient refused  Stress: No Stress Concern Present (01/19/2019)   [place]    Feeling of Stress : Only a little  Social Connections: Unknown (01/19/2019)   Social Connection and Isolation Panel [NHANES]    Frequency of Communication with Friends and Family: Patient refused    Frequency of Social Gatherings with Friends and Family: Patient refused    Attends Religious Services: Patient refused    Active Member of Clubs or Organizations: Patient refused    Attends Archivist Meetings: Patient refused    Marital Status: Patient refused  Intimate Partner Violence: Not At Risk (01/19/2019)   Humiliation, Afraid, Rape, and Kick questionnaire    Fear of Current or Ex-Partner: No    Emotionally Abused: No    Physically Abused: No    Sexually Abused: No    FAMILY HISTORY: Family History  Problem Relation Age of Onset   Asthma Maternal Aunt    Hypertension Maternal Grandmother    Ovarian cancer Paternal Grandmother        dx 92s-80s    ALLERGIES:  has No Known Allergies.  MEDICATIONS:  Current Outpatient Medications  Medication Sig Dispense Refill   acetaminophen (TYLENOL) 500 MG tablet Take 500 mg by mouth every 6 (six) hours as needed.     allopurinol (ZYLOPRIM) 300 MG tablet TAKE 1 TABLET BY MOUTH EVERY DAY 90 tablet 0   ALPRAZolam (XANAX) 0.5 MG tablet Take 1 tablet (0.5 mg total) by mouth daily as needed for anxiety. 7 tablet 0   butalbital-acetaminophen-caffeine (FIORICET) 50-325-40 MG tablet Take 1 tablet by mouth every 6 (six) hours as needed for headache. 20 tablet 0   chlorhexidine (PERIDEX) 0.12 % solution Use as directed 5 mLs in the mouth or throat 2 (two) times daily. 473 mL 0   dexamethasone (DECADRON) 4 MG tablet Take 2 tablets (8 mg total) by mouth daily. Start the day after  chemotherapy for 2 days. Take with food. 30 tablet 1   docusate sodium (COLACE) 100 MG capsule Take 1 capsule (100 mg total) by mouth 2 (two) times daily. 10 capsule 0   lidocaine-prilocaine (EMLA) cream Apply small amount to port-a-cath site approximately 1-2 hours to port being accessed 30 g 3   ondansetron (ZOFRAN)  8 MG tablet Take 1 tablet (8 mg total) by mouth every 8 (eight) hours as needed for nausea or vomiting. Start 2 days after chemotherapy. 30 tablet 1   oxyCODONE (OXY IR/ROXICODONE) 5 MG immediate release tablet Take 1-2 tablets (5-10 mg total) by mouth every 6 (six) hours as needed for severe pain. 90 tablet 0   prochlorperazine (COMPAZINE) 10 MG tablet Take 1 tablet (10 mg total) by mouth every 6 (six) hours as needed for nausea or vomiting. 30 tablet 1   venlafaxine (EFFEXOR) 37.5 MG tablet TAKE 1 TABLET BY MOUTH EVERY DAY 90 tablet 1   escitalopram (LEXAPRO) 10 MG tablet Take 10 mg by mouth daily. (Patient not taking: Reported on 04/25/2022)     escitalopram (LEXAPRO) 5 MG tablet Take 5 mg by mouth daily. (Patient not taking: Reported on 03/21/2022)     loperamide (IMODIUM) 2 MG capsule Take 1 capsule (2 mg total) by mouth See admin instructions. Initial: 4 mg, followed by 2 mg every 2 to 4 hours or after each loose stool; Max 16 mg /24 hours (Patient not taking: Reported on 09/13/2020) 60 capsule 1   naloxone (NARCAN) nasal spray 4 mg/0.1 mL SPRAY 1 SPRAY INTO ONE NOSTRIL AS DIRECTED FOR OPIOID OVERDOSE (TURN PERSON ON SIDE AFTER DOSE. IF NO RESPONSE IN 2-3 MINUTES OR PERSON RESPONDS BUT RELAPSES, REPEAT USING A NEW SPRAY DEVICE AND SPRAY INTO THE OTHER NOSTRIL. CALL 911 AFTER USE.) * EMERGENCY USE ONLY * (Patient not taking: Reported on 04/25/2022) 1 each 0   promethazine (PHENERGAN) 25 MG tablet Take 1 tablet (25 mg total) by mouth every 6 (six) hours as needed for nausea or vomiting. (Patient not taking: Reported on 05/16/2022) 120 tablet 2   No current facility-administered  medications for this visit.   Facility-Administered Medications Ordered in Other Visits  Medication Dose Route Frequency Provider Last Rate Last Admin   heparin lock flush 100 UNIT/ML injection            sodium chloride flush (NS) 0.9 % injection 10 mL  10 mL Intravenous PRN Earlie Server, MD   10 mL at 05/16/22 0945     PHYSICAL EXAMINATION: ECOG PERFORMANCE STATUS: 1 - Symptomatic but completely ambulatory Vitals:   06/06/22 1036  BP: (!) 107/59  Pulse: 88  Temp: 99.2 F (37.3 C)  SpO2: 100%   Filed Weights   06/06/22 1036  Weight: 158 lb 8 oz (71.9 kg)     Physical Exam Constitutional:      General: She is not in acute distress. HENT:     Head: Normocephalic and atraumatic.  Eyes:     General: No scleral icterus. Cardiovascular:     Rate and Rhythm: Normal rate and regular rhythm.  Pulmonary:     Effort: Pulmonary effort is normal. No respiratory distress.     Breath sounds: No wheezing.  Abdominal:     General: Bowel sounds are normal. There is no distension.     Palpations: Abdomen is soft.  Musculoskeletal:        General: No deformity. Normal range of motion.     Cervical back: Normal range of motion and neck supple.  Skin:    General: Skin is warm and dry.     Findings: Lesion present.  Neurological:     Mental Status: She is alert and oriented to person, place, and time. Mental status is at baseline.     Cranial Nerves: No cranial nerve deficit.     Coordination:  Coordination normal.  Psychiatric:        Mood and Affect: Mood normal.   04/04/22   LABORATORY DATA:  I have reviewed the data as listed     Latest Ref Rng & Units 06/06/2022   10:05 AM 05/19/2022    8:01 AM 05/18/2022    8:00 AM  CBC  WBC 4.0 - 10.5 K/uL 8.4  7.3  8.6   Hemoglobin 12.0 - 15.0 g/dL 8.0  8.9  9.1   Hematocrit 36.0 - 46.0 % 25.1  27.0  27.2   Platelets 150 - 400 K/uL 186  299  242       Latest Ref Rng & Units 06/06/2022   10:05 AM 05/16/2022    9:38 AM 04/25/2022    9:14  AM  CMP  Glucose 70 - 99 mg/dL 101  95  80   BUN 6 - 20 mg/dL '13  11  11   '$ Creatinine 0.44 - 1.00 mg/dL 0.80  0.73  0.63   Sodium 135 - 145 mmol/L 137  135  137   Potassium 3.5 - 5.1 mmol/L 4.2  4.0  4.0   Chloride 98 - 111 mmol/L 103  104  106   CO2 22 - 32 mmol/L '24  24  26   '$ Calcium 8.9 - 10.3 mg/dL 9.4  8.8  8.8   Total Protein 6.5 - 8.1 g/dL 7.7  7.3  7.2   Total Bilirubin 0.3 - 1.2 mg/dL 0.3  0.2  0.5   Alkaline Phos 38 - 126 U/L 65  56  61   AST 15 - 41 U/L '25  21  25   '$ ALT 0 - 44 U/L '29  20  20     '$ Iron/TIBC/Ferritin/ %Sat No results found for: "IRON", "TIBC", "FERRITIN", "IRONPCTSAT"    RADIOGRAPHIC STUDIES: I have personally reviewed the radiological images as listed and agreed with the findings in the report. DG FLUORO GUIDED LOC OF NEEDLE/CATH TIP FOR SPINAL INJECT LT  Result Date: 05/23/2022 CLINICAL DATA:  Anaplastic alk positive large cell lymphoma of lymph nodes of axilla. Request for intrathecal administration of methotrexate, cytarabine, and hydrocortisone EXAM: FLUOROSCOPICALLY GUIDED LUMBAR PUNCTURE FOR INTRATHECAL CHEMOTHERAPY TECHNIQUE: This procedure was performed by Reatha Armour, PA-C and supervised and interpreted by Dr. Maurine Simmering. Informed consent was obtained from the patient prior to the procedure, including potential complications of headache, allergy, and pain. A 'time out' was performed. With the patient prone, the lower back was prepped with Betadine. 1% Lidocaine was used for local anesthesia. Lumbar puncture was performed at the L2-L3 using a 20 gauge needle with return of clear CSF. 12 mg of methotrexate was injected into the subarachnoid space. The patient tolerated the procedure well without apparent complication. FLUOROSCOPY: 1.80 mGy kerma IMPRESSION: Intrathecal injection of chemotherapy at L2-L3 level without complication. Electronically Signed   By: Maurine Simmering M.D.   On: 05/23/2022 11:49   NM PET Image Restag (PS) Skull Base To Thigh  Result  Date: 05/10/2022 CLINICAL DATA:  Subsequent treatment strategy for anaplastic large-cell lymphoma. EXAM: NUCLEAR MEDICINE PET SKULL BASE TO THIGH TECHNIQUE: 8.14 mCi F-18 FDG was injected intravenously. Full-ring PET imaging was performed from the skull base to thigh after the radiotracer. CT data was obtained and used for attenuation correction and anatomic localization. Fasting blood glucose: 89 mg/dl COMPARISON:  Numerous prior PET CTs. Most recent study is 03/14/2022 FINDINGS: Mediastinal blood pool activity: SUV max 1.43 Liver activity: SUV max 2.72 NECK: No hypermetabolic  lymph nodes in the neck. Incidental CT findings: None. CHEST: The medial left breast lesion is much improved. It previously measured 2.9 x 2.9 cm and now measures 2.0 x 1.9 cm. SUV max was 34.34 and is now 2.17. No enlarged or hypermetabolic axillary or supraclavicular nodes. The small right supraclavicular node seen on the prior study has resolved. No mediastinal or hilar lymphadenopathy. No pulmonary lesions or worrisome pulmonary nodules. Small perifissural nodule adjacent to the minor fissure measures 4 mm and is stable. This is likely benign perifissural lymph node. No hypermetabolism. 5 mm nodule in the right upper lobe on image number 85/2. This previously measured 8 mm. Incidental CT findings: The right IJ Port-A-Cath is in good position. No pleural or pericardial effusion. Stable small hiatal hernia. ABDOMEN/PELVIS: The hypermetabolic lesion in the pancreatic body/tail junction region on the prior PET-CT has resolved. No hepatic or adrenal gland lesions. No enlarged or hypermetabolic abdominal or pelvic lymph nodes. No inguinal adenopathy. Incidental CT findings: None. SKELETON: Diffuse marrow hypermetabolism consistent with chemotherapy response or marrow stimulating drugs. Incidental CT findings: None. IMPRESSION: 1. Findings suggest a good response to treatment. The left medial breast lesion is smaller and significantly less  hypermetabolic. The right supraclavicular node has resolved and the pancreatic lesion has resolved. 2. No new disease is demonstrated. 3. Diffuse marrow hypermetabolism likely chemotherapy effect or due to marrow stimulating drugs. Electronically Signed   By: Marijo Sanes M.D.   On: 05/10/2022 10:59

## 2022-06-07 ENCOUNTER — Inpatient Hospital Stay: Payer: BC Managed Care – PPO

## 2022-06-07 VITALS — BP 119/80 | HR 90 | Temp 98.4°F

## 2022-06-07 DIAGNOSIS — C8468 Anaplastic large cell lymphoma, ALK-positive, lymph nodes of multiple sites: Secondary | ICD-10-CM | POA: Diagnosis not present

## 2022-06-07 DIAGNOSIS — C8464 Anaplastic large cell lymphoma, ALK-positive, lymph nodes of axilla and upper limb: Secondary | ICD-10-CM

## 2022-06-07 MED ORDER — SODIUM CHLORIDE 0.9 % IV SOLN
Freq: Once | INTRAVENOUS | Status: AC
  Filled 2022-06-07: qty 250

## 2022-06-07 MED ORDER — SODIUM CHLORIDE 0.9 % IV SOLN
400.0000 mg/m2 | Freq: Once | INTRAVENOUS | Status: AC
  Administered 2022-06-07: 700 mg via INTRAVENOUS
  Filled 2022-06-07: qty 7

## 2022-06-07 MED ORDER — SODIUM CHLORIDE 0.9 % IV SOLN
10.0000 mg | Freq: Once | INTRAVENOUS | Status: AC
  Administered 2022-06-07: 10 mg via INTRAVENOUS
  Filled 2022-06-07: qty 10

## 2022-06-07 MED ORDER — LORAZEPAM 2 MG/ML IJ SOLN
0.5000 mg | Freq: Once | INTRAMUSCULAR | Status: AC | PRN
  Administered 2022-06-07: 0.5 mg via INTRAVENOUS
  Filled 2022-06-07: qty 1

## 2022-06-07 MED ORDER — PALONOSETRON HCL INJECTION 0.25 MG/5ML
0.2500 mg | Freq: Once | INTRAVENOUS | Status: AC
  Administered 2022-06-07: 0.25 mg via INTRAVENOUS
  Filled 2022-06-07: qty 5

## 2022-06-07 MED ORDER — SODIUM CHLORIDE 0.9 % IV SOLN
Freq: Once | INTRAVENOUS | Status: AC
  Filled 2022-06-07: qty 54

## 2022-06-07 MED ORDER — HYDROMORPHONE HCL 1 MG/ML IJ SOLN: 0.2500 mg | INTRAMUSCULAR | Status: DC | PRN

## 2022-06-07 MED ORDER — SODIUM CHLORIDE 0.9 % IV SOLN
100.0000 mg/m2 | Freq: Once | INTRAVENOUS | Status: AC
  Administered 2022-06-07: 180 mg via INTRAVENOUS
  Filled 2022-06-07: qty 9

## 2022-06-07 MED FILL — Fosaprepitant Dimeglumine For IV Infusion 150 MG (Base Eq): INTRAVENOUS | Qty: 5 | Status: AC

## 2022-06-07 MED FILL — Dexamethasone Sodium Phosphate Inj 100 MG/10ML: INTRAMUSCULAR | Qty: 1 | Status: AC

## 2022-06-08 ENCOUNTER — Inpatient Hospital Stay: Payer: BC Managed Care – PPO

## 2022-06-08 ENCOUNTER — Other Ambulatory Visit: Payer: Self-pay

## 2022-06-08 ENCOUNTER — Encounter: Payer: Self-pay | Admitting: Oncology

## 2022-06-08 DIAGNOSIS — C8464 Anaplastic large cell lymphoma, ALK-positive, lymph nodes of axilla and upper limb: Secondary | ICD-10-CM

## 2022-06-08 DIAGNOSIS — R300 Dysuria: Secondary | ICD-10-CM

## 2022-06-08 DIAGNOSIS — C8468 Anaplastic large cell lymphoma, ALK-positive, lymph nodes of multiple sites: Secondary | ICD-10-CM | POA: Diagnosis not present

## 2022-06-08 LAB — CBC WITH DIFFERENTIAL/PLATELET
Abs Immature Granulocytes: 0.4 10*3/uL — ABNORMAL HIGH (ref 0.00–0.07)
Basophils Absolute: 0 10*3/uL (ref 0.0–0.1)
Basophils Relative: 0 %
Eosinophils Absolute: 0 10*3/uL (ref 0.0–0.5)
Eosinophils Relative: 0 %
HCT: 23.5 % — ABNORMAL LOW (ref 36.0–46.0)
Hemoglobin: 7.6 g/dL — ABNORMAL LOW (ref 12.0–15.0)
Immature Granulocytes: 5 %
Lymphocytes Relative: 17 %
Lymphs Abs: 1.5 10*3/uL (ref 0.7–4.0)
MCH: 32.2 pg (ref 26.0–34.0)
MCHC: 32.3 g/dL (ref 30.0–36.0)
MCV: 99.6 fL (ref 80.0–100.0)
Monocytes Absolute: 0.8 10*3/uL (ref 0.1–1.0)
Monocytes Relative: 9 %
Neutro Abs: 6.2 10*3/uL (ref 1.7–7.7)
Neutrophils Relative %: 69 %
Platelets: 290 10*3/uL (ref 150–400)
RBC: 2.36 MIL/uL — ABNORMAL LOW (ref 3.87–5.11)
RDW: 21.8 % — ABNORMAL HIGH (ref 11.5–15.5)
WBC: 8.9 10*3/uL (ref 4.0–10.5)
nRBC: 0.3 % — ABNORMAL HIGH (ref 0.0–0.2)

## 2022-06-08 LAB — URINALYSIS, COMPLETE (UACMP) WITH MICROSCOPIC
Bilirubin Urine: NEGATIVE
Glucose, UA: 50 mg/dL — AB
Hgb urine dipstick: NEGATIVE
Ketones, ur: 5 mg/dL — AB
Nitrite: NEGATIVE
Protein, ur: 100 mg/dL — AB
Specific Gravity, Urine: 1.018 (ref 1.005–1.030)
WBC, UA: >50 WBC/hpf (ref 0–5)
pH: 5 (ref 5.0–8.0)

## 2022-06-08 MED ORDER — SODIUM CHLORIDE 0.9 % IV SOLN
Freq: Once | INTRAVENOUS | Status: AC
  Filled 2022-06-08: qty 54

## 2022-06-08 MED ORDER — HEPARIN SOD (PORK) LOCK FLUSH 100 UNIT/ML IV SOLN
500.0000 [IU] | Freq: Once | INTRAVENOUS | Status: AC | PRN
  Administered 2022-06-08: 500 [IU]
  Filled 2022-06-08: qty 5

## 2022-06-08 MED ORDER — HYDROMORPHONE HCL 1 MG/ML IJ SOLN: 0.2500 mg | INTRAMUSCULAR | Status: DC | PRN

## 2022-06-08 MED ORDER — SODIUM CHLORIDE 0.9 % IV SOLN
400.0000 mg/m2 | Freq: Once | INTRAVENOUS | Status: AC
  Administered 2022-06-08: 700 mg via INTRAVENOUS
  Filled 2022-06-08: qty 7

## 2022-06-08 MED ORDER — SODIUM CHLORIDE 0.9 % IV SOLN
100.0000 mg/m2 | Freq: Once | INTRAVENOUS | Status: AC
  Administered 2022-06-08: 180 mg via INTRAVENOUS
  Filled 2022-06-08: qty 9

## 2022-06-08 MED ORDER — SODIUM CHLORIDE 0.9 % IV SOLN
10.0000 mg | Freq: Once | INTRAVENOUS | Status: AC
  Administered 2022-06-08: 10 mg via INTRAVENOUS
  Filled 2022-06-08: qty 10

## 2022-06-08 MED ORDER — SODIUM CHLORIDE 0.9 % IV SOLN
150.0000 mg | Freq: Once | INTRAVENOUS | Status: AC
  Administered 2022-06-08: 150 mg via INTRAVENOUS
  Filled 2022-06-08: qty 150

## 2022-06-08 MED ORDER — SODIUM CHLORIDE 0.9 % IV SOLN
Freq: Once | INTRAVENOUS | Status: AC
  Filled 2022-06-08: qty 250

## 2022-06-08 MED ORDER — LORAZEPAM 2 MG/ML IJ SOLN
0.5000 mg | Freq: Once | INTRAMUSCULAR | Status: AC | PRN
  Administered 2022-06-08: 0.5 mg via INTRAVENOUS
  Filled 2022-06-08: qty 1

## 2022-06-08 MED ORDER — SODIUM CHLORIDE 0.9 % IV SOLN
724.0000 mg | Freq: Once | INTRAVENOUS | Status: AC
  Administered 2022-06-08: 700 mg via INTRAVENOUS
  Filled 2022-06-08: qty 70

## 2022-06-08 MED FILL — Dexamethasone Sodium Phosphate Inj 100 MG/10ML: INTRAMUSCULAR | Qty: 1 | Status: AC

## 2022-06-08 NOTE — Patient Instructions (Signed)
[facility]  Discharge Instructions: Thank you for choosing [facility] to provide your oncology and hematology care.  If you have a lab appointment with the [facility], please go directly to the [facility] and check in at the registration area.  Wear comfortable clothing and clothing appropriate for easy access to any Portacath or PICC line.   We strive to give you quality time with your provider. You may need to reschedule your appointment if you arrive late (15 or more minutes).  Arriving late affects you and other patients whose appointments are after yours.  Also, if you miss three or more appointments without notifying the office, you may be dismissed from the clinic at the provider's discretion.      For prescription refill requests, have your pharmacy contact our office and allow 72 hours for refills to be completed.    Today you received the following chemotherapy and/or immunotherapy agents Etoposide, Carboplatin, Mesna, IFEX      To help prevent nausea and vomiting after your treatment, we encourage you to take your nausea medication as directed.  BELOW ARE SYMPTOMS THAT SHOULD BE REPORTED IMMEDIATELY: *FEVER GREATER THAN 100.4 F (38 C) OR HIGHER *CHILLS OR SWEATING *NAUSEA AND VOMITING THAT IS NOT CONTROLLED WITH YOUR NAUSEA MEDICATION *UNUSUAL SHORTNESS OF BREATH *UNUSUAL BRUISING OR BLEEDING *URINARY PROBLEMS (pain or burning when urinating, or frequent urination) *BOWEL PROBLEMS (unusual diarrhea, constipation, pain near the anus) TENDERNESS IN MOUTH AND THROAT WITH OR WITHOUT PRESENCE OF ULCERS (sore throat, sores in mouth, or a toothache) UNUSUAL RASH, SWELLING OR PAIN  UNUSUAL VAGINAL DISCHARGE OR ITCHING   Items with * indicate a potential emergency and should be followed up as soon as possible or go to the Emergency Department if any problems should occur.  Please show the CHEMOTHERAPY ALERT CARD or IMMUNOTHERAPY  ALERT CARD at check-in to the Emergency Department and triage nurse.  Should you have questions after your visit or need to cancel or reschedule your appointment, please contact [facility]  [phone] and follow the prompts.  Office hours are 8:00 a.m. to 4:30 p.m. Monday - Friday. Please note that voicemails left after 4:00 p.m. may not be returned until the following business day.  We are closed weekends and major holidays. You have access to a nurse at all times for urgent questions. Please call the main number to the clinic [phone] and follow the prompts.  For any non-urgent questions, you may also contact your provider using MyChart. We now offer e-Visits for anyone 47 and older to request care online for non-urgent symptoms. For details visit [email or web address].   Also download the MyChart app! Go to the app store, search "MyChart", open the app, select [facility], and log in with your MyChart username and password.

## 2022-06-08 NOTE — Progress Notes (Signed)
1610: Per Janeann Merl RN per Dr. Tasia Catchings okay to proceed with Pre-meds while waiting for CBC and UA results.  9604: Hemoglobin 7.6, Per Benjamine Mola RN per Dr. Tasia Catchings okay to proceed with treatment as scheduled.   1018: Per Dr. Tasia Catchings okay to start Etoposide while waiting for UA results.  1100: UA results reviewed with Dr. Tasia Catchings,  Ketone: 5 Protein: 100 Leukocytes: Moderate Bacteria: Many Pt complains that since starting treatment, intermittent burning with urination  and frequency has occurred.   Per Dr. Tasia Catchings proceed with treatment, Urine culture to be collected and sent to lab. And pt to increase oral hydration. Pt aware and verbalizes understanding.   1709: Per Dr. Rogue Bussing okay to increase post Ifex IVFs to 999 ml/hr.    1730: Pt stable at discharge.

## 2022-06-09 ENCOUNTER — Inpatient Hospital Stay: Payer: BC Managed Care – PPO

## 2022-06-09 ENCOUNTER — Other Ambulatory Visit: Payer: Self-pay | Admitting: Oncology

## 2022-06-09 VITALS — BP 113/60 | HR 85 | Temp 96.6°F | Resp 18

## 2022-06-09 DIAGNOSIS — C8468 Anaplastic large cell lymphoma, ALK-positive, lymph nodes of multiple sites: Secondary | ICD-10-CM | POA: Diagnosis not present

## 2022-06-09 DIAGNOSIS — C8464 Anaplastic large cell lymphoma, ALK-positive, lymph nodes of axilla and upper limb: Secondary | ICD-10-CM

## 2022-06-09 LAB — CBC WITH DIFFERENTIAL/PLATELET
Abs Immature Granulocytes: 0.06 10*3/uL (ref 0.00–0.07)
Basophils Absolute: 0 10*3/uL (ref 0.0–0.1)
Basophils Relative: 0 %
Eosinophils Absolute: 0 10*3/uL (ref 0.0–0.5)
Eosinophils Relative: 0 %
HCT: 22.1 % — ABNORMAL LOW (ref 36.0–46.0)
Hemoglobin: 7.3 g/dL — ABNORMAL LOW (ref 12.0–15.0)
Immature Granulocytes: 1 %
Lymphocytes Relative: 23 %
Lymphs Abs: 1.3 10*3/uL (ref 0.7–4.0)
MCH: 32.7 pg (ref 26.0–34.0)
MCHC: 33 g/dL (ref 30.0–36.0)
MCV: 99.1 fL (ref 80.0–100.0)
Monocytes Absolute: 0.5 10*3/uL (ref 0.1–1.0)
Monocytes Relative: 9 %
Neutro Abs: 3.7 10*3/uL (ref 1.7–7.7)
Neutrophils Relative %: 67 %
Platelets: 314 10*3/uL (ref 150–400)
RBC: 2.23 MIL/uL — ABNORMAL LOW (ref 3.87–5.11)
RDW: 22.8 % — ABNORMAL HIGH (ref 11.5–15.5)
WBC: 5.5 10*3/uL (ref 4.0–10.5)
nRBC: 0.4 % — ABNORMAL HIGH (ref 0.0–0.2)

## 2022-06-09 LAB — URINALYSIS, COMPLETE (UACMP) WITH MICROSCOPIC
Bilirubin Urine: NEGATIVE
Glucose, UA: 50 mg/dL — AB
Hgb urine dipstick: NEGATIVE
Ketones, ur: NEGATIVE mg/dL
Leukocytes,Ua: NEGATIVE
Nitrite: NEGATIVE
Protein, ur: NEGATIVE mg/dL
Specific Gravity, Urine: 1.009 (ref 1.005–1.030)
pH: 7 (ref 5.0–8.0)

## 2022-06-09 MED ORDER — SODIUM CHLORIDE 0.9 % IV SOLN
400.0000 mg/m2 | Freq: Once | INTRAVENOUS | Status: AC
  Administered 2022-06-09: 700 mg via INTRAVENOUS
  Filled 2022-06-09: qty 7

## 2022-06-09 MED ORDER — SODIUM CHLORIDE 0.9 % IV SOLN
10.0000 mg | Freq: Once | INTRAVENOUS | Status: AC
  Administered 2022-06-09: 10 mg via INTRAVENOUS
  Filled 2022-06-09: qty 10

## 2022-06-09 MED ORDER — PALONOSETRON HCL INJECTION 0.25 MG/5ML
0.2500 mg | Freq: Once | INTRAVENOUS | Status: AC
  Administered 2022-06-09: 0.25 mg via INTRAVENOUS
  Filled 2022-06-09: qty 5

## 2022-06-09 MED ORDER — LORAZEPAM 2 MG/ML IJ SOLN
0.5000 mg | Freq: Once | INTRAMUSCULAR | Status: AC | PRN
  Administered 2022-06-09: 0.5 mg via INTRAVENOUS
  Filled 2022-06-09: qty 1

## 2022-06-09 MED ORDER — SODIUM CHLORIDE 0.9 % IV SOLN
100.0000 mg/m2 | Freq: Once | INTRAVENOUS | Status: AC
  Administered 2022-06-09: 180 mg via INTRAVENOUS
  Filled 2022-06-09: qty 9

## 2022-06-09 MED ORDER — HEPARIN SOD (PORK) LOCK FLUSH 100 UNIT/ML IV SOLN
500.0000 [IU] | Freq: Once | INTRAVENOUS | Status: AC | PRN
  Administered 2022-06-09: 500 [IU]
  Filled 2022-06-09: qty 5

## 2022-06-09 MED ORDER — SODIUM CHLORIDE 0.9 % IV SOLN
Freq: Once | INTRAVENOUS | Status: AC
  Filled 2022-06-09: qty 250

## 2022-06-09 MED ORDER — SODIUM CHLORIDE 0.9 % IV SOLN
Freq: Once | INTRAVENOUS | Status: AC
  Filled 2022-06-09: qty 54

## 2022-06-10 LAB — URINE CULTURE: Culture: NO GROWTH

## 2022-06-12 ENCOUNTER — Inpatient Hospital Stay: Payer: BC Managed Care – PPO

## 2022-06-12 ENCOUNTER — Other Ambulatory Visit: Payer: Self-pay

## 2022-06-12 ENCOUNTER — Encounter: Payer: Self-pay | Admitting: Oncology

## 2022-06-12 ENCOUNTER — Telehealth: Payer: Self-pay

## 2022-06-12 VITALS — BP 111/70 | HR 80 | Temp 98.9°F | Resp 17

## 2022-06-12 DIAGNOSIS — C8464 Anaplastic large cell lymphoma, ALK-positive, lymph nodes of axilla and upper limb: Secondary | ICD-10-CM

## 2022-06-12 DIAGNOSIS — C8468 Anaplastic large cell lymphoma, ALK-positive, lymph nodes of multiple sites: Secondary | ICD-10-CM | POA: Diagnosis not present

## 2022-06-12 LAB — CBC WITH DIFFERENTIAL/PLATELET
Abs Immature Granulocytes: 0.01 10*3/uL (ref 0.00–0.07)
Basophils Absolute: 0 10*3/uL (ref 0.0–0.1)
Basophils Relative: 1 %
Eosinophils Absolute: 0 10*3/uL (ref 0.0–0.5)
Eosinophils Relative: 1 %
HCT: 22.8 % — ABNORMAL LOW (ref 36.0–46.0)
Hemoglobin: 7.7 g/dL — ABNORMAL LOW (ref 12.0–15.0)
Immature Granulocytes: 1 %
Lymphocytes Relative: 32 %
Lymphs Abs: 0.6 10*3/uL — ABNORMAL LOW (ref 0.7–4.0)
MCH: 32.8 pg (ref 26.0–34.0)
MCHC: 33.8 g/dL (ref 30.0–36.0)
MCV: 97 fL (ref 80.0–100.0)
Monocytes Absolute: 0.1 10*3/uL (ref 0.1–1.0)
Monocytes Relative: 3 %
Neutro Abs: 1.1 10*3/uL — ABNORMAL LOW (ref 1.7–7.7)
Neutrophils Relative %: 62 %
Platelets: 388 10*3/uL (ref 150–400)
RBC: 2.35 MIL/uL — ABNORMAL LOW (ref 3.87–5.11)
RDW: 21.2 % — ABNORMAL HIGH (ref 11.5–15.5)
WBC: 1.8 10*3/uL — ABNORMAL LOW (ref 4.0–10.5)
nRBC: 0 % (ref 0.0–0.2)

## 2022-06-12 MED ORDER — HEPARIN SOD (PORK) LOCK FLUSH 100 UNIT/ML IV SOLN
500.0000 [IU] | Freq: Once | INTRAVENOUS | Status: AC
  Administered 2022-06-12: 500 [IU] via INTRAVENOUS
  Filled 2022-06-12: qty 5

## 2022-06-12 MED ORDER — SODIUM CHLORIDE 0.9 % IV SOLN
Freq: Once | INTRAVENOUS | Status: AC
  Filled 2022-06-12: qty 250

## 2022-06-12 MED ORDER — SODIUM CHLORIDE 0.9% FLUSH
10.0000 mL | Freq: Once | INTRAVENOUS | Status: AC
  Administered 2022-06-12: 10 mL via INTRAVENOUS
  Filled 2022-06-12: qty 10

## 2022-06-12 MED ORDER — PEGFILGRASTIM-CBQV 6 MG/0.6ML ~~LOC~~ SOSY
6.0000 mg | PREFILLED_SYRINGE | Freq: Once | SUBCUTANEOUS | Status: AC
  Administered 2022-06-12: 6 mg via SUBCUTANEOUS
  Filled 2022-06-12: qty 0.6

## 2022-06-12 NOTE — Telephone Encounter (Signed)
Patient will not need blood on 2/14. please cancel.   Please schedule labs next week (cbc, hold tube) -D1 Possible PRBC- D2

## 2022-06-12 NOTE — Progress Notes (Signed)
Patient for [facility] Methotrexate Inj on Tues 06/13/2022, I called and spoke with the patient on the phone and gave pre-procedure instructions. Pt was made aware to be here at [facility] at the new entrance. Pt stated understanding. Called 06/12/2022

## 2022-06-12 NOTE — Patient Instructions (Signed)
[facility]  Discharge Instructions: Thank you for choosing [facility] to provide your oncology and hematology care.  If you have a lab appointment with the [facility], please go directly to the [facility] and check in at the registration area.  Wear comfortable clothing and clothing appropriate for easy access to any Portacath or PICC line.   We strive to give you quality time with your provider. You may need to reschedule your appointment if you arrive late (15 or more minutes).  Arriving late affects you and other patients whose appointments are after yours.  Also, if you miss three or more appointments without notifying the office, you may be dismissed from the clinic at the provider's discretion.      For prescription refill requests, have your pharmacy contact our office and allow 72 hours for refills to be completed.    Today you received IV fluids  To help prevent nausea and vomiting after your treatment, we encourage you to take your nausea medication as directed.  BELOW ARE SYMPTOMS THAT SHOULD BE REPORTED IMMEDIATELY: *FEVER GREATER THAN 100.4 F (38 C) OR HIGHER *CHILLS OR SWEATING *NAUSEA AND VOMITING THAT IS NOT CONTROLLED WITH YOUR NAUSEA MEDICATION *UNUSUAL SHORTNESS OF BREATH *UNUSUAL BRUISING OR BLEEDING *URINARY PROBLEMS (pain or burning when urinating, or frequent urination) *BOWEL PROBLEMS (unusual diarrhea, constipation, pain near the anus) TENDERNESS IN MOUTH AND THROAT WITH OR WITHOUT PRESENCE OF ULCERS (sore throat, sores in mouth, or a toothache) UNUSUAL RASH, SWELLING OR PAIN  UNUSUAL VAGINAL DISCHARGE OR ITCHING   Items with * indicate a potential emergency and should be followed up as soon as possible or go to the Emergency Department if any problems should occur.  Please show the CHEMOTHERAPY ALERT CARD or IMMUNOTHERAPY ALERT CARD at check-in to the Emergency Department and triage nurse.  Should you  have questions after your visit or need to cancel or reschedule your appointment, please contact [facility]  [phone] and follow the prompts.  Office hours are 8:00 a.m. to 4:30 p.m. Monday - Friday. Please note that voicemails left after 4:00 p.m. may not be returned until the following business day.  We are closed weekends and major holidays. You have access to a nurse at all times for urgent questions. Please call the main number to the clinic [phone] and follow the prompts.  For any non-urgent questions, you may also contact your provider using MyChart. We now offer e-Visits for anyone 32 and older to request care online for non-urgent symptoms. For details visit [email or web address].   Also download the MyChart app! Go to the app store, search "MyChart", open the app, select [facility], and log in with your MyChart username and password.

## 2022-06-12 NOTE — Telephone Encounter (Signed)
Please cancel PET scan on 2/21.

## 2022-06-13 ENCOUNTER — Ambulatory Visit
Admission: RE | Admit: 2022-06-13 | Discharge: 2022-06-13 | Disposition: A | Discharge status: Home / Self Care | Payer: BC Managed Care – PPO | Source: Ambulatory Visit | Attending: Oncology | Admitting: Oncology

## 2022-06-13 ENCOUNTER — Other Ambulatory Visit: Payer: Self-pay

## 2022-06-13 ENCOUNTER — Encounter: Payer: Self-pay | Admitting: Oncology

## 2022-06-13 ENCOUNTER — Ambulatory Visit: Payer: BC Managed Care – PPO

## 2022-06-13 VITALS — BP 93/50 | HR 88 | Temp 98.4°F | Resp 18 | Ht 62.0 in | Wt 160.0 lb

## 2022-06-13 DIAGNOSIS — C8464 Anaplastic large cell lymphoma, ALK-positive, lymph nodes of axilla and upper limb: Secondary | ICD-10-CM | POA: Diagnosis present

## 2022-06-13 MED ORDER — SODIUM CHLORIDE (PF) 0.9 % IJ SOLN
Freq: Once | INTRAMUSCULAR | Status: AC
  Filled 2022-06-13: qty 0.48

## 2022-06-13 MED ORDER — LIDOCAINE HCL (PF) 1 % IJ SOLN
30.0000 mL | Freq: Once | INTRAMUSCULAR | Status: AC
  Administered 2022-06-13: 30 mL

## 2022-06-13 NOTE — Discharge Instructions (Signed)
Lumbar Puncture, Care After Refer to this sheet in the next few weeks. These instructions provide you with information on caring for yourself after your procedure. Your health care provider may also give you more specific instructions. Your treatment has been planned according to current medical practices, but problems sometimes occur. Call your health care provider if you have any problems or questions after your procedure. What can I expect after the procedure? After your procedure, it is typical to have the following sensations: Mild discomfort or pain at the insertion site. Mild headache that is relieved with pain medicines.  Follow these instructions at home:  Avoid lifting anything heavier than 10 lb (4.5 kg) for at least 12 hours after the procedure. Drink enough fluids to keep your urine clear or pale yellow. Lay flat or as flat as possible for the remainder of the day. Contact a health care provider if: You have fever or chills. You have nausea or vomiting. You have a headache that lasts for more than 2 days. Get help right away if: You have any numbness or tingling in your legs. You are unable to control your bowel or bladder. You have bleeding or swelling in your back at the insertion site. You are dizzy or faint. This information is not intended to replace advice given to you by your health care provider. Make sure you discuss any questions you have with your health care provider. Document Released: 04/22/2013 Document Revised: 09/23/2015 Document Reviewed: 12/24/2012 Elsevier Interactive Patient Education  2017 [employer].

## 2022-06-13 NOTE — Procedures (Signed)
Technically successful L4-L5 lumbar puncture for methotrexate injection. Please see full dictation under the imaging tab in Epic.  Soyla Dryer, [facility] [phone] 06/13/2022, 10:13 AM

## 2022-06-14 ENCOUNTER — Inpatient Hospital Stay: Payer: BC Managed Care – PPO

## 2022-06-15 LAB — SAMPLE TO BLOOD BANK

## 2022-06-20 ENCOUNTER — Inpatient Hospital Stay: Payer: BC Managed Care – PPO

## 2022-06-20 ENCOUNTER — Other Ambulatory Visit: Payer: Self-pay | Admitting: Oncology

## 2022-06-20 ENCOUNTER — Other Ambulatory Visit: Payer: Self-pay

## 2022-06-20 ENCOUNTER — Telehealth: Payer: Self-pay

## 2022-06-20 DIAGNOSIS — C8464 Anaplastic large cell lymphoma, ALK-positive, lymph nodes of axilla and upper limb: Secondary | ICD-10-CM

## 2022-06-20 DIAGNOSIS — C8468 Anaplastic large cell lymphoma, ALK-positive, lymph nodes of multiple sites: Secondary | ICD-10-CM | POA: Diagnosis not present

## 2022-06-20 DIAGNOSIS — D649 Anemia, unspecified: Secondary | ICD-10-CM

## 2022-06-20 LAB — COMPREHENSIVE METABOLIC PANEL
ALT: 41 U/L (ref 0–44)
AST: 26 U/L (ref 15–41)
Albumin: 4 g/dL (ref 3.5–5.0)
Alkaline Phosphatase: 68 U/L (ref 38–126)
Anion gap: 10 (ref 5–15)
BUN: 10 mg/dL (ref 6–20)
CO2: 24 mmol/L (ref 22–32)
Calcium: 9.2 mg/dL (ref 8.9–10.3)
Chloride: 106 mmol/L (ref 98–111)
Creatinine, Ser: 0.66 mg/dL (ref 0.44–1.00)
GFR, Estimated: >60 mL/min (ref >60)
Glucose, Bld: 97 mg/dL (ref 70–99)
Potassium: 4.2 mmol/L (ref 3.5–5.1)
Sodium: 140 mmol/L (ref 135–145)
Total Bilirubin: 0.5 mg/dL (ref 0.3–1.2)
Total Protein: 7.2 g/dL (ref 6.5–8.1)

## 2022-06-20 LAB — CBC WITH DIFFERENTIAL/PLATELET
Abs Immature Granulocytes: 0.31 10*3/uL — ABNORMAL HIGH (ref 0.00–0.07)
Basophils Absolute: 0.1 10*3/uL (ref 0.0–0.1)
Basophils Relative: 1 %
Eosinophils Absolute: 0 10*3/uL (ref 0.0–0.5)
Eosinophils Relative: 0 %
HCT: 20.2 % — ABNORMAL LOW (ref 36.0–46.0)
Hemoglobin: 6.5 g/dL — CL (ref 12.0–15.0)
Immature Granulocytes: 6 %
Lymphocytes Relative: 21 %
Lymphs Abs: 1.2 10*3/uL (ref 0.7–4.0)
MCH: 33.3 pg (ref 26.0–34.0)
MCHC: 32.2 g/dL (ref 30.0–36.0)
MCV: 103.6 fL — ABNORMAL HIGH (ref 80.0–100.0)
Monocytes Absolute: 0.9 10*3/uL (ref 0.1–1.0)
Monocytes Relative: 16 %
Neutro Abs: 3 10*3/uL (ref 1.7–7.7)
Neutrophils Relative %: 56 %
Platelets: 45 10*3/uL — ABNORMAL LOW (ref 150–400)
RBC: 1.95 MIL/uL — ABNORMAL LOW (ref 3.87–5.11)
RDW: 22.4 % — ABNORMAL HIGH (ref 11.5–15.5)
Smear Review: NORMAL
WBC: 5.4 10*3/uL (ref 4.0–10.5)
nRBC: 1.7 % — ABNORMAL HIGH (ref 0.0–0.2)

## 2022-06-20 LAB — FOLATE: Folate: 7.3 ng/mL (ref >5.9)

## 2022-06-20 LAB — FERRITIN: Ferritin: 317 ng/mL — ABNORMAL HIGH (ref 11–307)

## 2022-06-20 LAB — IRON AND TIBC
Iron: 162 ug/dL (ref 28–170)
Saturation Ratios: 55 % — ABNORMAL HIGH (ref 10.4–31.8)
TIBC: 297 ug/dL (ref 250–450)
UIBC: 135 ug/dL

## 2022-06-20 LAB — SAMPLE TO BLOOD BANK

## 2022-06-20 LAB — LACTATE DEHYDROGENASE: LDH: 160 U/L (ref 98–192)

## 2022-06-20 NOTE — Telephone Encounter (Signed)
Attempt made to move Blood transufusion to tomorrow 2/21, but infusion and [facility] unable to accomodate. Called and informed pt that she will need to go to ER, but she states that she would rather not go. She states she has been feeling good and energetic today and she promises not to exert herself tomorrow. Per MD, ok to keep Blood transfusion on Thurs as scheduled. Informed pt that if she starts feeling fatigued, shortness of breath or passing out to go to ER. Pt verblized understanding. Pt also informed that she will be sheduled for a lab and poss blood next week.   Please schedule labs on 2/26 and poss blood on 2/27. Pt will see apts on Mychart.

## 2022-06-21 ENCOUNTER — Other Ambulatory Visit: Payer: Self-pay

## 2022-06-21 ENCOUNTER — Encounter: Payer: Self-pay | Admitting: Oncology

## 2022-06-21 ENCOUNTER — Ambulatory Visit: Payer: BC Managed Care – PPO

## 2022-06-21 ENCOUNTER — Inpatient Hospital Stay: Payer: BC Managed Care – PPO

## 2022-06-21 DIAGNOSIS — D649 Anemia, unspecified: Secondary | ICD-10-CM

## 2022-06-21 LAB — PREPARE RBC (CROSSMATCH)

## 2022-06-22 ENCOUNTER — Inpatient Hospital Stay: Payer: BC Managed Care – PPO

## 2022-06-22 DIAGNOSIS — D649 Anemia, unspecified: Secondary | ICD-10-CM

## 2022-06-22 DIAGNOSIS — C8468 Anaplastic large cell lymphoma, ALK-positive, lymph nodes of multiple sites: Secondary | ICD-10-CM | POA: Diagnosis not present

## 2022-06-22 MED ORDER — ACETAMINOPHEN 325 MG PO TABS
650.0000 mg | ORAL_TABLET | Freq: Once | ORAL | Status: AC
  Administered 2022-06-22: 650 mg via ORAL
  Filled 2022-06-22: qty 2

## 2022-06-22 MED ORDER — SODIUM CHLORIDE 0.9% IV SOLUTION
250.0000 mL | Freq: Once | INTRAVENOUS | Status: AC
  Administered 2022-06-22: 250 mL via INTRAVENOUS
  Filled 2022-06-22: qty 250

## 2022-06-22 MED ORDER — DIPHENHYDRAMINE HCL 25 MG PO CAPS
25.0000 mg | ORAL_CAPSULE | Freq: Once | ORAL | Status: AC
  Administered 2022-06-22: 25 mg via ORAL
  Filled 2022-06-22: qty 1

## 2022-06-22 MED ORDER — HEPARIN SOD (PORK) LOCK FLUSH 100 UNIT/ML IV SOLN
500.0000 [IU] | Freq: Every day | INTRAVENOUS | Status: AC | PRN
  Filled 2022-06-22: qty 5

## 2022-06-22 MED ORDER — HEPARIN SOD (PORK) LOCK FLUSH 100 UNIT/ML IV SOLN
INTRAVENOUS | Status: AC
  Administered 2022-06-22: 500 [IU]
  Filled 2022-06-22: qty 5

## 2022-06-22 NOTE — Telephone Encounter (Signed)
Pt reported to infusion RN Amy, that she has an appt at [facility] on Wed and a PET scan at [facility] on [address].   per pt request move lab appt on 2/26 as late as possible (after 3)   Please cancel blood on thurs , if needed, we will contact [facility] to see if they can accomodate. Pt will view appts on Mychart.

## 2022-06-22 NOTE — Progress Notes (Signed)
Pt reports a "pulsating" in the back of her head when climbing the stairs post the most recent lumbar puncture. Pt states it lasted a few days and has resolved. Dr. Tasia Catchings and team made aware via secure chat.  Pt stable, no s/s of distress noted.

## 2022-06-22 NOTE — Patient Instructions (Signed)
Blood Transfusion, Adult A blood transfusion is a procedure in which you receive blood or a type of blood cell (blood component) through an IV. You may need a blood transfusion when you have a low blood count, which is a low number of any blood cell. This may result from a bleeding disorder, illness, injury, or surgery. The blood may come from a donor, or you may be able to have your own blood collected and stored (autologous blood donation) before a planned surgery. The blood given in a transfusion may be made up of different blood components. You may receive: Red blood cells. These carry oxygen to the cells in the body. Platelets. These help your blood to clot. Plasma. This is the liquid part of your blood. It carries proteins and other substances throughout the body. White blood cells. These help you fight infections. If you have hemophilia or another clotting disorder, you may also receive other types of blood products. Depending on the type of blood product, this procedure may take 1-4 hours to complete. Tell a health care provider about: Any bleeding problems you have. Any previous reactions you have had during a blood transfusion. Any allergies you have. All medicines you are taking, including vitamins, herbs, eye drops, creams, and over-the-counter medicines. Any surgeries you have had. Any medical conditions you have. Whether you are pregnant or may be pregnant. What are the risks? Talk with your health care provider about risks. The most common problems include: A mild allergic reaction, such as red, swollen areas of skin (hives) and itching. Fever or chills. This may be the body's response to new blood cells received. This may occur during or up to 4 hours after the transfusion. More serious problems may include: A serious allergic reaction that causes difficulty breathing or swelling around the face and lips. Transfusion-associated circulatory overload (TACO), or too much fluid in  the lungs. This may cause breathing problems. Transfusion-related acute lung injury (TRALI), which causes breathing difficulty and low oxygen in the blood. This can occur within hours of the transfusion or several days later. Iron overload. This can happen after receiving many blood transfusions over a period of time. Infection or virus being transmitted. This is rare because donated blood is carefully tested before it is given. Hemolytic transfusion reaction. This is rare. It happens when the body's defense system (immune system)tries to attack the new blood cells. Symptoms may include fever, chills, nausea, low blood pressure, and low back or chest pain. Transfusion-associated graft-versus-host disease (TAGVHD). This is rare. It happens when donated cells attack the body's healthy tissues. What happens before the procedure? You will have a blood test to check your blood type. This test is done to know what kind of blood your body will accept and to match it to the donor blood. If you are going to have a planned surgery, you may be able to do an autologous blood donation. This may be done in case you need to have a transfusion. You will have your temperature, blood pressure, and pulse checked before the transfusion. If you have had an allergic reaction to a transfusion in the past, you may be given medicine to help prevent a reaction. This medicine may be given to you by mouth (orally) or through an IV. What happens during the procedure?  An IV will be inserted into one of your veins. The bag of blood will be attached to your IV. The blood will then enter through your vein. Your temperature, blood pressure,   and pulse will be monitored during the transfusion. This monitoring is done to detect early signs of a transfusion reaction. Tell your nurse right away if you have any of these symptoms during the transfusion: Shortness of breath or trouble breathing. Chest or back pain. Fever or  chills. Itching or hives. If you have any signs or symptoms of a reaction, your transfusion will be stopped and you may be given medicine. When the transfusion is complete, your IV will be removed. Pressure may be applied to the IV site for a few minutes. A bandage (dressing)will be applied. The procedure may vary among health care providers and hospitals. What happens after the procedure? Your temperature, blood pressure, pulse, breathing rate, and blood oxygen level will be monitored until you leave the hospital or clinic. Your blood may be tested to see how you have responded to the transfusion. You may be warmed with fluids or blankets to maintain a normal body temperature. If you receive your blood transfusion in an outpatient setting, you will be told whom to contact to report any reactions. Where to find more information Visit the [employer]: redcross.org Summary A blood transfusion is a procedure in which you receive blood or a type of blood cell (blood component) through an IV. The blood given in a transfusion may be made up of different blood components. You may receive red blood cells, platelets, plasma, or white blood cells depending on the condition treated. Your temperature, blood pressure, and pulse will be monitored before, during, and after the transfusion. After the transfusion, your blood may be tested to see how your body has responded. This information is not intended to replace advice given to you by your health care provider. Make sure you discuss any questions you have with your health care provider. Document Revised: 07/15/2021 Document Reviewed: 07/15/2021 Elsevier Patient Education  2023 Elsevier Inc.  

## 2022-06-23 LAB — BPAM RBC
Blood Product Expiration Date: 202403152359
ISSUE DATE / TIME: 202402220932
Unit Type and Rh: 5100

## 2022-06-23 LAB — TYPE AND SCREEN
ABO/RH(D): O POS
Antibody Screen: NEGATIVE
Unit division: 0

## 2022-06-26 ENCOUNTER — Inpatient Hospital Stay: Payer: BC Managed Care – PPO

## 2022-06-26 DIAGNOSIS — C8468 Anaplastic large cell lymphoma, ALK-positive, lymph nodes of multiple sites: Secondary | ICD-10-CM | POA: Diagnosis not present

## 2022-06-26 DIAGNOSIS — C8464 Anaplastic large cell lymphoma, ALK-positive, lymph nodes of axilla and upper limb: Secondary | ICD-10-CM

## 2022-06-26 LAB — CBC WITH DIFFERENTIAL (CANCER CENTER ONLY)
Abs Immature Granulocytes: 0.74 10*3/uL — ABNORMAL HIGH (ref 0.00–0.07)
Basophils Absolute: 0.1 10*3/uL (ref 0.0–0.1)
Basophils Relative: 1 %
Eosinophils Absolute: 0 10*3/uL (ref 0.0–0.5)
Eosinophils Relative: 0 %
HCT: 30.5 % — ABNORMAL LOW (ref 36.0–46.0)
Hemoglobin: 9.4 g/dL — ABNORMAL LOW (ref 12.0–15.0)
Immature Granulocytes: 8 %
Lymphocytes Relative: 16 %
Lymphs Abs: 1.5 10*3/uL (ref 0.7–4.0)
MCH: 32.4 pg (ref 26.0–34.0)
MCHC: 30.8 g/dL (ref 30.0–36.0)
MCV: 105.2 fL — ABNORMAL HIGH (ref 80.0–100.0)
Monocytes Absolute: 1.2 10*3/uL — ABNORMAL HIGH (ref 0.1–1.0)
Monocytes Relative: 13 %
Neutro Abs: 6.2 10*3/uL (ref 1.7–7.7)
Neutrophils Relative %: 62 %
Platelet Count: 222 10*3/uL (ref 150–400)
RBC: 2.9 MIL/uL — ABNORMAL LOW (ref 3.87–5.11)
RDW: 24.9 % — ABNORMAL HIGH (ref 11.5–15.5)
WBC Count: 9.8 10*3/uL (ref 4.0–10.5)
nRBC: 0.7 % — ABNORMAL HIGH (ref 0.0–0.2)

## 2022-06-26 LAB — SAMPLE TO BLOOD BANK

## 2022-06-26 LAB — VITAMIN B12: Vitamin B-12: 3868 pg/mL — ABNORMAL HIGH (ref 180–914)

## 2022-06-28 ENCOUNTER — Other Ambulatory Visit: Payer: BC Managed Care – PPO

## 2022-06-29 ENCOUNTER — Ambulatory Visit: Payer: BC Managed Care – PPO

## 2022-08-23 ENCOUNTER — Encounter: Payer: Self-pay | Admitting: Oncology

## 2022-08-23 ENCOUNTER — Inpatient Hospital Stay (HOSPITAL_BASED_OUTPATIENT_CLINIC_OR_DEPARTMENT_OTHER): Payer: BC Managed Care – PPO | Admitting: Oncology

## 2022-08-23 ENCOUNTER — Inpatient Hospital Stay: Payer: BC Managed Care – PPO | Attending: Nurse Practitioner

## 2022-08-23 ENCOUNTER — Other Ambulatory Visit: Payer: Self-pay

## 2022-08-23 VITALS — BP 113/75 | HR 97 | Temp 97.0°F | Resp 18 | Wt 150.0 lb

## 2022-08-23 DIAGNOSIS — Z8041 Family history of malignant neoplasm of ovary: Secondary | ICD-10-CM | POA: Insufficient documentation

## 2022-08-23 DIAGNOSIS — C8464 Anaplastic large cell lymphoma, ALK-positive, lymph nodes of axilla and upper limb: Secondary | ICD-10-CM | POA: Diagnosis not present

## 2022-08-23 DIAGNOSIS — C8460 Anaplastic large cell lymphoma, ALK-positive, unspecified site: Secondary | ICD-10-CM | POA: Diagnosis not present

## 2022-08-23 DIAGNOSIS — F419 Anxiety disorder, unspecified: Secondary | ICD-10-CM

## 2022-08-23 DIAGNOSIS — D649 Anemia, unspecified: Secondary | ICD-10-CM | POA: Diagnosis not present

## 2022-08-23 DIAGNOSIS — F32A Depression, unspecified: Secondary | ICD-10-CM

## 2022-08-23 DIAGNOSIS — Z9481 Bone marrow transplant status: Secondary | ICD-10-CM | POA: Diagnosis not present

## 2022-08-23 DIAGNOSIS — Z9484 Stem cells transplant status: Secondary | ICD-10-CM | POA: Insufficient documentation

## 2022-08-23 DIAGNOSIS — C8468 Anaplastic large cell lymphoma, ALK-positive, lymph nodes of multiple sites: Secondary | ICD-10-CM | POA: Diagnosis present

## 2022-08-23 DIAGNOSIS — K649 Unspecified hemorrhoids: Secondary | ICD-10-CM

## 2022-08-23 LAB — CMP (CANCER CENTER ONLY)
ALT: 63 U/L — ABNORMAL HIGH (ref 0–44)
AST: 36 U/L (ref 15–41)
Albumin: 4.1 g/dL (ref 3.5–5.0)
Alkaline Phosphatase: 68 U/L (ref 38–126)
Anion gap: 9 (ref 5–15)
BUN: 9 mg/dL (ref 6–20)
CO2: 25 mmol/L (ref 22–32)
Calcium: 9.4 mg/dL (ref 8.9–10.3)
Chloride: 104 mmol/L (ref 98–111)
Creatinine: 0.74 mg/dL (ref 0.44–1.00)
GFR, Estimated: >60 mL/min (ref >60)
Glucose, Bld: 106 mg/dL — ABNORMAL HIGH (ref 70–99)
Potassium: 4 mmol/L (ref 3.5–5.1)
Sodium: 138 mmol/L (ref 135–145)
Total Bilirubin: 0.5 mg/dL (ref 0.3–1.2)
Total Protein: 7.7 g/dL (ref 6.5–8.1)

## 2022-08-23 LAB — CBC WITH DIFFERENTIAL (CANCER CENTER ONLY)
Abs Immature Granulocytes: 0 10*3/uL (ref 0.00–0.07)
Basophils Absolute: 0 10*3/uL (ref 0.0–0.1)
Basophils Relative: 0 %
Eosinophils Absolute: 0 10*3/uL (ref 0.0–0.5)
Eosinophils Relative: 0 %
HCT: 30.7 % — ABNORMAL LOW (ref 36.0–46.0)
Hemoglobin: 10.1 g/dL — ABNORMAL LOW (ref 12.0–15.0)
Immature Granulocytes: 0 %
Lymphocytes Relative: 35 %
Lymphs Abs: 0.8 10*3/uL (ref 0.7–4.0)
MCH: 31.5 pg (ref 26.0–34.0)
MCHC: 32.9 g/dL (ref 30.0–36.0)
MCV: 95.6 fL (ref 80.0–100.0)
Monocytes Absolute: 0.5 10*3/uL (ref 0.1–1.0)
Monocytes Relative: 22 %
Neutro Abs: 1 10*3/uL — ABNORMAL LOW (ref 1.7–7.7)
Neutrophils Relative %: 43 %
Platelet Count: 92 10*3/uL — ABNORMAL LOW (ref 150–400)
RBC: 3.21 MIL/uL — ABNORMAL LOW (ref 3.87–5.11)
RDW: 16.1 % — ABNORMAL HIGH (ref 11.5–15.5)
WBC Count: 2.3 10*3/uL — ABNORMAL LOW (ref 4.0–10.5)
nRBC: 0 % (ref 0.0–0.2)

## 2022-08-23 LAB — MAGNESIUM: Magnesium: 1.6 mg/dL — ABNORMAL LOW (ref 1.7–2.4)

## 2022-08-23 MED ORDER — LIDOCAINE (ANORECTAL) 5 % EX CREA: 1.0000 | TOPICAL_CREAM | Freq: Two times a day (BID) | CUTANEOUS | 1 refills | Status: AC | PRN

## 2022-08-23 NOTE — Progress Notes (Signed)
Patient here for oncology follow-up appointment,  concerns of hemorrhoids 

## 2022-08-23 NOTE — Assessment & Plan Note (Signed)
escitalopram 10 mg PO daily

## 2022-08-23 NOTE — Assessment & Plan Note (Signed)
Stable hemoglobin.  Repeat CBC in 1 week.

## 2022-08-23 NOTE — Assessment & Plan Note (Addendum)
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 CSF flowcytometry pending.  Recommend salvage therapy bridging to allogenic transplant Labs are reviewed and discussed with patient. PET after 2 cycles- de[place] 3  S/p 4 cycles of ICE with GCSF support. 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  Today's blood work showed improved thrombocytopenia.  Hemoglobin 10.1, stable. Neutropenia, ANC 1.  Repeat CBC in 1 week.

## 2022-08-23 NOTE — Assessment & Plan Note (Signed)
Acyclovir 400 mg PO Q12hrs for a minimum of 1 year post-transplant.  Follow-up with [facility] for posttransplant immunization protocol.

## 2022-08-23 NOTE — Assessment & Plan Note (Signed)
Recommend topical lidocaine externally twice daily as needed.

## 2022-08-23 NOTE — Progress Notes (Signed)
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 CSF flowcytometry pending.  Recommend salvage therapy bridging to allogenic transplant Labs are reviewed and discussed with patient. PET after 2 cycles- deauville 3  S/p 4 cycles of ICE with GCSF support. 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  Today's blood work showed improved thrombocytopenia.  Hemoglobin 10.1, stable. Neutropenia, ANC 1.  Repeat CBC in 1 week.  History of bone marrow transplant Acyclovir 400 mg PO Q12hrs for a minimum of 1 year post-transplant.  Follow-up with [facility] for posttransplant immunization protocol.  Anxiety and depression escitalopram 10 mg PO daily   Anemia Stable hemoglobin.  Repeat CBC in 1 week.  Hemorrhoids Recommend topical lidocaine externally twice daily as needed.  Orders Placed This Encounter  Procedures   CBC with Differential (Cancer Center Only)    Standing Status:   Future    Standing Expiration Date:   08/23/2023   CBC with Differential (Cancer Center Only)    Standing Status:   Future    Standing Expiration Date:   08/23/2023   CMP (Cancer Center only)     Standing Status:   Future    Standing Expiration Date:   08/23/2023   Sample to Blood Bank    Standing Status:   Future    Standing Expiration Date:   08/23/2023   Follow-up  1 week labs CBC, 4 weeks lab MD  All questions were answered. The patient knows to call the clinic with any problems, questions or concerns.  Rickard Patience, MD, PhD [facility] Hematology Oncology 08/23/2022     HISTORY OF PRESENTING ILLNESS:   Angel Tate is a  32 y.o.  female presents for follow-up of ALK positive anaplastic large cell lymphoma Oncology history summary is listed below. 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  Initially the mass did not bother her.  Patient developed a rash in the right axillary/upper outer quadrant of the breast and had additional work-up.  Patient was treated with a 10-day course of doxycycline for possible infection.  Did not improve.  #02/04/2020, targeted ultrasound showed right axillary mass measuring 6.2 x 4 by 4 cm.  There are 2 adjacent smaller axillary mass measuring 2.0 x 1.2 x 1.4 cm and 0.8 x 0.7 x 0.9 cm.  Mammogram showed dense fibroglandular.  No suspicious mass or  malignant type microcalcifications or distortion detected in either breast.  # Patient underwent ultrasound-guided core biopsy of the right axillary mass. 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  Ki-67 80-90%, Flow cytometry has insufficient cells for analysis.    02/17/2020 Bone Marrow Biopsy   Bone marrow biopsy is negative   02/18/2020  Echocardiogram   Echocardiogram showed normal LVEF   03/02/2020 Imaging   PET scan showed right axillary pathological adenopathy, largest lymph node 3.5 cm with maximum SUV 42.8.  An adjacent smaller peripheral right axillary lymph node measuring 1.3 cm with SUV of 23.3. Anterior mediastinal density favors benign thymic tissue maximum SUV 3.0 which is due Deuville 4, far below the level of last has lymphadenopathy.   03/03/2020 - 05/30/2020 Chemotherapy   03/03/2020 - 05/30/2020 4 cycles of BV AVD Q21 days.   Patient does not desire to preserve fertility.  She has IUD for contraceptive measures.   05/20/2020 Imaging   PET showed complete response   06/02/2020 - 06/25/2020 Chemotherapy   2 cycles of BV AVD Q21 days   09/01/2020 Imaging   PET scan  1. No signs to suggest residual or recurrent FDG avid tumor.2. Resolution of previously noted generalized marrow hypermetabolism.3. FDG avid brown fat noted within the neck and chest compatible with benign physiologic activity.   09/13/2020 Procedure   Her Mediport was removed by Dr. Wyn Quaker.   03/01/2021 Imaging   Surveillance PET showed No evidence of active lymphoma.   10/13/2021 Imaging   Surveillance PET showed  1. No evidence of lymphoma recurrence. No lymphadenopathy. Normal spleen and bone marrow.2. Particular attention directed to the RIGHT axilla.3. Benign cyst of the LEFT ovary   03/03/2022 Mammogram   Patient palpated left breast mass with rapid growth.   Bilateral diagnostic mammogram/ left axilla  showed 1. Suspicious 2.4 cm mass along the 11 o'clock axis of the left breast. Recommend ultrasound-guided biopsy. 2. No suspicious left axillary lymphadenopathy. 3. No mammographic evidence of malignancy on the right.   03/06/2022 Relapse/Recurrence   Left breast mass biopsy showed ALK positive anaplastic large cell lymphoma. Ki67 100% Flowcytometry is overall non contributory.    03/15/2022 Imaging   PET restaging showed  1. New  hypermetabolic round mass in the medial LEFT breast consistent with lymphoma recurrence. 2. New lesion in the mid body of the pancreas consistent with lymphoma recurrence/metastasis. 3. Very small hypermetabolic RIGHT supraclavicular node is concerning for lymphoma. 4. Two pulmonary nodules in the RIGHT lung are new from prior. Indeterminate nodules.    03/21/2022 Cancer Staging   Staging form: Hodgkin and Non-Hodgkin Lymphoma, AJCC 8th Edition - Clinical stage from 03/21/2022: Stage IV - Signed by Rickard Patience, MD on 04/08/2022 Histopathologic type: Anaplastic large cell lymphoma, T cell and Null cell type Stage prefix: Recurrence   03/28/2022 Echocardiogram   2D Echo LVEF 60-65%   03/28/2022 Bone Marrow Biopsy   Bone marrow biopsy showed  Aspirate : no immunophenotypic evidence of a lymphoproliferative disorder (no monoclonal B cells or immunophenotypically abnormal T cells detected).  Core biopsy showed Hypocellular bone marrow (30%) with otherwise orderly trilineage hematopoiesis. -No morphologic or immunohistochemical evidence of the patient's known  recurrent anaplastic large cell lymphoma.      04/04/2022 -  Chemotherapy   Patient is on Treatment Plan : NON-HODGKINS LYMPHOMA ICE q21d   Intrathecal with MTX , Ara C  and hydrocortisone 100 mg    04/04/2022 Procedure   Lumbar puncture   CSF flowcytometry  Insufficient B and T cells detected to adequately evaluate for clonality or aberrancy in a low cellularity specimen,    04/20/2022 Procedure   S/p placement of medi port by Dr.Dew   05/09/2022 Imaging   PET restaging 1. Findings suggest a good response to treatment. The left medial breast lesion is smaller and significantly less hypermetabolic. The right supraclavicular node has resolved and the pancreatic lesion has resolved. SUV < liver  2. No new disease is demonstrated. 3. Diffuse marrow hypermetabolism likely chemotherapy effect or due   07/26/2022 Bone Marrow  Transplant   s/p autologous stem cell transplant on 07/26/2022 with BEAM conditioning. They were admitted for syncope and neutropenic fever on 3/30. Her admission was complicated by esophagitis and fevers concerning for engraftment syndrome, requiring a steroid course. The patient was discharged on 4/9     Today patient presents for follow-up.  Status post autologous bone marrow transplant. Currently she feels well.  She has developed hemorrhoids, some discomfort while sitting.  No fever, chills, nausea vomiting, abdominal pain,    Review of Systems  Constitutional:  Positive for fatigue. Negative for appetite change, chills, fever and unexpected weight change.  HENT:   Negative for hearing loss and voice change.   Eyes:  Negative for eye problems.  Respiratory:  Negative for chest tightness, cough and shortness of breath.   Cardiovascular:  Negative for chest pain.  Gastrointestinal:  Negative for abdominal distention, abdominal pain, blood in stool, nausea and vomiting.       Hemorrhoids  Endocrine: Negative for hot flashes.  Genitourinary:  Negative for difficulty urinating and frequency.   Musculoskeletal:  Negative for arthralgias.  Skin:  Negative for itching and rash.  Neurological:  Negative for extremity weakness and headaches.  Hematological:  Negative for adenopathy.  Psychiatric/Behavioral:  Negative for confusion. The patient is not nervous/anxious.     MEDICAL HISTORY:  Past Medical History:  Diagnosis Date   Anxiety    Cancer    Depression    Medical history non-contributory     SURGICAL HISTORY: Past Surgical History:  Procedure Laterality Date   BREAST BIOPSY Left 03/06/2022   [place] LT BREAST BX W LOC DEV 1ST LESION IMG BX SPEC US GUIDE 03/06/2022 [facility] MAMMOGRAPHY   DILATION AND CURETTAGE OF UTERUS     DILATION AND EVACUATION N/A 02/17/2017   Procedure: DILATATION AND EVACUATION;  Surgeon: Myna Hidalgo, DO;  Location: [facility];  Service: Gynecology;  Laterality:  N/A;   IR FL GUIDED LOC OF NEEDLE/CATH TIP FOR SPINAL INJECTION LT  04/04/2022   IR FLUORO GUIDED NEEDLE PLC ASPIRATION/INJECTION LOC  04/07/2022   PORTA CATH INSERTION N/A 02/23/2020   Procedure: PORTA CATH INSERTION;  Surgeon: Annice Needy, MD;  Location: [facility] IN[facility]AS[facility];  Service: Cardiovascular;  Laterality: N/A;   PORTA CATH INSERTION N/A 04/20/2022   Procedure: PORTA CATH INSERTION;  Surgeon: Annice Needy, MD;  Location: [facility] CV [facility];  Service: Cardiovascular;  Laterality: N/A;   PORTA CATH REMOVAL N/A 09/13/2020   Procedure: PORTA CATH REMOVAL;  Surgeon: Annice Needy, MD;  Location: [facility]VAS[facility] CV [facility];  Service: Cardiovascular;  Laterality: N/A;    SOCIAL HISTORY: Social History   Socioeconomic History   Marital status: Married    Spouse name: Selena Batten   Number of children: 1   Years of education: Not on file   Highest education level: Not on file  Occupational History   Occupation: social media- marketing   Tobacco Use  Smoking status: Never   Smokeless tobacco: Never  Vaping Use   Vaping Use: Never used  Substance and Sexual Activity   Alcohol use: Yes    Comment: socially   Drug use: No   Sexual activity: Yes    Birth control/protection: None  Other Topics Concern   Not on file  Social History Narrative   Not on file   Social Determinants of Health   Financial Resource Strain: Low Risk  (01/16/2019)   Overall Financial Resource Strain (CARDIA)    Difficulty of Paying Living Expenses: Not hard at all  Food Insecurity: No Food Insecurity (01/16/2019)   Hunger Vital Sign    Worried About Running Out of Food in the Last Year: Never true    Ran Out of Food in the Last Year: Never true  Transportation Needs: No Transportation Needs (01/16/2019)   PRAPARE - Administrator, Civil Service (Medical): No    Lack of Transportation (Non-Medical): No  Physical Activity: Unknown (01/19/2019)   Exercise Vital Sign    Days of Exercise per Week: Patient  declined    Minutes of Exercise per Session: Patient declined  Stress: No Stress Concern Present (01/19/2019)   [employer] of Occupational Health - Occupational Stress Questionnaire    Feeling of Stress : Only a little  Social Connections: Unknown (01/19/2019)   Social Connection and Isolation Panel [NHANES]    Frequency of Communication with Friends and Family: Patient declined    Frequency of Social Gatherings with Friends and Family: Patient declined    Attends Religious Services: Patient declined    Database administrator or Organizations: Patient declined    Attends Banker Meetings: Patient declined    Marital Status: Patient declined  Intimate Partner Violence: Not At Risk (01/19/2019)   Humiliation, Afraid, Rape, and Kick questionnaire    Fear of Current or Ex-Partner: No    Emotionally Abused: No    Physically Abused: No    Sexually Abused: No    FAMILY HISTORY: Family History  Problem Relation Age of Onset   Asthma Maternal Aunt    Hypertension Maternal Grandmother    Ovarian cancer Paternal Grandmother        dx 28s-80s    ALLERGIES:  has No Known Allergies.  MEDICATIONS:  Current Outpatient Medications  Medication Sig Dispense Refill   acetaminophen (TYLENOL) 500 MG tablet Take 500 mg by mouth every 6 (six) hours as needed.     acyclovir (ZOVIRAX) 400 MG tablet Take by mouth.     allopurinol (ZYLOPRIM) 300 MG tablet TAKE 1 TABLET BY MOUTH EVERY DAY 90 tablet 0   ALPRAZolam (XANAX) 0.5 MG tablet Take 1 tablet (0.5 mg total) by mouth daily as needed for anxiety. 7 tablet 0   butalbital-acetaminophen-caffeine (FIORICET) 50-325-40 MG tablet Take 1 tablet by mouth every 6 (six) hours as needed for headache. 20 tablet 0   chlorhexidine (PERIDEX) 0.12 % solution Use as directed 5 mLs in the mouth or throat 2 (two) times daily. 473 mL 0   dexamethasone (DECADRON) 4 MG tablet Take 2 tablets (8 mg total) by mouth daily. Start the day after chemotherapy for  2 days. Take with food. 30 tablet 1   docusate sodium (COLACE) 100 MG capsule Take 1 capsule (100 mg total) by mouth 2 (two) times daily. 10 capsule 0   escitalopram (LEXAPRO) 10 MG tablet Take 10 mg by mouth daily.     escitalopram (LEXAPRO) 5 MG tablet Take  5 mg by mouth daily.     Lidocaine, Anorectal, (HEMORRHOIDAL RELIEF) 5 % CREA Apply 1 Application topically 2 (two) times daily as needed. 28 g 1   lidocaine-prilocaine (EMLA) cream Apply small amount to port-a-cath site approximately 1-2 hours to port being accessed 30 g 3   loperamide (IMODIUM) 2 MG capsule Take 1 capsule (2 mg total) by mouth See admin instructions. Initial: 4 mg, followed by 2 mg every 2 to 4 hours or after each loose stool; Max 16 mg /24 hours 60 capsule 1   LORazepam (ATIVAN) 1 MG tablet Take by mouth.     ondansetron (ZOFRAN) 8 MG tablet Take 1 tablet (8 mg total) by mouth every 8 (eight) hours as needed for nausea or vomiting. Start 2 days after chemotherapy. 30 tablet 1   oxyCODONE (OXY IR/ROXICODONE) 5 MG immediate release tablet Take 1-2 tablets (5-10 mg total) by mouth every 6 (six) hours as needed for severe pain. 90 tablet 0   pantoprazole (PROTONIX) 40 MG tablet Take by mouth.     prochlorperazine (COMPAZINE) 10 MG tablet Take 1 tablet (10 mg total) by mouth every 6 (six) hours as needed for nausea or vomiting. 30 tablet 1   promethazine (PHENERGAN) 25 MG tablet Take 1 tablet (25 mg total) by mouth every 6 (six) hours as needed for nausea or vomiting. 120 tablet 2   ursodiol (ACTIGALL) 300 MG capsule Take by mouth.     venlafaxine (EFFEXOR) 37.5 MG tablet TAKE 1 TABLET BY MOUTH EVERY DAY 90 tablet 1   naloxone (NARCAN) nasal spray 4 mg/0.1 mL SPRAY 1 SPRAY INTO ONE NOSTRIL AS DIRECTED FOR OPIOID OVERDOSE (TURN PERSON ON SIDE AFTER DOSE. IF NO RESPONSE IN 2-3 MINUTES OR PERSON RESPONDS BUT RELAPSES, REPEAT USING A NEW SPRAY DEVICE AND SPRAY INTO THE OTHER NOSTRIL. CALL 911 AFTER USE.) * EMERGENCY USE ONLY * (Patient  not taking: Reported on 04/25/2022) 1 each 0   No current facility-administered medications for this visit.   Facility-Administered Medications Ordered in Other Visits  Medication Dose Route Frequency Provider Last Rate Last Admin   heparin lock flush 100 UNIT/ML injection            sodium chloride flush (NS) 0.9 % injection 10 mL  10 mL Intravenous PRN Rickard Patience, MD   10 mL at 05/16/22 0945     PHYSICAL EXAMINATION: ECOG PERFORMANCE STATUS: 1 - Symptomatic but completely ambulatory Vitals:   08/23/22 1422  BP: 113/75  Pulse: 97  Resp: 18  Temp: (!) 97 F (36.1 C)  SpO2: 98%   Filed Weights   08/23/22 1422  Weight: 150 lb (68 kg)     Physical Exam Constitutional:      General: She is not in acute distress. HENT:     Head: Normocephalic and atraumatic.  Eyes:     General: No scleral icterus. Cardiovascular:     Rate and Rhythm: Normal rate and regular rhythm.  Pulmonary:     Effort: Pulmonary effort is normal. No respiratory distress.     Breath sounds: No wheezing.  Abdominal:     General: Bowel sounds are normal. There is no distension.     Palpations: Abdomen is soft.  Musculoskeletal:        General: No deformity. Normal range of motion.     Cervical back: Normal range of motion and neck supple.  Skin:    General: Skin is warm and dry.     Findings: Lesion present.  Neurological:     Mental Status: She is alert and oriented to person, place, and time. Mental status is at baseline.     Cranial Nerves: No cranial nerve deficit.     Coordination: Coordination normal.  Psychiatric:        Mood and Affect: Mood normal.   04/04/22   LABORATORY DATA:  I have reviewed the data as listed     Latest Ref Rng & Units 08/23/2022    1:57 PM 06/26/2022    3:15 PM 06/20/2022    9:00 AM  CBC  WBC 4.0 - 10.5 K/uL 2.3  9.8  5.4   Hemoglobin 12.0 - 15.0 g/dL 16.1  9.4  6.5   Hematocrit 36.0 - 46.0 % 30.7  30.5  20.2   Platelets 150 - 400 K/uL 92  222  45        Latest Ref Rng & Units 08/23/2022    1:57 PM 06/20/2022    9:00 AM 06/06/2022   10:05 AM  CMP  Glucose 70 - 99 mg/dL 096  97  045   BUN 6 - 20 mg/dL 9  10  13    Creatinine 0.44 - 1.00 mg/dL 4.09  8.11  9.14   Sodium 135 - 145 mmol/L 138  140  137   Potassium 3.5 - 5.1 mmol/L 4.0  4.2  4.2   Chloride 98 - 111 mmol/L 104  106  103   CO2 22 - 32 mmol/L 25  24  24    Calcium 8.9 - 10.3 mg/dL 9.4  9.2  9.4   Total Protein 6.5 - 8.1 g/dL 7.7  7.2  7.7   Total Bilirubin 0.3 - 1.2 mg/dL 0.5  0.5  0.3   Alkaline Phos 38 - 126 U/L 68  68  65   AST 15 - 41 U/L 36  26  25   ALT 0 - 44 U/L 63  41  29     Iron/TIBC/Ferritin/ %Sat    Component Value Date/Time   IRON 162 06/20/2022 0900   TIBC 297 06/20/2022 0900   FERRITIN 317 (H) 06/20/2022 0900   IRONPCTSAT 55 (H) 06/20/2022 0900      RADIOGRAPHIC STUDIES: I have personally reviewed the radiological images as listed and agreed with the findings in the report. No results found.

## 2022-08-24 ENCOUNTER — Telehealth: Payer: Self-pay

## 2022-08-24 NOTE — Telephone Encounter (Signed)
Pt informed of the need for Magnesium via Mychart.   Please contact her to set up IV mag either today or tomorrow.

## 2022-08-24 NOTE — Telephone Encounter (Signed)
-----   Message from Rickard Patience, MD sent at 08/23/2022  7:34 PM EDT ----- Mag is low at 1.6 She just recovered from diarrhea.  Recommend IV Mag 2g x 1 this week. Please let her know and arrange thanks.

## 2022-08-25 ENCOUNTER — Inpatient Hospital Stay: Payer: BC Managed Care – PPO

## 2022-08-25 DIAGNOSIS — C8460 Anaplastic large cell lymphoma, ALK-positive, unspecified site: Secondary | ICD-10-CM | POA: Diagnosis not present

## 2022-08-25 MED ORDER — MAGNESIUM SULFATE 2 GM/50ML IV SOLN
2.0000 g | Freq: Once | INTRAVENOUS | Status: AC
  Administered 2022-08-25: 2 g via INTRAVENOUS
  Filled 2022-08-25: qty 50

## 2022-08-25 MED ORDER — HEPARIN SOD (PORK) LOCK FLUSH 100 UNIT/ML IV SOLN
500.0000 [IU] | Freq: Once | INTRAVENOUS | Status: DC | PRN
  Filled 2022-08-25: qty 5

## 2022-08-25 MED ORDER — SODIUM CHLORIDE 0.9 % IV SOLN
Freq: Once | INTRAVENOUS | Status: AC
  Filled 2022-08-25: qty 250

## 2022-08-25 MED ORDER — SODIUM CHLORIDE 0.9% FLUSH
10.0000 mL | Freq: Once | INTRAVENOUS | Status: DC | PRN
  Filled 2022-08-25: qty 10

## 2022-08-30 ENCOUNTER — Inpatient Hospital Stay: Payer: BC Managed Care – PPO | Attending: Nurse Practitioner

## 2022-08-30 DIAGNOSIS — R635 Abnormal weight gain: Secondary | ICD-10-CM | POA: Diagnosis not present

## 2022-08-30 DIAGNOSIS — D649 Anemia, unspecified: Secondary | ICD-10-CM | POA: Diagnosis not present

## 2022-08-30 DIAGNOSIS — Z8041 Family history of malignant neoplasm of ovary: Secondary | ICD-10-CM | POA: Diagnosis not present

## 2022-08-30 DIAGNOSIS — Z9481 Bone marrow transplant status: Secondary | ICD-10-CM | POA: Diagnosis not present

## 2022-08-30 DIAGNOSIS — F32A Depression, unspecified: Secondary | ICD-10-CM | POA: Diagnosis not present

## 2022-08-30 DIAGNOSIS — Z79899 Other long term (current) drug therapy: Secondary | ICD-10-CM | POA: Diagnosis not present

## 2022-08-30 DIAGNOSIS — C8468 Anaplastic large cell lymphoma, ALK-positive, lymph nodes of multiple sites: Secondary | ICD-10-CM | POA: Diagnosis present

## 2022-08-30 DIAGNOSIS — F419 Anxiety disorder, unspecified: Secondary | ICD-10-CM | POA: Diagnosis not present

## 2022-08-30 LAB — CBC WITH DIFFERENTIAL (CANCER CENTER ONLY)
Abs Immature Granulocytes: 0.01 10*3/uL (ref 0.00–0.07)
Basophils Absolute: 0 10*3/uL (ref 0.0–0.1)
Basophils Relative: 0 %
Eosinophils Absolute: 0 10*3/uL (ref 0.0–0.5)
Eosinophils Relative: 1 %
HCT: 32.9 % — ABNORMAL LOW (ref 36.0–46.0)
Hemoglobin: 10.5 g/dL — ABNORMAL LOW (ref 12.0–15.0)
Immature Granulocytes: 0 %
Lymphocytes Relative: 39 %
Lymphs Abs: 1.2 10*3/uL (ref 0.7–4.0)
MCH: 31.3 pg (ref 26.0–34.0)
MCHC: 31.9 g/dL (ref 30.0–36.0)
MCV: 98.2 fL (ref 80.0–100.0)
Monocytes Absolute: 0.7 10*3/uL (ref 0.1–1.0)
Monocytes Relative: 24 %
Neutro Abs: 1.1 10*3/uL — ABNORMAL LOW (ref 1.7–7.7)
Neutrophils Relative %: 36 %
Platelet Count: 175 10*3/uL (ref 150–400)
RBC: 3.35 MIL/uL — ABNORMAL LOW (ref 3.87–5.11)
RDW: 17.1 % — ABNORMAL HIGH (ref 11.5–15.5)
WBC Count: 3 10*3/uL — ABNORMAL LOW (ref 4.0–10.5)
nRBC: 0 % (ref 0.0–0.2)

## 2022-09-01 LAB — SAMPLE TO BLOOD BANK

## 2022-09-21 ENCOUNTER — Inpatient Hospital Stay (HOSPITAL_BASED_OUTPATIENT_CLINIC_OR_DEPARTMENT_OTHER): Payer: BC Managed Care – PPO | Admitting: Oncology

## 2022-09-21 ENCOUNTER — Other Ambulatory Visit: Payer: Self-pay | Admitting: Oncology

## 2022-09-21 ENCOUNTER — Encounter: Payer: Self-pay | Admitting: Oncology

## 2022-09-21 ENCOUNTER — Inpatient Hospital Stay: Payer: BC Managed Care – PPO

## 2022-09-21 VITALS — BP 108/78 | HR 104 | Temp 98.5°F | Resp 18 | Wt 154.6 lb

## 2022-09-21 DIAGNOSIS — D649 Anemia, unspecified: Secondary | ICD-10-CM | POA: Diagnosis not present

## 2022-09-21 DIAGNOSIS — C8464 Anaplastic large cell lymphoma, ALK-positive, lymph nodes of axilla and upper limb: Secondary | ICD-10-CM | POA: Diagnosis not present

## 2022-09-21 DIAGNOSIS — F419 Anxiety disorder, unspecified: Secondary | ICD-10-CM

## 2022-09-21 DIAGNOSIS — R051 Acute cough: Secondary | ICD-10-CM

## 2022-09-21 DIAGNOSIS — F32A Depression, unspecified: Secondary | ICD-10-CM

## 2022-09-21 DIAGNOSIS — Z9481 Bone marrow transplant status: Secondary | ICD-10-CM

## 2022-09-21 DIAGNOSIS — C8468 Anaplastic large cell lymphoma, ALK-positive, lymph nodes of multiple sites: Secondary | ICD-10-CM | POA: Diagnosis not present

## 2022-09-21 LAB — CMP (CANCER CENTER ONLY)
ALT: 26 U/L (ref 0–44)
AST: 31 U/L (ref 15–41)
Albumin: 4.3 g/dL (ref 3.5–5.0)
Alkaline Phosphatase: 66 U/L (ref 38–126)
Anion gap: 14 (ref 5–15)
BUN: 9 mg/dL (ref 6–20)
CO2: 24 mmol/L (ref 22–32)
Calcium: 9.5 mg/dL (ref 8.9–10.3)
Chloride: 100 mmol/L (ref 98–111)
Creatinine: 0.73 mg/dL (ref 0.44–1.00)
GFR, Estimated: >60 mL/min (ref >60)
Glucose, Bld: 100 mg/dL — ABNORMAL HIGH (ref 70–99)
Potassium: 3.9 mmol/L (ref 3.5–5.1)
Sodium: 138 mmol/L (ref 135–145)
Total Bilirubin: 0.5 mg/dL (ref 0.3–1.2)
Total Protein: 7.8 g/dL (ref 6.5–8.1)

## 2022-09-21 LAB — CBC WITH DIFFERENTIAL (CANCER CENTER ONLY)
Abs Immature Granulocytes: 0.01 10*3/uL (ref 0.00–0.07)
Basophils Absolute: 0 10*3/uL (ref 0.0–0.1)
Basophils Relative: 1 %
Eosinophils Absolute: 0.1 10*3/uL (ref 0.0–0.5)
Eosinophils Relative: 2 %
HCT: 38.1 % (ref 36.0–46.0)
Hemoglobin: 12.7 g/dL (ref 12.0–15.0)
Immature Granulocytes: 0 %
Lymphocytes Relative: 43 %
Lymphs Abs: 1.5 10*3/uL (ref 0.7–4.0)
MCH: 32.7 pg (ref 26.0–34.0)
MCHC: 33.3 g/dL (ref 30.0–36.0)
MCV: 98.2 fL (ref 80.0–100.0)
Monocytes Absolute: 0.8 10*3/uL (ref 0.1–1.0)
Monocytes Relative: 23 %
Neutro Abs: 1.1 10*3/uL — ABNORMAL LOW (ref 1.7–7.7)
Neutrophils Relative %: 31 %
Platelet Count: 174 10*3/uL (ref 150–400)
RBC: 3.88 MIL/uL (ref 3.87–5.11)
RDW: 16.3 % — ABNORMAL HIGH (ref 11.5–15.5)
WBC Count: 3.5 10*3/uL — ABNORMAL LOW (ref 4.0–10.5)
nRBC: 0 % (ref 0.0–0.2)

## 2022-09-21 LAB — MAGNESIUM: Magnesium: 1.8 mg/dL (ref 1.7–2.4)

## 2022-09-21 NOTE — Assessment & Plan Note (Addendum)
Patient is on Lexapro 10 mg PO daily, she feels her mood has improved, has weight gain as a side effect. Recommend patient to decrease dose to 5 mg p.o. daily for several weeks and then slowly taper off

## 2022-09-21 NOTE — Assessment & Plan Note (Signed)
Acyclovir 400 mg PO Q12hrs for a minimum of 1 year post-transplant.  Follow-up with [facility] for posttransplant immunization protocol. 

## 2022-09-21 NOTE — Assessment & Plan Note (Addendum)
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 CSF flowcytometry pending.  Recommend salvage therapy bridging to allogenic transplant Labs are reviewed and discussed with patient. PET after 2 cycles- deauville 3  S/p 4 cycles of ICE with GCSF support. 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   Labs are reviewed and discussed with patient.   Mild neutropenia stable, hemoglobin has continued to improve. Continue follow-up with [facility] oncology for her 3 months posttransplant PET scan

## 2022-09-21 NOTE — Assessment & Plan Note (Signed)
Stable hemoglobin 

## 2022-09-21 NOTE — Progress Notes (Signed)
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 CSF flowcytometry pending.  Recommend salvage therapy bridging to allogenic transplant Labs are reviewed and discussed with patient. PET after 2 cycles- deauville 3  S/p 4 cycles of ICE with GCSF support. 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   Labs are reviewed and discussed with patient.   Mild neutropenia stable, hemoglobin has continued to improve. Continue follow-up with [facility]ology for her 3 months posttransplant PET scan   Anxiety and depression Patient is on Lexapro 10 mg PO daily, she feels her mood has improved, has weight gain as a side effect. Recommend patient to decrease dose to 5 mg p.o. daily for several weeks and then slowly taper off  Anemia Stable hemoglobin.   History of bone marrow transplant (HCC) Acyclovir 400 mg PO Q12hrs for a minimum of 1 year post-transplant.  Follow-up with [facility] for posttransplant immunization protocol.  Orders Placed This Encounter  Procedures   DG Chest 2 View    Standing Status:   Future    Standing Expiration Date:   09/21/2023    Order  Specific Question:   Reason for Exam (SYMPTOM  OR DIAGNOSIS REQUIRED)    Answer:   cough    Order Specific Question:   Is patient pregnant?    Answer:   No    Order Specific Question:   Preferred imaging location?    Answer:   [facility]   Follow-up  As needed.  All questions were answered. The patient knows to call the clinic with any problems, questions or concerns.  Rickard Patience, MD, PhD [facility] Hematology Oncology 09/21/2022     HISTORY OF PRESENTING ILLNESS:   Angel Tate is a  32 y.o.  female presents for follow-up of ALK positive anaplastic large cell lymphoma Oncology history summary is listed below. 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  Initially the mass did not bother her.  Patient developed a rash in the right axillary/upper outer quadrant of the breast and had additional work-up.  Patient was treated with a 10-day course of doxycycline for possible infection.  Did not improve.  #02/04/2020, targeted ultrasound showed right axillary mass measuring 6.2 x 4 by 4 cm.  There are 2 adjacent smaller axillary mass measuring 2.0 x 1.2 x 1.4 cm and 0.8 x  0.7 x 0.9 cm.  Mammogram showed dense fibroglandular.  No suspicious mass or malignant type microcalcifications or distortion detected in either breast.  # Patient underwent ultrasound-guided core biopsy of the right axillary mass. 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  Ki-67 80-90%, Flow cytometry has insufficient cells for analysis.    02/17/2020 Bone Marrow  Biopsy   Bone marrow biopsy is negative   02/18/2020 Echocardiogram   Echocardiogram showed normal LVEF   03/02/2020 Imaging   PET scan showed right axillary pathological adenopathy, largest lymph node 3.5 cm with maximum SUV 42.8.  An adjacent smaller peripheral right axillary lymph node measuring 1.3 cm with SUV of 23.3. Anterior mediastinal density favors benign thymic tissue maximum SUV 3.0 which is due Deuville 4, far below the level of last has lymphadenopathy.   03/03/2020 - 05/30/2020 Chemotherapy   03/03/2020 - 05/30/2020 4 cycles of BV AVD Q21 days.   Patient does not desire to preserve fertility.  She has IUD for contraceptive measures.   05/20/2020 Imaging   PET showed complete response   06/02/2020 - 06/25/2020 Chemotherapy   2 cycles of BV AVD Q21 days   09/01/2020 Imaging   PET scan  1. No signs to suggest residual or recurrent FDG avid tumor.2. Resolution of previously noted generalized marrow hypermetabolism.3. FDG avid brown fat noted within the neck and chest compatible with benign physiologic activity.   09/13/2020 Procedure   Her Mediport was removed by Dr. Wyn Quaker.   03/01/2021 Imaging   Surveillance PET showed No evidence of active lymphoma.   10/13/2021 Imaging   Surveillance PET showed  1. No evidence of lymphoma recurrence. No lymphadenopathy. Normal spleen and bone marrow.2. Particular attention directed to the RIGHT axilla.3. Benign cyst of the LEFT ovary   03/03/2022 Mammogram   Patient palpated left breast mass with rapid growth.   Bilateral diagnostic mammogram/ left axilla  showed 1. Suspicious 2.4 cm mass along the 11 o'clock axis of the left breast. Recommend ultrasound-guided biopsy. 2. No suspicious left axillary lymphadenopathy. 3. No mammographic evidence of malignancy on the right.   03/06/2022 Relapse/Recurrence   Left breast mass biopsy showed ALK positive anaplastic large cell lymphoma. Ki67 100% Flowcytometry is overall non contributory.     03/15/2022 Imaging   PET restaging showed  1. New hypermetabolic round mass in the medial LEFT breast consistent with lymphoma recurrence. 2. New lesion in the mid body of the pancreas consistent with lymphoma recurrence/metastasis. 3. Very small hypermetabolic RIGHT supraclavicular node is concerning for lymphoma. 4. Two pulmonary nodules in the RIGHT lung are new from prior. Indeterminate nodules.    03/21/2022 Cancer Staging   Staging form: Hodgkin and Non-Hodgkin Lymphoma, AJCC 8th Edition - Clinical stage from 03/21/2022: Stage IV - Signed by Rickard Patience, MD on 04/08/2022 Histopathologic type: Anaplastic large cell lymphoma, T cell and Null cell type Stage prefix: Recurrence   03/28/2022 Echocardiogram   2D Echo LVEF 60-65%   03/28/2022 Bone Marrow Biopsy   Bone marrow biopsy showed  Aspirate : no immunophenotypic evidence of a lymphoproliferative disorder (no monoclonal B cells or immunophenotypically abnormal T cells detected).  Core biopsy showed Hypocellular bone marrow (30%) with otherwise orderly trilineage hematopoiesis. -No morphologic or immunohistochemical evidence of the patient's known  recurrent anaplastic large cell lymphoma.      04/04/2022 -  Chemotherapy   Patient is on Treatment Plan : NON-HODGKINS LYMPHOMA ICE q21d   Intrathecal with MTX 12mg , Ara C 40mg  and hydrocortisone 100 mg  04/04/2022 Procedure   Lumbar puncture   CSF flowcytometry  Insufficient B and T cells detected to adequately evaluate for clonality or aberrancy in a low cellularity specimen,    04/20/2022 Procedure   S/p placement of medi port by Dr.Dew   05/09/2022 Imaging   PET restaging 1. Findings suggest a good response to treatment. The left medial breast lesion is smaller and significantly less hypermetabolic. The right supraclavicular node has resolved and the pancreatic lesion has resolved. SUV < liver  2. No new disease is demonstrated. 3. Diffuse marrow hypermetabolism likely  chemotherapy effect or due   07/26/2022 Bone Marrow Transplant   s/p autologous stem cell transplant on 07/26/2022 with BEAM conditioning. They were admitted for syncope and neutropenic fever on 3/30. Her admission was complicated by esophagitis and fevers concerning for engraftment syndrome, requiring a steroid course. The patient was discharged on 4/9   Her Mediport was removed at [facility].    Today patient presents for follow-up.  Status post autologous bone marrow transplant. Currently she feels well.   Denies any fever, chills, nausea vomiting diarrhea, abdominal pain Her mood has improved, she feels less anxiety and depression.  Currently on Lexapro 10 mg daily and she wonders if she needs to continue.  She has gained weight due to the Lexapro.   Review of Systems  Constitutional:  Negative for appetite change, chills, fatigue and fever.  HENT:   Negative for hearing loss and voice change.   Eyes:  Negative for eye problems.  Respiratory:  Negative for chest tightness, cough and shortness of breath.   Cardiovascular:  Negative for chest pain.  Gastrointestinal:  Negative for abdominal distention, abdominal pain, blood in stool, nausea and vomiting.       Hemorrhoids  Endocrine: Negative for hot flashes.  Genitourinary:  Negative for difficulty urinating and frequency.   Musculoskeletal:  Negative for arthralgias.  Skin:  Negative for itching and rash.  Neurological:  Negative for extremity weakness and headaches.  Hematological:  Negative for adenopathy.  Psychiatric/Behavioral:  Negative for confusion. The patient is not nervous/anxious.     MEDICAL HISTORY:  Past Medical History:  Diagnosis Date   Anxiety    Cancer [facility])    Depression    Medical history non-contributory     SURGICAL HISTORY: Past Surgical History:  Procedure Laterality Date   BREAST BIOPSY Left 03/06/2022   [place] LT BREAST BX W LOC DEV 1ST LESION IMG BX SPEC US GUIDE 03/06/2022 [facility] MAMMOGRAPHY   DILATION  AND CURETTAGE OF UTERUS     DILATION AND EVACUATION N/A 02/17/2017   Procedure: DILATATION AND EVACUATION;  Surgeon: Myna Hidalgo, DO;  Location: [facility];  Service: Gynecology;  Laterality: N/A;   IR FL GUIDED LOC OF NEEDLE/CATH TIP FOR SPINAL INJECTION LT  04/04/2022   IR FLUORO GUIDED NEEDLE PLC ASPIRATION/INJECTION LOC  04/07/2022   PORTA CATH INSERTION N/A 02/23/2020   Procedure: PORTA CATH INSERTION;  Surgeon: Annice Needy, MD;  Location: [facility] INVASIVE CV LAB;  Service: Cardiovascular;  Laterality: N/A;   PORTA CATH INSERTION N/A 04/20/2022   Procedure: PORTA CATH INSERTION;  Surgeon: Annice Needy, MD;  Location: [facility] INVASIVE CV LAB;  Service: Cardiovascular;  Laterality: N/A;   PORTA CATH REMOVAL N/A 09/13/2020   Procedure: PORTA CATH REMOVAL;  Surgeon: Annice Needy, MD;  Location: [facility] INVASIVE CV LAB;  Service: Cardiovascular;  Laterality: N/A;    SOCIAL HISTORY: Social History   Socioeconomic History   Marital status: Married  Spouse name: Selena Batten   Number of children: 1   Years of education: Not on file   Highest education level: Not on file  Occupational History   Occupation: social media- marketing   Tobacco Use   Smoking status: Never   Smokeless tobacco: Never  Vaping Use   Vaping Use: Never used  Substance and Sexual Activity   Alcohol use: Yes    Comment: socially   Drug use: No   Sexual activity: Yes    Birth control/protection: None  Other Topics Concern   Not on file  Social History Narrative   Not on file   Social Determinants of Health   Financial Resource Strain: Low Risk  (01/16/2019)   Overall Financial Resource Strain (CARDIA)    Difficulty of Paying Living Expenses: Not hard at all  Food Insecurity: No Food Insecurity (01/16/2019)   Hunger Vital Sign    Worried About Running Out of Food in the Last Year: Never true    Ran Out of Food in the Last Year: Never true  Transportation Needs: No Transportation Needs (01/16/2019)   PRAPARE -  Administrator, Civil Service (Medical): No    Lack of Transportation (Non-Medical): No  Physical Activity: Unknown (01/19/2019)   Exercise Vital Sign    Days of Exercise per Week: Patient declined    Minutes of Exercise per Session: Patient declined  Stress: No Stress Concern Present (01/19/2019)   [employer] of Occupational Health - Occupational Stress Questionnaire    Feeling of Stress : Only a little  Social Connections: Unknown (01/19/2019)   Social Connection and Isolation Panel [NHANES]    Frequency of Communication with Friends and Family: Patient declined    Frequency of Social Gatherings with Friends and Family: Patient declined    Attends Religious Services: Patient declined    Database administrator or Organizations: Patient declined    Attends Banker Meetings: Patient declined    Marital Status: Patient declined  Intimate Partner Violence: Not At Risk (01/19/2019)   Humiliation, Afraid, Rape, and Kick questionnaire    Fear of Current or Ex-Partner: No    Emotionally Abused: No    Physically Abused: No    Sexually Abused: No    FAMILY HISTORY: Family History  Problem Relation Age of Onset   Asthma Maternal Aunt    Hypertension Maternal Grandmother    Ovarian cancer Paternal Grandmother        dx 75s-80s    ALLERGIES:  has No Known Allergies.  MEDICATIONS:  Current Outpatient Medications  Medication Sig Dispense Refill   acetaminophen (TYLENOL) 500 MG tablet Take 500 mg by mouth every 6 (six) hours as needed.     acyclovir (ZOVIRAX) 400 MG tablet Take by mouth.     butalbital-acetaminophen-caffeine (FIORICET) 50-325-40 MG tablet Take 1 tablet by mouth every 6 (six) hours as needed for headache. 20 tablet 0   chlorhexidine (PERIDEX) 0.12 % solution Use as directed 5 mLs in the mouth or throat 2 (two) times daily. 473 mL 0   escitalopram (LEXAPRO) 10 MG tablet Take 10 mg by mouth daily.     escitalopram (LEXAPRO) 5 MG tablet Take 5 mg  by mouth daily.     Lidocaine, Anorectal, (HEMORRHOIDAL RELIEF) 5 % CREA Apply 1 Application topically 2 (two) times daily as needed. 28 g 1   loperamide (IMODIUM) 2 MG capsule Take 1 capsule (2 mg total) by mouth See admin instructions. Initial: 4 mg, followed by 2  mg every 2 to 4 hours or after each loose stool; Max 16 mg /24 hours 60 capsule 1   LORazepam (ATIVAN) 1 MG tablet Take by mouth.     ondansetron (ZOFRAN) 8 MG tablet Take 1 tablet (8 mg total) by mouth every 8 (eight) hours as needed for nausea or vomiting. Start 2 days after chemotherapy. 30 tablet 1   pantoprazole (PROTONIX) 40 MG tablet Take by mouth.     prochlorperazine (COMPAZINE) 10 MG tablet Take 1 tablet (10 mg total) by mouth every 6 (six) hours as needed for nausea or vomiting. 30 tablet 1   promethazine (PHENERGAN) 25 MG tablet Take 1 tablet (25 mg total) by mouth every 6 (six) hours as needed for nausea or vomiting. 120 tablet 2   ursodiol (ACTIGALL) 300 MG capsule Take by mouth.     naloxone (NARCAN) nasal spray 4 mg/0.1 mL SPRAY 1 SPRAY INTO ONE NOSTRIL AS DIRECTED FOR OPIOID OVERDOSE (TURN PERSON ON SIDE AFTER DOSE. IF NO RESPONSE IN 2-3 MINUTES OR PERSON RESPONDS BUT RELAPSES, REPEAT USING A NEW SPRAY DEVICE AND SPRAY INTO THE OTHER NOSTRIL. CALL 911 AFTER USE.) * EMERGENCY USE ONLY * (Patient not taking: Reported on 04/25/2022) 1 each 0   No current facility-administered medications for this visit.   Facility-Administered Medications Ordered in Other Visits  Medication Dose Route Frequency Provider Last Rate Last Admin   heparin lock flush 100 UNIT/ML injection            sodium chloride flush (NS) 0.9 % injection 10 mL  10 mL Intravenous PRN Rickard Patience, MD   10 mL at 05/16/22 0945     PHYSICAL EXAMINATION: ECOG PERFORMANCE STATUS: 1 - Symptomatic but completely ambulatory Vitals:   09/21/22 0957  BP: 108/78  Pulse: (!) 104  Resp: 18  Temp: 98.5 F (36.9 C)  SpO2: 99%   Filed Weights   09/21/22 0957   Weight: 154 lb 9.6 oz (70.1 kg)     Physical Exam Constitutional:      General: She is not in acute distress. HENT:     Head: Normocephalic and atraumatic.  Eyes:     General: No scleral icterus. Cardiovascular:     Rate and Rhythm: Normal rate and regular rhythm.  Pulmonary:     Effort: Pulmonary effort is normal. No respiratory distress.     Breath sounds: No wheezing.  Abdominal:     General: Bowel sounds are normal. There is no distension.     Palpations: Abdomen is soft.  Musculoskeletal:        General: No deformity. Normal range of motion.     Cervical back: Normal range of motion and neck supple.  Skin:    General: Skin is warm and dry.     Findings: Lesion present.  Neurological:     Mental Status: She is alert and oriented to person, place, and time. Mental status is at baseline.     Cranial Nerves: No cranial nerve deficit.     Coordination: Coordination normal.  Psychiatric:        Mood and Affect: Mood normal.      LABORATORY DATA:  I have reviewed the data as listed     Latest Ref Rng & Units 09/21/2022    9:42 AM 08/30/2022    8:43 AM 08/23/2022    1:57 PM  CBC  WBC 4.0 - 10.5 K/uL 3.5  3.0  2.3   Hemoglobin 12.0 - 15.0 g/dL 19.1  47.8  10.1   Hematocrit 36.0 - 46.0 % 38.1  32.9  30.7   Platelets 150 - 400 K/uL 174  175  92       Latest Ref Rng & Units 09/21/2022    9:43 AM 08/23/2022    1:57 PM 06/20/2022    9:00 AM  CMP  Glucose 70 - 99 mg/dL 409  811  97   BUN 6 - 20 mg/dL 9  9  10    Creatinine 0.44 - 1.00 mg/dL 9.14  7.82  9.56   Sodium 135 - 145 mmol/L 138  138  140   Potassium 3.5 - 5.1 mmol/L 3.9  4.0  4.2   Chloride 98 - 111 mmol/L 100  104  106   CO2 22 - 32 mmol/L 24  25  24    Calcium 8.9 - 10.3 mg/dL 9.5  9.4  9.2   Total Protein 6.5 - 8.1 g/dL 7.8  7.7  7.2   Total Bilirubin 0.3 - 1.2 mg/dL 0.5  0.5  0.5   Alkaline Phos 38 - 126 U/L 66  68  68   AST 15 - 41 U/L 31  36  26   ALT 0 - 44 U/L 26  63  41     Iron/TIBC/Ferritin/  %Sat    Component Value Date/Time   IRON 162 06/20/2022 0900   TIBC 297 06/20/2022 0900   FERRITIN 317 (H) 06/20/2022 0900   IRONPCTSAT 55 (H) 06/20/2022 0900      RADIOGRAPHIC STUDIES: I have personally reviewed the radiological images as listed and agreed with the findings in the report. No results found.

## 2023-03-01 ENCOUNTER — Encounter: Payer: Self-pay | Admitting: Oncology

## 2023-06-06 ENCOUNTER — Encounter: Payer: Self-pay | Admitting: Oncology
